# Patient Record
Sex: Male | Born: 1935 | Race: White | Hispanic: No | Smoking: Never smoker
Health system: Southern US, Community
[De-identification: ages and names within clinical notes are randomized; demographics above are authoritative.]

## PROBLEM LIST (undated history)

## (undated) DIAGNOSIS — K219 Gastro-esophageal reflux disease without esophagitis: Secondary | ICD-10-CM

## (undated) DIAGNOSIS — F419 Anxiety disorder, unspecified: Secondary | ICD-10-CM

## (undated) DIAGNOSIS — K5900 Constipation, unspecified: Secondary | ICD-10-CM

## (undated) DIAGNOSIS — N185 Chronic kidney disease, stage 5: Secondary | ICD-10-CM

## (undated) DIAGNOSIS — M1990 Unspecified osteoarthritis, unspecified site: Secondary | ICD-10-CM

## (undated) DIAGNOSIS — F32A Depression, unspecified: Secondary | ICD-10-CM

## (undated) DIAGNOSIS — I272 Pulmonary hypertension, unspecified: Secondary | ICD-10-CM

## (undated) DIAGNOSIS — F329 Major depressive disorder, single episode, unspecified: Secondary | ICD-10-CM

## (undated) DIAGNOSIS — M75100 Unspecified rotator cuff tear or rupture of unspecified shoulder, not specified as traumatic: Secondary | ICD-10-CM

## (undated) DIAGNOSIS — I4510 Unspecified right bundle-branch block: Secondary | ICD-10-CM

## (undated) DIAGNOSIS — I10 Essential (primary) hypertension: Secondary | ICD-10-CM

## (undated) DIAGNOSIS — I4891 Unspecified atrial fibrillation: Secondary | ICD-10-CM

## (undated) DIAGNOSIS — K279 Peptic ulcer, site unspecified, unspecified as acute or chronic, without hemorrhage or perforation: Secondary | ICD-10-CM

## (undated) DIAGNOSIS — R339 Retention of urine, unspecified: Secondary | ICD-10-CM

## (undated) DIAGNOSIS — I639 Cerebral infarction, unspecified: Secondary | ICD-10-CM

## (undated) DIAGNOSIS — I4589 Other specified conduction disorders: Secondary | ICD-10-CM

## (undated) HISTORY — DX: Unspecified atrial fibrillation: I48.91

## (undated) HISTORY — DX: Essential (primary) hypertension: I10

## (undated) HISTORY — DX: Other specified conduction disorders: I45.89

## (undated) HISTORY — DX: Pulmonary hypertension, unspecified: I27.20

## (undated) HISTORY — DX: Unspecified right bundle-branch block: I45.10

## (undated) HISTORY — PX: TONSILLECTOMY: SUR1361

## (undated) HISTORY — DX: Peptic ulcer, site unspecified, unspecified as acute or chronic, without hemorrhage or perforation: K27.9

## (undated) HISTORY — PX: APPENDECTOMY: SHX54

## (undated) HISTORY — DX: Chronic kidney disease, stage 5: N18.5

## (undated) HISTORY — PX: KNEE SURGERY: SHX244

## (undated) HISTORY — PX: EYE SURGERY: SHX253

---

## 2008-02-22 ENCOUNTER — Ambulatory Visit (HOSPITAL_COMMUNITY): Admission: RE | Admit: 2008-02-22 | Discharge: 2008-02-22 | Payer: Self-pay | Admitting: Ophthalmology

## 2010-02-27 ENCOUNTER — Ambulatory Visit (HOSPITAL_COMMUNITY)
Admission: RE | Admit: 2010-02-27 | Discharge: 2010-02-27 | Disposition: A | Discharge status: Home / Self Care | Payer: Medicare Other | Source: Ambulatory Visit | Attending: Pulmonary Disease | Admitting: Pulmonary Disease

## 2010-02-27 ENCOUNTER — Other Ambulatory Visit (HOSPITAL_COMMUNITY): Payer: Self-pay | Admitting: Pulmonary Disease

## 2010-02-27 DIAGNOSIS — R0602 Shortness of breath: Secondary | ICD-10-CM | POA: Insufficient documentation

## 2010-02-27 DIAGNOSIS — R918 Other nonspecific abnormal finding of lung field: Secondary | ICD-10-CM | POA: Insufficient documentation

## 2010-05-28 ENCOUNTER — Ambulatory Visit (INDEPENDENT_AMBULATORY_CARE_PROVIDER_SITE_OTHER): Payer: Medicare Other | Admitting: Urology

## 2010-05-28 DIAGNOSIS — N320 Bladder-neck obstruction: Secondary | ICD-10-CM

## 2010-05-28 DIAGNOSIS — R339 Retention of urine, unspecified: Secondary | ICD-10-CM

## 2010-05-28 DIAGNOSIS — R82998 Other abnormal findings in urine: Secondary | ICD-10-CM

## 2010-05-28 DIAGNOSIS — N401 Enlarged prostate with lower urinary tract symptoms: Secondary | ICD-10-CM

## 2010-07-19 ENCOUNTER — Ambulatory Visit (HOSPITAL_COMMUNITY)
Admission: RE | Admit: 2010-07-19 | Discharge: 2010-07-19 | Disposition: A | Discharge status: Home / Self Care | Payer: Medicare Other | Source: Ambulatory Visit | Attending: Internal Medicine | Admitting: Internal Medicine

## 2010-07-19 DIAGNOSIS — I2510 Atherosclerotic heart disease of native coronary artery without angina pectoris: Secondary | ICD-10-CM | POA: Insufficient documentation

## 2010-07-19 DIAGNOSIS — I369 Nonrheumatic tricuspid valve disorder, unspecified: Secondary | ICD-10-CM

## 2010-07-19 DIAGNOSIS — R609 Edema, unspecified: Secondary | ICD-10-CM | POA: Insufficient documentation

## 2010-07-19 DIAGNOSIS — R0602 Shortness of breath: Secondary | ICD-10-CM | POA: Insufficient documentation

## 2010-12-24 ENCOUNTER — Ambulatory Visit (HOSPITAL_COMMUNITY)
Admission: RE | Admit: 2010-12-24 | Discharge: 2010-12-24 | Disposition: A | Discharge status: Home / Self Care | Payer: Medicare Other | Source: Ambulatory Visit | Attending: Internal Medicine | Admitting: Internal Medicine

## 2010-12-24 ENCOUNTER — Other Ambulatory Visit (HOSPITAL_COMMUNITY): Payer: Self-pay | Admitting: Internal Medicine

## 2010-12-24 DIAGNOSIS — R059 Cough, unspecified: Secondary | ICD-10-CM

## 2010-12-24 DIAGNOSIS — R509 Fever, unspecified: Secondary | ICD-10-CM | POA: Insufficient documentation

## 2010-12-24 DIAGNOSIS — R05 Cough: Secondary | ICD-10-CM | POA: Insufficient documentation

## 2011-01-09 ENCOUNTER — Ambulatory Visit (INDEPENDENT_AMBULATORY_CARE_PROVIDER_SITE_OTHER): Payer: Medicare Other | Admitting: Otolaryngology

## 2011-01-09 DIAGNOSIS — J310 Chronic rhinitis: Secondary | ICD-10-CM

## 2011-01-09 DIAGNOSIS — H6120 Impacted cerumen, unspecified ear: Secondary | ICD-10-CM

## 2011-01-09 DIAGNOSIS — J343 Hypertrophy of nasal turbinates: Secondary | ICD-10-CM

## 2011-02-14 DIAGNOSIS — I4891 Unspecified atrial fibrillation: Secondary | ICD-10-CM

## 2011-02-14 HISTORY — DX: Unspecified atrial fibrillation: I48.91

## 2011-02-27 ENCOUNTER — Encounter: Payer: Self-pay | Admitting: Cardiology

## 2011-02-27 DIAGNOSIS — I4510 Unspecified right bundle-branch block: Secondary | ICD-10-CM | POA: Insufficient documentation

## 2011-02-28 ENCOUNTER — Encounter: Payer: Self-pay | Admitting: Cardiology

## 2011-07-03 ENCOUNTER — Encounter (HOSPITAL_COMMUNITY): Payer: Self-pay | Admitting: Emergency Medicine

## 2011-07-03 ENCOUNTER — Emergency Department (HOSPITAL_COMMUNITY)
Admission: EM | Admit: 2011-07-03 | Discharge: 2011-07-04 | Disposition: A | Discharge status: Home / Self Care | Payer: Medicare Other | Attending: Emergency Medicine | Admitting: Emergency Medicine

## 2011-07-03 ENCOUNTER — Emergency Department (HOSPITAL_COMMUNITY): Payer: Medicare Other

## 2011-07-03 DIAGNOSIS — M25519 Pain in unspecified shoulder: Secondary | ICD-10-CM

## 2011-07-03 DIAGNOSIS — M19019 Primary osteoarthritis, unspecified shoulder: Secondary | ICD-10-CM | POA: Insufficient documentation

## 2011-07-03 DIAGNOSIS — Z87828 Personal history of other (healed) physical injury and trauma: Secondary | ICD-10-CM | POA: Insufficient documentation

## 2011-07-03 MED ORDER — HYDROMORPHONE HCL PF 1 MG/ML IJ SOLN
1.0000 mg | Freq: Once | INTRAMUSCULAR | Status: AC
  Administered 2011-07-03: 1 mg via INTRAVENOUS
  Filled 2011-07-03: qty 1

## 2011-07-03 MED ORDER — ONDANSETRON HCL 4 MG/2ML IJ SOLN
4.0000 mg | Freq: Once | INTRAMUSCULAR | Status: AC
  Administered 2011-07-03: 4 mg via INTRAVENOUS
  Filled 2011-07-03: qty 2

## 2011-07-03 NOTE — ED Notes (Signed)
Pt complains of Right shoulder pain x2 weeks. States he hurt his shoulder when he fell of a tractor and tried to get back up. States he has been seen prior and had imaging done. States the shoulder pain is a chronic issue that he keeps re aggrivating. Pt is AAx4 and NAD noted. Pain 10/10

## 2011-07-03 NOTE — ED Notes (Signed)
Right upper arm is bruised, states he has had fluid drawn off shoulder at least twice by Dr Hilda Lias, saw Dr Hilda Lias today for same and did not get any relief, unable to get an relief at home, family at bedside

## 2011-07-03 NOTE — ED Provider Notes (Signed)
History  Scribed for [employer]. Colon Branch, MD, the patient was seen in room APA19/APA19. This chart was scribed by Candelaria Stagers. The patient's care started at 11:11 PM   CSN: [id number]  Arrival date & time 07/03/11  2205   First MD Initiated Contact with Patient 07/03/11 2303      Chief Complaint  Patient presents with  . Shoulder Pain     The history is provided by the patient.   James Park is a 78 y.o. male who presents to the Emergency Department complaining of right shoulder pain after falling off a tractor three weeks ago and is experiencing worsening pain today.  Pt has been seeing Dr. Hilda Lias for this injury.  Pt saw Dr. Hilda Lias earlier today and was given tramadol which has not helped.  He then took flexural and three advil with no relief.  He is experiencing no other sx.  Pt is scheduled for an MRI later this week.    Past Medical History  Diagnosis Date  . Atrial fibrillation 02/2011    EKG from Dr. Alonza Smoker office reviewed demonstrating AF with ventricular rate of 70 bpm and occasional PVCs versus aberrancy; possible prior septal MI; low voltage, ST-T wave abnormality consistent with LVH or anterolateral ischemia  . Right bundle branch block   . Hypertension     Past Surgical History  Procedure Date  . Knee surgery     tendon repair    No family history on file.  History  Substance Use Topics  . Smoking status: Never Smoker   . Smokeless tobacco: Not on file  . Alcohol Use: Yes      Review of Systems  Constitutional: Negative for fever and diaphoresis.  HENT: Negative for neck pain and neck stiffness.   Eyes: Negative for pain.  Respiratory: Negative for cough and shortness of breath.   Cardiovascular: Negative for chest pain.  Gastrointestinal: Negative for nausea, vomiting, abdominal pain and diarrhea.  Genitourinary: Negative for dysuria.  Musculoskeletal: Positive for arthralgias (right shoulder pain).  Neurological: Negative for headaches.     Allergies  Review of patient's allergies indicates no known allergies.  Home Medications  No current outpatient prescriptions on file.  BP 217/103  Pulse 46  Temp 97.2 F (36.2 C) (Oral)  Resp 16  Ht 5\' 10"  (1.778 m)  Wt 206 lb (93.441 kg)  BMI 29.56 kg/m2  SpO2 99%  Physical Exam  Nursing note and vitals reviewed. Constitutional: He is oriented to person, place, and time. He appears well-developed and well-nourished. No distress.  HENT:  Head: Normocephalic and atraumatic.  Eyes: EOM are normal. Pupils are equal, round, and reactive to light.  Neck: Neck supple. No tracheal deviation present.  Cardiovascular: Normal rate and regular rhythm.   Pulmonary/Chest: Effort normal. No respiratory distress. He has no wheezes.  Abdominal: Soft. He exhibits no distension.  Musculoskeletal: Normal range of motion.       Small area about two cent at top of shoulder that has fresh bruising.  resolving bruises from the clavicle to the left elbow.     Neurological: He is alert and oriented to person, place, and time. No sensory deficit.  Skin: Skin is warm and dry.  Psychiatric: He has a normal mood and affect. His behavior is normal.    ED Course  Procedures   DIAGNOSTIC STUDIES: Oxygen Saturation is 99% on room air, normal by my interpretation.    COORDINATION OF CARE:  11:24PM Ordered: DG Shoulder Right  Dg  Shoulder Right  07/04/2011  *RADIOLOGY REPORT*  Clinical Data: Fall.  Right shoulder pain.  RIGHT SHOULDER - 2+ VIEW  Comparison: None.  Findings: No evidence of fracture or dislocation.  Moderate degenerative changes are seen along the acromioclavicular joint. Mild degenerative spurring also seen involving the glenohumeral joint.  IMPRESSION:  1.  No acute findings. 2.  Moderate acromioclavicular and mild glenohumeral DJD.  Original Report Authenticated By: Danae Orleans, M.D.     MDM  Patient with multiple injuries to the right shoulder recently had an acute fall with  the development of a hemi-arthrosis. He is here with shoulder pain. He is scheduled for an MRI in the next week and is followed by orthopedist Dr. Hilda Lias. Plain films of the shoulder show no fracture or dislocation but moderate acromioclavicular and glenohumeral DJD. Pain is consistent with possible rotator cuff tears. Given analgesics and anti-medic with some relief.Dx testing d/w pt and family.  Questions answered.  Verb understanding, agreeable to d/c home with outpt f/u. Pt feels improved after observation and/or treatment in ED.Pt stable in ED with no significant deterioration in condition.The patient appears reasonably screened and/or stabilized for discharge and I doubt any other medical condition or other [facility] requiring further screening, evaluation, or treatment in the ED at this time prior to discharge.  .I personally performed the services described in this documentation, which was scribed in my presence. The recorded information has been reviewed and considered.   MDM Reviewed: nursing note and vitals Interpretation: x-ray            Nicoletta Dress. Colon Branch, MD 07/04/11 [phone]

## 2011-07-04 MED ORDER — ONDANSETRON HCL 4 MG PO TABS: 4.0000 mg | ORAL_TABLET | Freq: Four times a day (QID) | ORAL | Status: AC

## 2011-07-04 MED ORDER — OXYCODONE-ACETAMINOPHEN 5-325 MG PO TABS: 1.0000 | ORAL_TABLET | ORAL | Status: AC | PRN

## 2011-07-04 NOTE — Discharge Instructions (Signed)
Your x-ray does not show any fractures or breaks in the bone. It does show degenerative disease in the shoulder. Use the pain medicine one half to one pill every 4-6 hours as needed. Followup with Dr. Hilda Lias. Let him make your MRI appointment.

## 2012-03-09 ENCOUNTER — Ambulatory Visit (HOSPITAL_COMMUNITY)
Admission: RE | Admit: 2012-03-09 | Discharge: 2012-03-09 | Disposition: A | Discharge status: Home / Self Care | Payer: Medicare Other | Source: Ambulatory Visit | Attending: Internal Medicine | Admitting: Internal Medicine

## 2012-03-09 DIAGNOSIS — I10 Essential (primary) hypertension: Secondary | ICD-10-CM | POA: Insufficient documentation

## 2012-03-09 DIAGNOSIS — M6281 Muscle weakness (generalized): Secondary | ICD-10-CM | POA: Insufficient documentation

## 2012-03-09 DIAGNOSIS — IMO0001 Reserved for inherently not codable concepts without codable children: Secondary | ICD-10-CM | POA: Insufficient documentation

## 2012-03-09 DIAGNOSIS — R262 Difficulty in walking, not elsewhere classified: Secondary | ICD-10-CM | POA: Insufficient documentation

## 2012-03-09 DIAGNOSIS — I4891 Unspecified atrial fibrillation: Secondary | ICD-10-CM | POA: Insufficient documentation

## 2012-03-09 NOTE — Evaluation (Addendum)
Physical Therapy Evaluation  Patient Details  Name: James Park MRN: [id number] Date of Birth: Feb 26, 1929 Charge:  Eval Today's Date: 03/09/2012 Time: 1478-2956 PT Time Calculation (min): 35 min  Visit#: 1 of 8     Assessment Diagnosis: L leg weakness  Authorization: medicare blue  Authorization Visit#: 1 of 8   Past Medical History:  Past Medical History  Diagnosis Date  . Atrial fibrillation 02/2011    EKG from Dr. Alonza Smoker office reviewed demonstrating AF with ventricular rate of 70 bpm and occasional PVCs versus aberrancy; possible prior septal MI; low voltage, ST-T wave abnormality consistent with LVH or anterolateral ischemia  . Right bundle branch block   . Hypertension    Past Surgical History:  Past Surgical History  Procedure Laterality Date  . Knee surgery      tendon repair    Subjective Symptoms/Limitations Symptoms: James Park states that he has noticed that his L leg has became weaker.  He states that he can tell that it is more difficult for him to get onto his horse  and onto his tractor.  HE states that he tends to take step one at time now.He denies back pain Pertinent History: 1990 pt fell and injured L knee.  How long can you walk comfortably?: The patinet states that he is not walking on a regular basis.  Walking does not seem to bother him though it is more when he as to put more effort in it.   Pain Assessment Currently in Pain?: No/denies   Sensation/Coordination/Flexibility/Functional Tests Flexibility 90/90: Positive  Assessment LLE Strength Left Hip Flexion: 5/5 Left Hip Extension: 3+/5 (B) Left Hip ABduction: 4/5 Left Hip ADduction: 5/5 Left Knee Flexion: 4/5 (B 4/5) Left Knee Extension: 3+/5 Left Ankle Dorsiflexion: 5/5 Left Ankle Plantar Flexion:  (4-/5)  Exercise/Treatments Mobility/Balance  Static Standing Balance Single Leg Stance - Right Leg: 3 Single Leg Stance - Left Leg: 1   Stretches Active Hamstring Stretch: 3  reps;30 seconds Seated Long Arc Quad: 10 reps Sidelying Hip ABduction: 10 reps Prone  Hip Extension: Both;10 reps    Physical Therapy Assessment and Plan PT Assessment and Plan Clinical Impression Statement: Pt who has decreased L LE strength and decrased balance who is noticing increased difficulty wilth activities that require him to push his body weight up.  Pt will benfit from skilled PT to improve strength and functional mobility for higher level activitiy such as climbing into a tractor. Pt will benefit from skilled therapeutic intervention in order to improve on the following deficits: Decreased balance;Decreased strength;Impaired flexibility Rehab Potential: Good PT Frequency: Min 2X/week PT Duration: 4 weeks PT Treatment/Interventions: Therapeutic exercise;Therapeutic activities;Balance training PT Plan: begin bike on L2, lateral step up, forward step up, rockerboard, terminal extension both standing and supine, L heelraise and functional squats;  3rd treatement add step downs; SLS, and wall squats.    Goals Home Exercise Program Pt will Perform Home Exercise Program: Independently PT Short Term Goals Time to Complete Short Term Goals: 2 weeks PT Short Term Goal 1: Pt to increase LE strength 1/2 grade PT Short Term Goal 2: able to take steps reciprocally PT Long Term Goals Time to Complete Long Term Goals: 4 weeks PT Long Term Goal 1: I in advance HEP PT Long Term Goal 2: able to get into a saddle without difficulty Long Term Goal 3: able to get into a tractor without difficulty  Problem List Patient Active Problem List  Diagnosis  . Atrial fibrillation  .  Right bundle branch block    General Behavior During Session: [facility] for tasks performed Cognition: [facility] for tasks performed PT Plan of Care PT Home Exercise Plan: given  [name]: clinical judgement. Functional Limitation: Changing and maintaining body position Changing and Maintaining  Body Position Current Status [phone]): At least 40 percent but less than 60 percent impaired, limited or restricted Changing and Maintaining Body Position Goal Status (A2130): At least 1 percent but less than 20 percent impaired, limited or restricted  Monico Sudduth,[name] 03/09/2012, 1:54 PM  Physician Documentation Your signature is required to indicate approval of the treatment plan as stated above.  Please sign and either send electronically or make a copy of this report for your files and return this physician signed original.   Please mark one 1.__approve of plan  2. ___approve of plan with the following conditions.   ______________________________                                                          _____________________ Physician Signature                                                                                                             Date

## 2012-03-16 ENCOUNTER — Ambulatory Visit (HOSPITAL_COMMUNITY)
Admission: RE | Admit: 2012-03-16 | Discharge: 2012-03-16 | Disposition: A | Discharge status: Home / Self Care | Payer: Medicare Other | Source: Ambulatory Visit | Attending: Internal Medicine | Admitting: Internal Medicine

## 2012-03-16 ENCOUNTER — Telehealth (HOSPITAL_COMMUNITY): Payer: Self-pay | Admitting: Physical Therapy

## 2012-03-16 DIAGNOSIS — M6281 Muscle weakness (generalized): Secondary | ICD-10-CM | POA: Insufficient documentation

## 2012-03-16 DIAGNOSIS — I10 Essential (primary) hypertension: Secondary | ICD-10-CM | POA: Insufficient documentation

## 2012-03-16 DIAGNOSIS — I4891 Unspecified atrial fibrillation: Secondary | ICD-10-CM | POA: Insufficient documentation

## 2012-03-16 DIAGNOSIS — IMO0001 Reserved for inherently not codable concepts without codable children: Secondary | ICD-10-CM | POA: Insufficient documentation

## 2012-03-16 DIAGNOSIS — R262 Difficulty in walking, not elsewhere classified: Secondary | ICD-10-CM | POA: Insufficient documentation

## 2012-03-16 NOTE — Progress Notes (Signed)
out Physical Therapy Treatment Patient Details  Name: James Park MRN: [id number] Date of Birth: Apr 04, 1929  Today's Date: 03/16/2012 Time: 4098-1191 PT Time Calculation (min): 46 min  Visit#: 2 of 88  Re-eval: 03/30/12  charge:  There ex x 42  Authorization: medicare blue  Authorization Visit#: 2 of 8   Subjective: Symptoms/Limitations Symptoms: Pt states that he has not been doing his exercises on a regular basis due to being busy.    Exercise/Treatments  Stretches Active Hamstring Stretch: 3 reps;30 seconds Aerobic Stationary Bike: 7' @ 2.0 Standing Heel Raises: 10 reps Terminal Knee Extension: 10 reps Lateral Step Up: 10 reps Forward Step Up: 10 reps Functional Squat: 10 reps Rocker Board: 1 minute Seated Long Arc Quad: Left;10 reps;Weights Long Arc Quad Weight: 3 lbs. Supine Terminal Knee Extension: 10 reps Straight Leg Raises: 10 reps Sidelying Hip ABduction: Left;10 reps;Limitations Hip ABduction Limitations: 3# Prone  Hip Extension: Both;10 reps    Physical Therapy Assessment and Plan PT Assessment and Plan Clinical Impression Statement: Pt continues to have decreased strength in L LE;  Pt encouraged to complete HEP.  Added new exercises without difficulty. Pt will benefit from skilled therapeutic intervention in order to improve on the following deficits: Decreased balance;Decreased strength;Impaired flexibility Rehab Potential: Good PT Frequency: Min 2X/week PT Duration: 4 weeks PT Treatment/Interventions: Therapeutic exercise;Therapeutic activities;Balance training PT Plan:  3rd treatement add step downs; SLS, and wall squats.    Goals  progressing  Problem List Patient Active Problem List  Diagnosis  . Atrial fibrillation  . Right bundle branch block    General Behavior During Session: [name] for tasks performed Cognition: [name] for tasks performed  [name]: clinical judgement; LEFS Changing and Maintaining  Body Position Current Status ([id number]): At least 40 percent but less than 60 percent impaired, limited or restricted  [name],[name] 03/16/2012, 3:43 PM

## 2012-03-19 ENCOUNTER — Inpatient Hospital Stay (HOSPITAL_COMMUNITY): Admission: RE | Admit: 2012-03-19 | Payer: Medicare Other | Source: Ambulatory Visit | Admitting: *Deleted

## 2012-03-23 ENCOUNTER — Ambulatory Visit (HOSPITAL_COMMUNITY)
Admission: RE | Admit: 2012-03-23 | Discharge: 2012-03-23 | Disposition: A | Discharge status: Home / Self Care | Payer: Medicare Other | Source: Ambulatory Visit | Attending: Internal Medicine | Admitting: Internal Medicine

## 2012-03-23 NOTE — Progress Notes (Signed)
Physical Therapy Treatment Patient Details  Name: James Park MRN: [id number] Date of Birth: Jun 04, 1929  Today's Date: 03/23/2012 Time: 1112-1203 PT Time Calculation (min): 51 min Charge: Therex 40', self care 8'  Visit#: 3 of 8  Re-eval: 03/30/12 Assessment Diagnosis: L leg weakness Next MD Visit: Ouida Sills- June  Authorization: medicare blue  Authorization Time Period:    Authorization Visit#: 3 of 8   Subjective: Symptoms/Limitations Symptoms: Pt reported improved confidence walking up hill this weekend, feels he is getting stronger.  Pt encouraged to increase frequency with HEP for maximum benefits Pain Assessment Currently in Pain?: No/denies  Objective:   Exercise/Treatments Stretches Active Hamstring Stretch: 3 reps;30 seconds Aerobic Stationary Bike: 6'@ 3.0 Standing Heel Raises: 15 reps;Limitations Heel Raises Limitations: toe raises 15x Lateral Step Up: Left;10 reps;Hand Hold: 1;Step Height: 4" Forward Step Up: Left;15 reps;Hand Hold: 1;Step Height: 4" Step Down: Left;10 reps;Hand Hold: 1;Step Height: 4" Wall Squat: 10 reps;5 seconds Rocker Board: 2 minutes;Limitations Rocker Board Limitations: R/L SLS: 2x 30" with 1 finger HHA Seated Other Seated Knee Exercises: STS w/o HHA Supine Bridges: 15 reps Straight Leg Raises: 15 reps    Physical Therapy Assessment and Plan PT Assessment and Plan Clinical Impression Statement: Added stair training for LE strengthening and progressed balance activities with min assistance required for LOB episodes. and vc-ing to improve spatial awareness   Pt limited by fatigue at end of activiities but no c/o pain through session.  Pt encouraged to increase frequency with HEP for maximum benefits and given new worksheet with supine and standing therex PT Plan: Continue with current POC, progress LE strength and balance     Goals    Problem List Patient Active Problem List  Diagnosis  . Atrial fibrillation  . Right bundle  branch block    PT - End of Session Activity Tolerance: Patient tolerated treatment well General Behavior During Session: [facility] for tasks performed Cognition: [facility] for tasks performed PT Plan of Care PT Home Exercise Plan: HEP given today following discussion with HEP; ex included bridging, SLR, wall slide, SLS, heel and toe raises  GP    Juel Burrow 03/23/2012, 12:08 PM

## 2012-03-26 ENCOUNTER — Ambulatory Visit (HOSPITAL_COMMUNITY)
Admission: RE | Admit: 2012-03-26 | Discharge: 2012-03-26 | Disposition: A | Discharge status: Home / Self Care | Payer: Medicare Other | Source: Ambulatory Visit | Attending: Internal Medicine | Admitting: Internal Medicine

## 2012-03-26 NOTE — Progress Notes (Signed)
Physical Therapy Treatment Patient Details  Name: REHAN HOLNESS MRN: [id number] Date of Birth: 09-29-1929  Today's Date: 03/26/2012 Time: 1102-1152 PT Time Calculation (min): 50 min Charge: therex 50'  Visit#: 4 of 8  Re-eval:   Assessment Diagnosis: L leg weakness Next MD Visit: Ouida Sills- June  Authorization: medicare blue  Authorization Time Period:    Authorization Visit#: 4 of 8   Subjective: Symptoms/Limitations Symptoms: Pt reported power outage at home, is tired today.  No c/o pain Pain Assessment Currently in Pain?: No/denies  Objective:   Exercise/Treatments Aerobic Stationary Bike: NuStep 8' @ L2 Machines for Strengthening Cybex Knee Extension: 1 PL L LE 10x Cybex Knee Flexion: 3PL L LE 10x Standing Heel Raises: 15 reps;Limitations Heel Raises Limitations: toe raises 15x Lateral Step Up: Left;15 reps;Hand Hold: 1;Step Height: 4" Forward Step Up: Left;15 reps;Hand Hold: 1;Step Height: 4" Step Down: Left;10 reps;Hand Hold: 1;Step Height: 4" Wall Squat: 10 reps;10 seconds Lunge Walking - Round Trips:    Manufacturing systems engineer: 2 minutes;Limitations Manufacturing systems engineer Limitations: R/L Other Standing Knee Exercises: heel and toe walking 1RT Other Standing Knee Exercises: chair pose 5x 10" Seated Other Seated Knee Exercises: 5 STS w/o HHA  Physical Therapy Assessment and Plan PT Assessment and Plan Clinical Impression Statement: Progressed therex for LE strengthening, added chair pose and cybex machines for quad strengthening.  Pt continues to demonstrate weak eccentric quad control while descending stairs.  Musculature fatigue noted with new activities, no c/o pain through session.   PT Plan: Continue with current POC, progress LE strength and balance     Goals    Problem List Patient Active Problem List  Diagnosis  . Atrial fibrillation  . Right bundle branch block    PT - End of Session Activity Tolerance: Patient tolerated treatment well General Behavior During  Session: [facility] for tasks performed Cognition: [facility] for tasks performed  GP    Juel Burrow 03/26/2012, 12:12 PM

## 2012-03-30 ENCOUNTER — Ambulatory Visit (HOSPITAL_COMMUNITY)
Admission: RE | Admit: 2012-03-30 | Discharge: 2012-03-30 | Disposition: A | Discharge status: Home / Self Care | Payer: Medicare Other | Source: Ambulatory Visit | Attending: Internal Medicine | Admitting: Internal Medicine

## 2012-03-30 NOTE — Progress Notes (Signed)
Physical Therapy Treatment Patient Details  Name: James Park MRN: [id number] Date of Birth: 08-Aug-1929 Charge:  There ex x 42 Today's Date: 03/30/2012 Time: 1102-1148 PT Time Calculation (min): 46 min  Visit#: 5 of 8  Re-eval: 04/07/12   Authorization: medicare blue  Authorization Time Period:    Authorization Visit#: 5 of 8  Subjective: Symptoms/Limitations Symptoms: Pt states he was very sore for over two days after last treatment.  Pt feels the step downs contributed to this.  Pain Assessment Currently in Pain?: No/denies   Exercise/Treatments Gastroc Stretch:  (slant board 30" x 3) Aerobic Stationary Bike: NuStep 8' @ L2 (Simultaneous filing. User may not have seen previous data.) Machines for Strengthening Cybex Knee Extension: 1 PL L LE x 10  Cybex Knee Flexion: 3Pl L LEx 10 Cybex Leg Press: 4 Pl L LE  x 10 Standing Heel Raises: 15 reps;Limitations Lateral Step Up: Left;10 reps;Hand Hold: 1;Step Height: 4" Forward Step Up: Left;10 reps;Hand Hold: 1;Step Height: 4" Functional Squat: 10 reps Lunge Walking - Round Trips:    Manufacturing systems engineer: 2 minutes;Limitations Manufacturing systems engineer Limitations: R/L SLS: 5 x each leg x 10" two finger hold Seated Other Seated Knee Exercises: 5 STS w/o HHA    Physical Therapy Assessment and Plan PT Assessment and Plan Clinical Impression Statement: added leg press to routine.  Pt has significant decreased balance will work on balance each treatment from now on. PT Frequency: Min 2X/week PT Treatment/Interventions: Therapeutic exercise;Therapeutic activities;Balance training PT Plan: Continue with current POC, progress LE strength and balance     Goals Home Exercise Program PT Goal: Perform Home Exercise Program - Progress: Met PT Short Term Goals PT Short Term Goal 1 - Progress: Progressing toward goal PT Short Term Goal 2 - Progress: Progressing toward goal PT Long Term Goals PT Long Term Goal 1 - Progress: Met PT Long Term Goal 2 -  Progress: Not met Long Term Goal 3 Progress: Not met  Problem List Patient Active Problem List  Diagnosis  . Atrial fibrillation  . Right bundle branch block    PT - End of Session Activity Tolerance: Patient tolerated treatment well General Behavior During Session: [facility] for tasks performed Cognition: [facility] for tasks performed  GP    Arthelia Callicott,[name] 03/30/2012, 11:55 AM

## 2012-04-02 ENCOUNTER — Ambulatory Visit (HOSPITAL_COMMUNITY)
Admission: RE | Admit: 2012-04-02 | Discharge: 2012-04-02 | Disposition: A | Discharge status: Home / Self Care | Payer: Medicare Other | Source: Ambulatory Visit | Attending: Internal Medicine | Admitting: Internal Medicine

## 2012-04-02 DIAGNOSIS — R2689 Other abnormalities of gait and mobility: Secondary | ICD-10-CM | POA: Insufficient documentation

## 2012-04-02 NOTE — Evaluation (Addendum)
Physical Therapy Evaluation  Patient Details  Name: James Park MRN: [id number] Date of Birth: [birth year] Charge:  There ex x 22; self care x 12; mm test Today's Date: 04/02/2012 Time: 1100-1152 PT Time Calculation (min): 52 min              Visit#: 6 of 8  Re-eval: 05/02/12 Assessment Diagnosis: L leg weakness Next MD Visit: Ouida Sills- June  Authorization: medicare blue    Authorization Visit#: 6 of 8   Past Medical History:  Past Medical History  Diagnosis Date  . Atrial fibrillation 02/2011    EKG from Dr. Alonza Smoker office reviewed demonstrating AF with ventricular rate of 70 bpm and occasional PVCs versus aberrancy; possible prior septal MI; low voltage, ST-T wave abnormality consistent with LVH or anterolateral ischemia  . Right bundle branch block   . Hypertension    Past Surgical History:  Past Surgical History  Procedure Laterality Date  . Knee surgery      tendon repair    Subjective Symptoms/Limitations Pertinent History: 1990 pt fell and injured L knee.  How long can you walk comfortably?: Pt states he has started to walk for exercise he is going about 1/4 a mile.   Pt was not walking for exercise. Pain Assessment Currently in Pain?: No/denies   Sensation/Coordination/Flexibility/Functional Tests Functional Tests Functional Tests: LEFS was 33 now 32/80  Assessment LLE Strength Left Hip Flexion: 5/5 Left Hip Extension: 4/5 ( was 3+/5) Left Hip ABduction:  (4+/5 was 4/5) Left Hip ADduction: 5/5 Left Knee Flexion: 5/5 (was 4/5) Left Knee Extension: 5/5 (was 3+/5) Left Ankle Dorsiflexion: 5/5 Left Ankle Plantar Flexion: 4/5 (was 4-/5)  Exercise/Treatments Mobility/Balance  Static Standing Balance Single Leg Stance - Right Leg: 5 (was 3) Single Leg Stance - Left Leg: 2 (was 1)   Balance Exercises Standing SLS: Eyes open;5 reps Tandem Gait: Forward;1 rep Retro Gait: 1 rep Sidestepping: 1 rep Heel Raises: Both;10 reps Other Standing Exercises:  functional squat no hand hold x 10 Other Standing Exercises: steps x 1 flight     Seated Other Seated Exercises: Nu step L1 6:30 Other Seated Exercises: sit to stand one hand hold x 10     Physical Therapy Assessment and Plan PT Assessment and Plan Clinical Impression Statement: Pt Lt LE strength has improved significantly but pt has not improved in balance.  Pt limitation in balance is affecting his functional ability.  Will continue to see but will change focus from strengthening to balance. Rehab Potential: Good PT Plan: Pt treatment to focus more on balance.  Next treatment begin 1-15 on foam, foam cone rotation.     Continue treatment two times a week for four weeks Goals Home Exercise Program Pt will Perform Home Exercise Program: Independently PT Goal: Perform Home Exercise Program - Progress: Met PT Short Term Goals PT Short Term Goal 1: Pt to increase LE strength 1/2 grade PT Short Term Goal 1 - Progress: Met PT Short Term Goal 2: able to take steps reciprocally PT Short Term Goal 2 - Progress: Met PT Long Term Goals Time to Complete Long Term Goals: 4 weeks PT Long Term Goal 1: I in advance HEP PT Long Term Goal 1 - Progress: Met PT Long Term Goal 2: able to get into a saddle without difficulty (unknown weather has not permitted) Long Term Goal 3: able to get into a tractor without difficulty Long Term Goal 3 Progress:  (unknown weather has not permitted)  Problem List Patient Active Problem  List  Diagnosis  . Atrial fibrillation  . Right bundle branch block  . Balance problem    PT - End of Session Equipment Utilized During Treatment: Gait belt PT Plan of Care PT Patient Instructions: spoke to patient about increasing distance walked as pt is only walking about 1/4 mile.  Spoke to pt about focusing on SLS, sit to stand at bed and minisquats making sure pt did not come down far enough to cause his R knee to begin to hurt.  [name]:  clinical judgement; LEFS Functional Limitation: Changing and maintaining body position Changing and Maintaining Body Position Current Status (Z6109): At least 40 percent but less than 60 percent impaired, limited or restricted Changing and Maintaining Body Position Goal Status (U0454): At least 1 percent but less than 20 percent impaired, limited or restricted  [name],[name] 04/02/2012, 12:05 PM  Physician Documentation Your signature is required to indicate approval of the treatment plan as stated above.  Please sign and either send electronically or make a copy of this report for your files and return this physician signed original.   Please mark one 1.__approve of plan  2. ___approve of plan with the following conditions.   ______________________________                                                          _____________________ Physician Signature                                                                                                             Date

## 2012-04-06 ENCOUNTER — Ambulatory Visit (HOSPITAL_COMMUNITY)
Admission: RE | Admit: 2012-04-06 | Discharge: 2012-04-06 | Disposition: A | Discharge status: Home / Self Care | Payer: Medicare Other | Source: Ambulatory Visit | Attending: Internal Medicine | Admitting: Internal Medicine

## 2012-04-06 NOTE — Progress Notes (Signed)
Physical Therapy Treatment Patient Details  Name: James Park MRN: [id number] Date of Birth: [birth year]  Today's Date: 04/06/2012 Time: 4098-1191 PT Time Calculation (min): 49 min  Visit#: 7 of 16  Re-eval: 05/02/12 Authorization: medicare blue  Authorization Visit#: 7 of 16  Charges:  therex 26', NMR 18'  Subjective: Symptoms/Limitations Symptoms: Pt. states he is doing well today.  Reports no pain today and has been compliant with HEP. Pain Assessment Currently in Pain?: No/denies   Exercise/Treatments Balance Exercises Standing SLS: Eyes open;5 reps Tandem Gait: Forward;2 reps Retro Gait: 2 reps Numbers 1-15: Foam;1 rep Heel Raises: Both;10 reps;Limitations Heel Raises Limitations: no UE's Toe Raise: 10 reps;Limitations Toe Raise Limitations: no UE's Sit to Stand: Standard surface;Limitations Sit to Stand Limitations: 7X no UE's Other Standing Exercises: functional squat no hand hold x 10 Other Standing Exercises: steps x 1 flight  Seated Other Seated Exercises: Nu step L2 6:30, seat 10    Physical Therapy Assessment and Plan PT Assessment and Plan Clinical Impression Statement: Progressed balance activities with tandem being most difficult.  Standing number retrieval on balance beam also challenging.  Pt. required several rest breaks during session due to fatigue.  No significant LOB and able to self correct. PT Plan: Continue treatment to focus more on balance.  Next treatment add foam cone rotation.       Problem List Patient Active Problem List  Diagnosis  . Atrial fibrillation  . Right bundle branch block  . Balance problem      Lurena Nida, PTA/CLT 04/06/2012, 12:08 PM

## 2012-04-09 ENCOUNTER — Ambulatory Visit (HOSPITAL_COMMUNITY)
Admission: RE | Admit: 2012-04-09 | Discharge: 2012-04-09 | Disposition: A | Discharge status: Home / Self Care | Payer: Medicare Other | Source: Ambulatory Visit | Attending: Internal Medicine | Admitting: Internal Medicine

## 2012-04-09 DIAGNOSIS — R2689 Other abnormalities of gait and mobility: Secondary | ICD-10-CM

## 2012-04-09 NOTE — Progress Notes (Signed)
Physical Therapy Treatment Patient Details  Name: James Park MRN: [id number] Date of Birth: June 16, 1929 Charge:  NMR x 29; There ex 10 Today's Date: 04/09/2012 Time: 4098-1191 PT Time Calculation (min): 42 min  Visit#: 8 of 16  Re-eval: 04/30/12    Authorization: medicare blue  Authorization Visit#: 8 of 16   Subjective: Symptoms/Limitations Symptoms: Pt states he is amazed at how weak his legs are.  Exercise/Treatments   Standing SLS: Eyes open;5 reps Tandem Gait: Forward;2 reps Retro Gait: 2 reps Sidestepping: 2 reps;Theraband Theraband Level (Sidestepping): Level 4 (Blue) Numbers 1-15: Foam;1 rep Cone Rotation: Foam;Right turn;Left turn Marching: 10 reps Other Standing Exercises: functional squat no hand hold x 10 Other Standing Exercises: steps x 1 flight     Seated Other Seated Exercises: Nu step L2 6:30, seat 10    Physical Therapy Assessment and Plan PT Assessment and Plan Clinical Impression Statement: Pt needs contact guard assist to complete balance activities.  Pt needs verbal cuing to keep shoulders from going back with new marching exercise.  Added cone rotation without difficulty.  Pt only needed one rest break today. Pt will benefit from skilled therapeutic intervention in order to improve on the following deficits: Decreased balance;Decreased strength;Impaired flexibility PT Frequency: Min 2X/week PT Duration: 4 weeks PT Treatment/Interventions: Therapeutic exercise;Therapeutic activities;Balance training PT Plan: add wall bumps next treatment.       Problem List Patient Active Problem List  Diagnosis  . Atrial fibrillation  . Right bundle branch block  . Balance problem    PT - End of Session Equipment Utilized During Treatment: Gait belt Activity Tolerance: Patient tolerated treatment well General Behavior During Session: [facility] for tasks performed Cognition: [facility] for tasks performed  [name]: clinical  judgement; LEFS  [name],[name] 04/09/2012, 9:51 AM

## 2012-04-13 ENCOUNTER — Ambulatory Visit (HOSPITAL_COMMUNITY)
Admission: RE | Admit: 2012-04-13 | Discharge: 2012-04-13 | Disposition: A | Discharge status: Home / Self Care | Payer: Medicare Other | Source: Ambulatory Visit | Attending: Internal Medicine | Admitting: Internal Medicine

## 2012-04-13 ENCOUNTER — Ambulatory Visit (HOSPITAL_COMMUNITY): Payer: Medicare Other | Admitting: Physical Therapy

## 2012-04-13 DIAGNOSIS — I10 Essential (primary) hypertension: Secondary | ICD-10-CM | POA: Insufficient documentation

## 2012-04-13 DIAGNOSIS — I4891 Unspecified atrial fibrillation: Secondary | ICD-10-CM | POA: Insufficient documentation

## 2012-04-13 DIAGNOSIS — R2689 Other abnormalities of gait and mobility: Secondary | ICD-10-CM

## 2012-04-13 DIAGNOSIS — R262 Difficulty in walking, not elsewhere classified: Secondary | ICD-10-CM | POA: Insufficient documentation

## 2012-04-13 DIAGNOSIS — M6281 Muscle weakness (generalized): Secondary | ICD-10-CM | POA: Insufficient documentation

## 2012-04-13 DIAGNOSIS — IMO0001 Reserved for inherently not codable concepts without codable children: Secondary | ICD-10-CM | POA: Insufficient documentation

## 2012-04-13 NOTE — Progress Notes (Signed)
Physical Therapy Treatment Patient Details  Name: James Park MRN: [id number] Date of Birth: 1929/04/18  Today's Date: 04/13/2012 Time: 4696-2952 PT Time Calculation (min): 55 min Charge: therex 23', NMR 30'  Visit#: 9 of 1  Re-eval: 04/30/12    Authorization: medicare blue  Authorization Time Period:    Authorization Visit#: 9 of 16   Subjective: Symptoms/Limitations Symptoms: Pt stated he was tired today, has difficulty sleeping last night.  No pain. Pain Assessment Currently in Pain?: No/denies  Objective:   Exercise/Treatments Aerobic Stationary Bike: NuStep 10' @ L2 SPM >100 Standing Heel Raises: Limitations Heel Raises Limitations: heel and toe walking 2RT Functional Squat: 15 reps SLS: 5 x each leg x 10" two finger hold   Balance Exercises Standing Wall Bumps: Shoulder;Hips;15 reps Tandem Gait: Forward;2 reps Retro Gait: 2 reps Numbers 1-15: Foam;1 rep Cone Rotation: Foam;Right turn;Left turn Marching: 10 reps   Physical Therapy Assessment and Plan PT Assessment and Plan Clinical Impression Statement: Added wall bumps to improve proprioception for improve finding COG, pt required min assistance with shoulder wall bumps.  Pt with CGA with balance activities and vc-ing to improve spatial awareness.  Pt limited by fatigue but did not require any rest breaks today. PT Plan: Continue with current POC.  Resume cybex machines next session for strengthening and progress to dynamic surfaces when ready.    Goals    Problem List Patient Active Problem List  Diagnosis  . Atrial fibrillation  . Right bundle branch block  . Balance problem    PT - End of Session Equipment Utilized During Treatment: Gait belt Activity Tolerance: Patient tolerated treatment well;Patient limited by fatigue General Behavior During Session: [facility] for tasks performed Cognition: [facility] for tasks performed  GP    Juel Burrow 04/13/2012, 5:08 PM

## 2012-04-16 ENCOUNTER — Ambulatory Visit (HOSPITAL_COMMUNITY): Payer: Medicare Other | Admitting: *Deleted

## 2012-04-16 ENCOUNTER — Ambulatory Visit (HOSPITAL_COMMUNITY)
Admission: RE | Admit: 2012-04-16 | Discharge: 2012-04-16 | Disposition: A | Discharge status: Home / Self Care | Payer: Medicare Other | Source: Ambulatory Visit | Attending: Internal Medicine | Admitting: Internal Medicine

## 2012-04-16 NOTE — Progress Notes (Signed)
Physical Therapy Treatment Patient Details  Name: AYDYN TESTERMAN MRN: [id number] Date of Birth: 01-14-1930  Today's Date: 04/16/2012 Time: 7829-5621 PT Time Calculation (min): 43 min  Visit#: 10 of 16  Re-eval: 04/30/12 Charges: Therex x 40'  Authorization: medicare blue  Authorization Visit#: 10 of 16   Subjective: Symptoms/Limitations Symptoms: Pt reports continued HEP compliance. Pain Assessment Currently in Pain?: No/denies   Exercise/Treatments Machines for Strengthening Cybex Knee Extension: 1 PL L LE x 15 Cybex Knee Flexion: 3Pl L LEx 15 Standing Heel Raises Limitations: heel and toe walking 2RT Functional Squat: 15 reps Rocker Board: 2 minutes;Limitations SLS: 5 x each leg x 10" two finger hold Wall bumps shoulders and hips x 10 each   Physical Therapy Assessment and Plan PT Assessment and Plan Clinical Impression Statement: Resumed LE strengthening on cybex this session with visible mm fatigue. Pt require multimodal cueing with functional squats to improve form. Pt requires one seated rest break during session. Pt reports 0/10 pain at end of session. PT Plan: Continue to progress balance, activity tolerance, and LE strength per PT POC.     Problem List Patient Active Problem List  Diagnosis  . Atrial fibrillation  . Right bundle branch block  . Balance problem    PT - End of Session Equipment Utilized During Treatment: Gait belt Activity Tolerance: Patient tolerated treatment well;Patient limited by fatigue General Behavior During Session: [facility] for tasks performed Cognition: [facility] for tasks performed  Seth Bake, PTA  04/16/2012, 12:41 PM

## 2012-04-20 ENCOUNTER — Ambulatory Visit (HOSPITAL_COMMUNITY): Payer: Medicare Other | Admitting: *Deleted

## 2012-04-20 ENCOUNTER — Ambulatory Visit (HOSPITAL_COMMUNITY)
Admission: RE | Admit: 2012-04-20 | Discharge: 2012-04-20 | Disposition: A | Discharge status: Home / Self Care | Payer: Medicare Other | Source: Ambulatory Visit | Attending: Internal Medicine | Admitting: Internal Medicine

## 2012-04-20 DIAGNOSIS — R2689 Other abnormalities of gait and mobility: Secondary | ICD-10-CM

## 2012-04-20 NOTE — Progress Notes (Signed)
Physical Therapy Treatment Patient Details  Name: James Park MRN: [id number] Date of Birth: 10-02-1929  Today's Date: 04/20/2012 Time: 4098-1191 PT Time Calculation (min): 42 min Charge: Manual 15', Gait 15', therex 10'  Visit#: 11 of 16  Re-eval: 04/30/12    Authorization: medicare blue  Authorization Time Period:    Authorization Visit#: 11 of 16   Subjective: Symptoms/Limitations Symptoms: Pt reported he couldn't walk over weekend, Lt knee pain currently 7/10 with analgic gait mechanics at entrance.   Pain Assessment Currently in Pain?: Yes Pain Score:   7 Pain Location: Knee Pain Orientation: Left  Objective:  Exercise/Treatments Stretches Passive Hamstring Stretch: 3 reps;30 seconds Gastroc Stretch: 3 reps;30 seconds;Other (comment) (slant board) Aerobic Tread Mill: Gait trainer x 8 min total w/ concentration on heel to toe and toe push off;  4'30" rest break then 3'30" Standing Gait Training: 6 RT with concentration on heel to toe and toe push off   Manual Therapy Manual Therapy: Joint mobilization Joint Mobilization: patella mobs all direction, tib/fib, Lt knee distraction with MFR to lateral knee  Physical Therapy Assessment and Plan PT Assessment and Plan Clinical Impression Statement: Manual techniques initial this session for improved joint mobility and pain reduction.  Session focus on improving gait mechanics, pt with multimodal cueing for increased stirde length, heel to toe pattern and toe push off.  End of session pt reported no real pain just faitgue. PT Plan: Next session begin on TM for improved gait mechanics,  Continue to progress balance, activity tolerance, and LE strength per PT POC.    Goals    Problem List Patient Active Problem List  Diagnosis  . Atrial fibrillation  . Right bundle branch block  . Balance problem    PT - End of Session Activity Tolerance: Patient tolerated treatment well General Behavior During Session: [facility]  for tasks performed Cognition: [facility] for tasks performed  GP    Juel Burrow 04/20/2012, 3:01 PM

## 2012-04-23 ENCOUNTER — Ambulatory Visit (HOSPITAL_COMMUNITY): Payer: Medicare Other

## 2012-04-23 ENCOUNTER — Ambulatory Visit (HOSPITAL_COMMUNITY): Payer: Medicare Other | Admitting: Physical Therapy

## 2012-04-27 ENCOUNTER — Ambulatory Visit (HOSPITAL_COMMUNITY): Payer: Medicare Other

## 2012-04-27 ENCOUNTER — Ambulatory Visit (HOSPITAL_COMMUNITY): Payer: Medicare Other | Admitting: *Deleted

## 2012-04-30 ENCOUNTER — Ambulatory Visit (HOSPITAL_COMMUNITY): Payer: Medicare Other

## 2012-05-04 ENCOUNTER — Ambulatory Visit (HOSPITAL_COMMUNITY): Payer: Medicare Other | Admitting: Physical Therapy

## 2012-05-07 ENCOUNTER — Ambulatory Visit (HOSPITAL_COMMUNITY): Payer: Medicare Other | Admitting: Physical Therapy

## 2012-05-13 DIAGNOSIS — I639 Cerebral infarction, unspecified: Secondary | ICD-10-CM

## 2012-05-13 HISTORY — DX: Cerebral infarction, unspecified: I63.9

## 2012-06-10 ENCOUNTER — Other Ambulatory Visit (HOSPITAL_COMMUNITY): Payer: Self-pay | Admitting: Internal Medicine

## 2012-06-10 ENCOUNTER — Ambulatory Visit (HOSPITAL_COMMUNITY)
Admission: RE | Admit: 2012-06-10 | Discharge: 2012-06-10 | Disposition: A | Discharge status: Home / Self Care | Payer: Medicare Other | Source: Ambulatory Visit | Attending: Internal Medicine | Admitting: Internal Medicine

## 2012-06-10 DIAGNOSIS — I639 Cerebral infarction, unspecified: Secondary | ICD-10-CM

## 2012-06-11 ENCOUNTER — Encounter (HOSPITAL_COMMUNITY): Payer: Self-pay | Admitting: *Deleted

## 2012-06-11 ENCOUNTER — Emergency Department (HOSPITAL_COMMUNITY): Payer: Medicare Other

## 2012-06-11 ENCOUNTER — Inpatient Hospital Stay (HOSPITAL_COMMUNITY)
Admission: EM | Admit: 2012-06-11 | Discharge: 2012-06-13 | DRG: 682 | Disposition: A | Discharge status: Home / Self Care | Payer: Medicare Other | Attending: Internal Medicine | Admitting: Internal Medicine

## 2012-06-11 DIAGNOSIS — Y92009 Unspecified place in unspecified non-institutional (private) residence as the place of occurrence of the external cause: Secondary | ICD-10-CM

## 2012-06-11 DIAGNOSIS — S41109A Unspecified open wound of unspecified upper arm, initial encounter: Secondary | ICD-10-CM | POA: Diagnosis present

## 2012-06-11 DIAGNOSIS — N179 Acute kidney failure, unspecified: Secondary | ICD-10-CM

## 2012-06-11 DIAGNOSIS — N390 Urinary tract infection, site not specified: Secondary | ICD-10-CM

## 2012-06-11 DIAGNOSIS — I4891 Unspecified atrial fibrillation: Secondary | ICD-10-CM | POA: Diagnosis present

## 2012-06-11 DIAGNOSIS — R29898 Other symptoms and signs involving the musculoskeletal system: Secondary | ICD-10-CM | POA: Diagnosis present

## 2012-06-11 DIAGNOSIS — I619 Nontraumatic intracerebral hemorrhage, unspecified: Secondary | ICD-10-CM

## 2012-06-11 DIAGNOSIS — I4510 Unspecified right bundle-branch block: Secondary | ICD-10-CM

## 2012-06-11 DIAGNOSIS — I129 Hypertensive chronic kidney disease with stage 1 through stage 4 chronic kidney disease, or unspecified chronic kidney disease: Secondary | ICD-10-CM | POA: Diagnosis present

## 2012-06-11 DIAGNOSIS — I10 Essential (primary) hypertension: Secondary | ICD-10-CM

## 2012-06-11 DIAGNOSIS — W19XXXA Unspecified fall, initial encounter: Secondary | ICD-10-CM | POA: Diagnosis present

## 2012-06-11 DIAGNOSIS — Z9119 Patient's noncompliance with other medical treatment and regimen: Secondary | ICD-10-CM

## 2012-06-11 DIAGNOSIS — N289 Disorder of kidney and ureter, unspecified: Secondary | ICD-10-CM

## 2012-06-11 DIAGNOSIS — I639 Cerebral infarction, unspecified: Secondary | ICD-10-CM

## 2012-06-11 DIAGNOSIS — Z91199 Patient's noncompliance with other medical treatment and regimen due to unspecified reason: Secondary | ICD-10-CM

## 2012-06-11 DIAGNOSIS — N189 Chronic kidney disease, unspecified: Secondary | ICD-10-CM | POA: Diagnosis present

## 2012-06-11 HISTORY — DX: Cerebral infarction, unspecified: I63.9

## 2012-06-11 LAB — URINALYSIS, ROUTINE W REFLEX MICROSCOPIC
Glucose, UA: 100 mg/dL — AB
Ketones, ur: NEGATIVE mg/dL
Nitrite: POSITIVE — AB
Specific Gravity, Urine: 1.015 (ref 1.005–1.030)
pH: 6 (ref 5.0–8.0)

## 2012-06-11 LAB — COMPREHENSIVE METABOLIC PANEL
AST: 30 U/L (ref 0–37)
Albumin: 3.4 g/dL — ABNORMAL LOW (ref 3.5–5.2)
BUN: 116 mg/dL — ABNORMAL HIGH (ref 6–23)
Calcium: 8.7 mg/dL (ref 8.4–10.5)
Creatinine, Ser: 5.64 mg/dL — ABNORMAL HIGH (ref 0.50–1.35)

## 2012-06-11 LAB — URINE MICROSCOPIC-ADD ON

## 2012-06-11 LAB — CBC WITH DIFFERENTIAL/PLATELET
Basophils Relative: 1 % (ref 0–1)
Eosinophils Relative: 6 % — ABNORMAL HIGH (ref 0–5)
Hemoglobin: 12.7 g/dL — ABNORMAL LOW (ref 13.0–17.0)
MCH: 28.7 pg (ref 26.0–34.0)
MCV: 85.1 fL (ref 78.0–100.0)
Monocytes Absolute: 0.5 10*3/uL (ref 0.1–1.0)
Monocytes Relative: 6 % (ref 3–12)
Neutrophils Relative %: 74 % (ref 43–77)
Platelets: 166 10*3/uL (ref 150–400)
RBC: 4.43 MIL/uL (ref 4.22–5.81)
WBC: 8.5 10*3/uL (ref 4.0–10.5)

## 2012-06-11 LAB — TROPONIN I: Troponin I: <0.3 ng/mL (ref <0.30)

## 2012-06-11 MED ORDER — DEXTROSE 5 % IV SOLN
1.0000 g | Freq: Once | INTRAVENOUS | Status: AC
  Administered 2012-06-11: 1 g via INTRAVENOUS
  Filled 2012-06-11: qty 10

## 2012-06-11 MED ORDER — ONDANSETRON HCL 4 MG/2ML IJ SOLN: 4.0000 mg | Freq: Four times a day (QID) | INTRAMUSCULAR | Status: DC | PRN

## 2012-06-11 MED ORDER — ALBUTEROL SULFATE (5 MG/ML) 0.5% IN NEBU
2.5000 mg | INHALATION_SOLUTION | RESPIRATORY_TRACT | Status: DC | PRN
  Administered 2012-06-12 – 2012-06-13 (×2): 2.5 mg via RESPIRATORY_TRACT
  Filled 2012-06-11 (×2): qty 0.5

## 2012-06-11 MED ORDER — TRAZODONE HCL 50 MG PO TABS
50.0000 mg | ORAL_TABLET | Freq: Every evening | ORAL | Status: DC | PRN
  Administered 2012-06-11: 50 mg via ORAL
  Filled 2012-06-11: qty 1

## 2012-06-11 MED ORDER — DEXTROSE 5 % IV SOLN
1.0000 g | INTRAVENOUS | Status: DC
  Administered 2012-06-12: 1 g via INTRAVENOUS
  Filled 2012-06-11 (×2): qty 10

## 2012-06-11 MED ORDER — SODIUM CHLORIDE 0.9 % IV SOLN
INTRAVENOUS | Status: DC
  Administered 2012-06-11 – 2012-06-13 (×4): via INTRAVENOUS

## 2012-06-11 MED ORDER — LABETALOL HCL 5 MG/ML IV SOLN
10.0000 mg | INTRAVENOUS | Status: DC | PRN
  Filled 2012-06-11: qty 4

## 2012-06-11 MED ORDER — ACETAMINOPHEN 325 MG PO TABS: 650.0000 mg | ORAL_TABLET | ORAL | Status: DC | PRN

## 2012-06-11 MED ORDER — ALBUTEROL SULFATE (5 MG/ML) 0.5% IN NEBU
2.5000 mg | INHALATION_SOLUTION | Freq: Once | RESPIRATORY_TRACT | Status: DC
  Filled 2012-06-11: qty 0.5

## 2012-06-11 MED ORDER — SODIUM CHLORIDE 0.9 % IV BOLUS (SEPSIS): 500.0000 mL | Freq: Once | INTRAVENOUS | Status: DC

## 2012-06-11 MED ORDER — HYDRALAZINE HCL 20 MG/ML IJ SOLN
10.0000 mg | INTRAMUSCULAR | Status: DC | PRN
  Administered 2012-06-11 – 2012-06-13 (×5): 10 mg via INTRAVENOUS
  Filled 2012-06-11 (×5): qty 1

## 2012-06-11 MED ORDER — ACETAMINOPHEN 650 MG RE SUPP: 650.0000 mg | RECTAL | Status: DC | PRN

## 2012-06-11 MED ORDER — PANTOPRAZOLE SODIUM 40 MG IV SOLR
40.0000 mg | Freq: Every day | INTRAVENOUS | Status: DC
  Administered 2012-06-11 – 2012-06-12 (×2): 40 mg via INTRAVENOUS
  Filled 2012-06-11 (×2): qty 40

## 2012-06-11 MED ORDER — SODIUM CHLORIDE 0.9 % IV BOLUS (SEPSIS): 1000.0000 mL | Freq: Once | INTRAVENOUS | Status: DC

## 2012-06-11 MED ORDER — SODIUM CHLORIDE 0.9 % IV BOLUS (SEPSIS)
1000.0000 mL | Freq: Once | INTRAVENOUS | Status: AC
  Administered 2012-06-11: 1000 mL via INTRAVENOUS

## 2012-06-11 MED ORDER — SENNOSIDES-DOCUSATE SODIUM 8.6-50 MG PO TABS
1.0000 | ORAL_TABLET | Freq: Two times a day (BID) | ORAL | Status: DC
  Administered 2012-06-13: 1 via ORAL
  Filled 2012-06-11 (×3): qty 1

## 2012-06-11 NOTE — ED Provider Notes (Signed)
History     CSN: [id number]  Arrival date & time 06/11/12  1214   First MD Initiated Contact with Patient 06/11/12 1249      Chief Complaint  Patient presents with  . Weakness    (Consider location/radiation/quality/duration/timing/severity/associated sxs/prior treatment) HPI.... level V caveat for urgent need for intervention.    Patient felt weak on Sunday and slumped to the floor laying there forapproximately 13 hours.   Patient refused medical care at that time.   He was eventually evaluated by his primary care physician Dr. Ouida Sills.  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  Additionally his creatinine was elevated.  Patient agreed to be seen in the emergency department for further evaluation  Past Medical History  Diagnosis Date  . Atrial fibrillation 02/2011    EKG from Dr. Alonza Smoker office reviewed demonstrating AF with ventricular rate of 70 bpm and occasional PVCs versus aberrancy; possible prior septal MI; low voltage, ST-T wave abnormality consistent with LVH or anterolateral ischemia  . Right bundle branch block   . Hypertension   . Stroke     Past Surgical History  Procedure Laterality Date  . Knee surgery      tendon repair  . Prostate enlargement.  self caths at times      History reviewed. No pertinent family history.  History  Substance Use Topics  . Smoking status: Never Smoker   . Smokeless tobacco: Not on file  . Alcohol Use: Yes      Review of Systems  Unable to perform ROS   Allergies  Review of patient's allergies indicates no known allergies.  Home Medications  No current outpatient prescriptions on file.  BP 184/93  Pulse 55  Temp(Src) 97.7 F (36.5 C) (Axillary)  Resp 20  SpO2 98%  Physical Exam  Nursing note and vitals reviewed. Constitutional:  Respond sluggishly to questions but reasonably appropriately  HENT:  Head: Normocephalic and atraumatic.  Eyes: Conjunctivae and EOM are normal. Pupils are equal, round,  and reactive to light.  Neck: Normal range of motion. Neck supple.  Cardiovascular: Normal rate, regular rhythm and normal heart sounds.   Pulmonary/Chest: Effort normal and breath sounds normal.  Abdominal: Soft. Bowel sounds are normal.  Musculoskeletal: Normal range of motion.  Neurological: He is alert.  Legs are weak  Skin: Skin is warm and dry.  Psychiatric: He has a normal mood and affect.    ED Course  Procedures (including critical care time)  Labs Reviewed  CBC WITH DIFFERENTIAL - Abnormal; Notable for the following:    Hemoglobin 12.7 (*)    HCT 37.7 (*)    All other components within normal limits  COMPREHENSIVE METABOLIC PANEL  URINALYSIS, ROUTINE W REFLEX MICROSCOPIC  TROPONIN I   Mr Brain Wo Contrast  06/10/2012   *RADIOLOGY REPORT*  Clinical Data: Right-sided weakness.  MRI HEAD WITHOUT CONTRAST  Technique:  Multiplanar, multiecho pulse sequences of the brain and surrounding structures were obtained according to standard protocol without intravenous contrast.  Comparison: None.  Findings: There is an intraparenchymal hemorrhage in the left lateral thalamus/ cortical spinal tract region measuring 1 cm in diameter, associated with vasogenic edema.  Diffusion imaging otherwise is technically unsuccessful.  I do not identify any other acute infarction.  There are chronic small vessel changes within the pons.  There is an old small vessel infarction in the right thalamus.  There are moderate chronic small vessel changes throughout the cerebral hemispheric white matter.  No mass lesion, hydrocephalus  or extra- axial collection.  No pituitary mass.  No fluid in the sinuses.  IMPRESSION: Acute to subacute 1 cm hemorrhage in the left lateral thalamus/cortical spinal tract region with surrounding edema.  Critical Value/emergent results were called by telephone at the time of interpretation on 06/10/2012 at 1810 hours to Dr. Ouida Sills, who verbally acknowledged these results.   Original  Report Authenticated By: Paulina Fusi, M.D.     No diagnosis found.   Date: 06/11/2012  Rate: 55  Rhythm: atrial fibrillation  QRS Axis: normal  Intervals: normal  ST/T Wave abnormalities: normal  Conduction Disutrbances:right bundle branch block  Narrative Interpretation:   Old EKG Reviewed: none available PVC     CRITICAL CARE Performed by: Donnetta Hutching Total critical care time: 30 Critical care time was exclusive of separately billable procedures and treating other patients. Critical care was necessary to treat or prevent imminent or life-threatening deterioration. Critical care was time spent personally by me on the following activities: development of treatment plan with patient and/or surrogate as well as nursing, discussions with consultants, evaluation of patient's response to treatment, examination of patient, obtaining history from patient or surrogate, ordering and performing treatments and interventions, ordering and review of laboratory studies, ordering and review of radiographic studies, pulse oximetry and re-evaluation of patient's condition. MDM  Left thalmic stroke noted. Additionally patient has elevated creatinine and a urinary tract infection. Rocephin 1 g IV initiated. IV hydration. Discussed with hospitalist Dr Irene Limbo.  He will see patient       Donnetta Hutching, MD 06/11/12 [phone]

## 2012-06-11 NOTE — ED Notes (Signed)
Pt reports to ED with weakness, sleepiness, and "dehydration" per daughter, and worsening kidney function. Daughter states, "All he needs is an IV, 2 bags of fluid, and then he'll go." Pt with stroke diagnosed last night, fall at home on Sunday for 13 hours, unsteady gait. Pt serious condition discussed with daughter and patient. No evidence of understanding. Daughter states "he's going to do what he wants." Awaiting orders.

## 2012-06-11 NOTE — H&P (Signed)
History and Physical  James Park:[id number] DOB: 1929-07-29 DOA: 06/11/2012  Referring physician: Donnetta Hutching, MD PCP: Carylon Perches, MD   Chief Complaint: Abnormal blood tests  HPI:  79 year old man presented to the emergency department on the advice of his primary care physician with history of abnormal blood work. Repeat blood work confirmed acute renal failure and revealed UTI. Additionally, patient recently suffered an ICH. Patient family refused transfer and was very reluctant to even be admitted to [facility].  History obtained from patient, daughter, EDP. Additionally EDP discussed with PCP by report. Currently unable to reach PCP. 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 He has a history of progressive right leg weakness which is chronic as well as chronic right shoulder pain from rotator cuff injury. Otherwise he does quite well but his history is notable for noncompliance, he refuses to take Lasix and has never been on any treatment for atrial fibrillation secondary to noncompliance.  5/25 patient awoke at 5 in the morning and went to the bathroom to brush his teeth. Afterwards he suddenly became rigid and was unable to walk. He never lost consciousness or passed out. There was no seizure activity. He was eventually able to lower himself to the floor where he laid for 13 hours until his daughter discovered him at approximately 6 PM. She and her husband were able to assisted to the chair. EMS was called the patient refused transport. He was noted to be generally weak, to have great difficulty with ADLs and walking. Daughter is a Engineer, civil (consulting) and arranged private aide, walker and assistance with ADLs as the patient refused to seek hospital evaluation. He is gradually improved over the last several days with increasing ability to ambulate and perform his own ADLs. His appetite has been good and he has been drinking  fluids well by report.  He saw his primary care physician in the office 5/29 at which time blood work was obtained and an MRI of the brain was ordered. This revealed acute to subacute 1 cm hemorrhage in the left lateral thalamus/cortical spinal tract region with surrounding edema. Patient refused to go to the hospital last night. This morning blood work resulted in his primary care physician contacted him by telephone and persuaded him to come to the emergency department for evaluation IV fluids. The patient's expectation at that time was to receive IV fluids and go home. EDP was able to persuade him to stay overnight. The patient remains absolutely adamant that he will go home tomorrow and would rather go home right now to be transferred to another facility.  Review of Systems:  Negative for fever, visual changes, sore throat, rash, new muscle aches, chest pain, SOB, dysuria, bleeding, n/v/abdominal pain. No dysarthria, dysphagia or new weakness. No paresthesias.  Past Medical History  Diagnosis Date  . Atrial fibrillation 02/2011    EKG from Dr. Alonza Smoker office reviewed demonstrating AF with ventricular rate of 70 bpm and occasional PVCs versus aberrancy; possible prior septal MI; low voltage, ST-T wave abnormality consistent with LVH or anterolateral ischemia  . Right bundle branch block   . Hypertension   . Stroke     Past Surgical History  Procedure Laterality Date  . Knee surgery      tendon repair  . Prostate enlargement.  self caths at times      Social History:  reports that he has never smoked. He does not  have any smokeless tobacco history on file. He reports that  drinks alcohol. He reports that he does not use illicit drugs.  Allergies  Allergen Reactions  . Altace (Ramipril)     hives    Family History  Problem Relation Age of Onset  . Parkinson's disease Brother      Prior to Admission medications   Medication Sig Start Date End Date Taking? Authorizing Provider   albuterol (PROVENTIL) (2.5 MG/3ML) 0.083% nebulizer solution Take 2.5 mg by nebulization every 6 (six) hours as needed for wheezing or shortness of breath.   Yes Historical Provider, MD  furosemide (LASIX) 20 MG tablet Take 20 mg by mouth 2 (two) times daily.   Yes Historical Provider, MD  hydrocortisone cream 0.5 % Apply 1 application topically 2 (two) times daily as needed. itching   Yes Historical Provider, MD   Physical Exam: Filed Vitals:   06/11/12 1241 06/11/12 1253 06/11/12 1303  BP: 186/89  184/93  Pulse: 88  55  Temp:  97.7 F (36.5 C)   TempSrc:  Axillary   Resp: 20    SpO2: 98%  98%    General: Examined in the emergency department. Appears calm and comfortable. Speech fluent and clear. Follows commands. Eyes: PERRL, normal lids, irises  ENT: grossly normal hearing, lips & tongue Neck: no LAD, masses or thyromegaly Cardiovascular: Bradycardic, irregular, no m/r/g. No LE edema. Respiratory: CTA bilaterally, no w/r/r. Normal respiratory effort. Abdomen: soft, ntnd Skin: Bruises and abrasions noted bilateral upper extremities, old Musculoskeletal: grossly normal tone BUE/BLE. Strength bilateral upper extremities 5/5 and symmetric. Right lower extremity 4/5. Left lower extremity 5/5. Psychiatric: grossly normal mood and affect, speech fluent and appropriate Neurologic: Although there appears to be a mild right facial droop movements of the face are symmetric and cranial nerves appear intact. No dysdiadochokinesis of the upper extremities.  Wt Readings from Last 3 Encounters:  07/03/11 93.441 kg (206 lb)    Labs on Admission:  Basic Metabolic Panel:  Recent Labs Lab 06/11/12 1329  NA 137  K 4.0  CL 100  CO2 20  GLUCOSE 118*  BUN 116*  CREATININE 5.64*  CALCIUM 8.7    Liver Function Tests:  Recent Labs Lab 06/11/12 1329  AST 30  ALT 22  ALKPHOS 111  BILITOT 0.9  PROT 7.0  ALBUMIN 3.4*    CBC:  Recent Labs Lab 06/11/12 1329  WBC 8.5  NEUTROABS  6.3  HGB 12.7*  HCT 37.7*  MCV 85.1  PLT 166    Cardiac Enzymes:  Recent Labs Lab 06/11/12 1329  TROPONINI <0.30   Radiological Exams on Admission: Mr Brain Wo Contrast  06/10/2012   *RADIOLOGY REPORT*  Clinical Data: Right-sided weakness.  MRI HEAD WITHOUT CONTRAST  Technique:  Multiplanar, multiecho pulse sequences of the brain and surrounding structures were obtained according to standard protocol without intravenous contrast.  Comparison: None.  Findings: There is an intraparenchymal hemorrhage in the left lateral thalamus/ cortical spinal tract region measuring 1 cm in diameter, associated with vasogenic edema.  Diffusion imaging otherwise is technically unsuccessful.  I do not identify any other acute infarction.  There are chronic small vessel changes within the pons.  There is an old small vessel infarction in the right thalamus.  There are moderate chronic small vessel changes throughout the cerebral hemispheric white matter.  No mass lesion, hydrocephalus or extra- axial collection.  No pituitary mass.  No fluid in the sinuses.  IMPRESSION: Acute to subacute 1 cm hemorrhage in  the left lateral thalamus/cortical spinal tract region with surrounding edema.  Critical Value/emergent results were called by telephone at the time of interpretation on 06/10/2012 at 1810 hours to Dr. Ouida Sills, who verbally acknowledged these results.   Original Report Authenticated By: Paulina Fusi, M.D.    EKG: Independently reviewed. Atrial fibrillation slow ventricular response, right bundle branch block, PVC, cannot rule out septal MI age unknown.   Principal Problem:   Acute renal failure Active Problems:   Atrial fibrillation   Right bundle branch block   Right leg weakness   Intracerebral hemorrhage   UTI (lower urinary tract infection)   Hypertension    Assessment/Plan 1. Acute renal failure superimposed on chronic kidney disease by history: baseline unknown. Check CK. Normal saline. Repeat  studies in the morning. Follow urine output. History and laboratory pattern suggest prerenal etiology, possibly secondary to subacute rhabdomyolysis. However patient has a component of chronic kidney disease. 2. Subacute intracerebral hemorrhage: Not on any anticoagulants or antiplatelet agents. Presumably secondary to hypertensive disease. No neurologic signs of elevated ICP. No respiratory depression. Hypertension is chronic. Unknown whether bradycardia is chronic. No focal deficits. CT head pending to reassess. Patient refuses transfer to [facility] and fully understands risks. No signs or symptoms of dysphagia or dysarthria. Heart healthy diet. Admit for close neurologic and blood pressure monitoring. 3. Urinary tract infection: Empiric antibiotics. Followup culture. 4. Hypertension: Monitor clinically. Untreated secondary to noncompliance. Goal systolic less than 180. 5. Atrial fibrillation with slow ventricular response: Asymptomatic, chronic. Not on any rate control agents. Patient refused warfarin in the past. Monitor clinically. 6. Known right bundle branch block 7. Chronic right leg weakness without change  Discussed above in detail with daughter and patient bedside. Patient has no memory deficits and still works as an Pensions consultant. Discussed recommended treatment would be transfer to stroke center with a dedicated neurology team that specializes in stroke care, which could not be offered here. Discussed risks of progression of intracerebral hemorrhage including catastrophic disability and death. Patient and daughter both verbalized full understanding of the risks involved. Patient adamantly that he will go home rather than be transferred. Therefore seems reasonable to admit to [facility].  Code Status: Full code Family Communication: Discussed with daughter at bedside Disposition Plan/Anticipated LOS: Admit. 2-3 days.  Time spent: 65 minutes  Brendia Sacks, MD  [facility] Pager  [phone] 06/11/2012, 5:39 PM

## 2012-06-11 NOTE — ED Notes (Signed)
Found lying in floor x 13 hour on Sunday. Confused at the time.  Seen  By Dr Ouida Sills yesterday and says he is dehydrated. Had MRI of brain as OP and showed  A stroke.

## 2012-06-12 LAB — CBC
HCT: 38.6 % — ABNORMAL LOW (ref 39.0–52.0)
Hemoglobin: 13 g/dL (ref 13.0–17.0)
MCHC: 33.7 g/dL (ref 30.0–36.0)
MCV: 84.3 fL (ref 78.0–100.0)
RDW: 15.7 % — ABNORMAL HIGH (ref 11.5–15.5)

## 2012-06-12 LAB — BASIC METABOLIC PANEL
BUN: 114 mg/dL — ABNORMAL HIGH (ref 6–23)
CO2: 17 mEq/L — ABNORMAL LOW (ref 19–32)
Chloride: 99 mEq/L (ref 96–112)
Creatinine, Ser: 5.43 mg/dL — ABNORMAL HIGH (ref 0.50–1.35)
Glucose, Bld: 121 mg/dL — ABNORMAL HIGH (ref 70–99)

## 2012-06-12 MED ORDER — LORAZEPAM 0.5 MG PO TABS
1.0000 mg | ORAL_TABLET | Freq: Every evening | ORAL | Status: DC | PRN
  Administered 2012-06-12: 1 mg via ORAL
  Filled 2012-06-12: qty 2

## 2012-06-12 MED ORDER — LOSARTAN POTASSIUM 50 MG PO TABS
50.0000 mg | ORAL_TABLET | Freq: Every day | ORAL | Status: DC
  Administered 2012-06-12 – 2012-06-13 (×2): 50 mg via ORAL
  Filled 2012-06-12 (×2): qty 1

## 2012-06-12 MED ORDER — ALBUTEROL SULFATE (5 MG/ML) 0.5% IN NEBU
2.5000 mg | INHALATION_SOLUTION | Freq: Two times a day (BID) | RESPIRATORY_TRACT | Status: DC
  Administered 2012-06-12 – 2012-06-13 (×2): 2.5 mg via RESPIRATORY_TRACT
  Filled 2012-06-12 (×2): qty 0.5

## 2012-06-12 NOTE — Progress Notes (Signed)
Pt ambulated from bed to toilet in room. While using the toilet, pt became unsteady and had to use the counter to maintain his balance. On the way back to bed pt had increased work of breathing, and almost collapsed before getting in the bed. O2 sats remained in the 90s, and patient's WOB improved almost immediately after returning to bed.  Will use BSC.

## 2012-06-12 NOTE — Progress Notes (Signed)
Pt refused BP cuff operating hourly. Spot checks performed before and after BP medicine administration.

## 2012-06-13 MED ORDER — PANTOPRAZOLE SODIUM 40 MG PO TBEC: 40.0000 mg | DELAYED_RELEASE_TABLET | Freq: Every day | ORAL | Status: DC

## 2012-06-13 MED ORDER — CEFUROXIME AXETIL 250 MG PO TABS: 500.0000 mg | ORAL_TABLET | Freq: Two times a day (BID) | ORAL | Status: DC

## 2012-06-13 MED ORDER — LOSARTAN POTASSIUM 50 MG PO TABS: ORAL_TABLET | ORAL | Status: DC

## 2012-06-13 NOTE — Progress Notes (Signed)
NAMEGLENWOOD, James Park              ACCOUNT NO.:  [id number]  MEDICAL RECORD NO.:  [id number]  LOCATION:  IC02                          FACILITY:  [facility]  PHYSICIAN:  Mila Homer. Sudie Bailey, M.D.DATE OF BIRTH:  08/29/1929  DATE OF PROCEDURE: DATE OF DISCHARGE:                                PROGRESS NOTE   SUBJECTIVE:  This 79 year old was admitted to the hospital last night.  Apparently he had a stroke about 6 days ago.  He did not come to medical attention until later this week when he saw his local medical doctor, Dr. Ouida Sills.  MRI of the brain 2 days ago showed an acute, subacute 1 cm hemorrhage in the left lateral thalamus/cortical spinal tract with surrounding edema.  When the results were back Dr. Ouida Sills recommended that he be admitted to the hospital, particularly since his creatinine was higher than it had been by history.  His daughter is there in the room with him today.  She is a Engineer, building services.  She tells me he has been dozing on and off even before the stroke.  He was able to get around with a walker once the acute episode had subsided 6 days ago.  He was eating and drinking well.  OBJECTIVE:  VITAL SIGNS:  Temperature 97.7, pulse 37, respiratory rate 15, blood pressure 169/72.  His pulse is varied between 37 and 93. GENERAL:  He is sitting up in the chair.  His speech was slightly slurred.  He dozes on and off and falls asleep a number of times but wakes easily. HEART:  Irregular irregularity.  Rate of about 50 on my exam. LUNGS:  Show decreased breath sounds but are clear. EXTREMITIES:  He does have some swelling in the right hand and arm where he has had a skin tear on the dorsum of the right hand and another tear near the right elbow.  His admission BUN was 116 with a creatinine 5.64 but it is now 114, creatinine 5.43.  His troponin was less than 30.  His CK 380.  White cell count 9700, hemoglobin 13.  His UA showed positive nitrite, moderate leukocyte.  A urine  culture is pending. CT scan of the head showed similar findings.  The MRI done prior to this really with no change.  A 12-lead EKG shows what appears to be atrial fib/flutter.  Flutter waves are noted in a number of leads but particularly in leads V1, appears to be a right bundle branch block.  DIAGNOSES: 1. Intracerebral hemorrhage. 2. Hypertension. 3. Chronic atrial fibrillation. 4. Swelling of the right arm with several superficial lacerations.  PLAN:  He will continue on ceftriaxone IV.  Urine culture is pending. He has been on hydralazine at this point for blood pressure control but will need to have something to take p.o., particularly if he is planning on going home tomorrow which he shows interest in doing.  Hopefully given the 1-cm size of the bleed with surrounding edema he will almost completely recover from this with time.  I emphasized the importance of blood pressure control.     Mila Homer. Sudie Bailey, M.D.     SDK/MEDQ  D:  06/12/2012  T:  06/13/2012  Job:  [id number]

## 2012-06-13 NOTE — Progress Notes (Signed)
Pt to be discharged home per MD order. All discharge instructions and medications gone over with patient and pts daughter using the teach back method. All discharge questions and concerns answered. Stressed importance of making follow up appointment with Dr Ouida Sills on Monday. Pt discharged home via wheelchair with personal belongings.

## 2012-06-13 NOTE — Discharge Summary (Signed)
NAMECLOYD, James Park              ACCOUNT NO.:  [id number]  MEDICAL RECORD NO.:  [id number]  LOCATION:  IC02                          FACILITY:  [facility]  PHYSICIAN:  Mila Homer. Sudie Bailey, M.D.DATE OF BIRTH:  1930/01/11  DATE OF ADMISSION:  06/11/2012 DATE OF DISCHARGE:  06/01/2014LH                              DISCHARGE SUMMARY   This 79 year old was admitted to the hospital with a stroke.  He had a benign 3 day hospitalization extending from May 30 to June 13, 2012.  His vital signs remained stable but his heart rate was low.  His admission white cell count was 8500, recheck 9700.  His hemoglobin was 12.7, and troponin less than 3 0.  His CK was 380.  He had a BUN of 116, creatinine of 5.64, recheck of 114 and 5.43.  His UA was positive for nitrite, moderate leukocytes with 21-50 WBCs per HPF and many bacteria.  His urine culture results were pending.  MRSA by PCR was negative.  CT scan of the head without contrast showed stable intraparenchymal hemorrhage in the left thalamus measuring 1.2 cm.  There was stable cerebral atrophy.  This was compared to an MRI of the brain done 2 days prior to this.  A 12 lead EKG showed what appeared to be right bundle branch block and flutter waves.  He was admitted to the hospital.  He was put on ceftriaxone a g IV q.24 hours, furosemide 20 mg daily and a nebulizer up to q.i.d.  He received both hydralazine and labetalol IV the first 2 days then was switched to losartan 50 mg daily.  He still required 1 other dose of hydralazine in the middle of the night the day after starting hydralazine but his pressure was definitely improved, in fact dropped down in the 140 range.  He was eating normally, swallowing normally and seemed to be much improved.  ASSESSMENT: 1. Left brain intracerebral hemorrhage. 2. Hypertension. 3. Chronic atrial fibrillation. 4. Swelling in the right arm with several superficial lacerations,     felt to be secondary to  the fall when this all happened. 5. Chronic atrial fibrillation. 6. Dehydration.  He is discharged home on cefuroxime 500 mg b.i.d. for a 10 day course. He will also be on albuterol, furosemide 20 mg b.i.d.  His daughter is going to purchase a blood pressure cuff for the 24/7 staff to use and she herself, as a nurse, will check his blood pressure and make sure that the digital blood pressure cuff is accurate.  He will be taking losartan at home 50 mg daily unless his systolic blood pressure is greater than 150-160 in which case we will use 2 of these a day.  He will have followup with Dr. Ouida Sills, his local medical doctor, either this week or the next.     Mila Homer. Sudie Bailey, M.D.     SDK/MEDQ  D:  06/13/2012  T:  06/13/2012  Job:  [id number]

## 2012-06-13 NOTE — Progress Notes (Signed)
The patient is receiving Protonix by the intravenous route.  Based on criteria approved by the Pharmacy and Therapeutics Committee and the Medical Executive Committee, the medication is being converted to the equivalent oral dose form.  These criteria include: -No Active GI bleeding -Able to tolerate diet of full liquids (or better) or tube feeding OR able to tolerate other medications by the oral or enteral route  If you have any questions about this conversion, please contact the Pharmacy Department (ext [phone]).  Thank you.  Elson Clan, [facility] 06/13/2012 10:06 AM

## 2012-06-13 NOTE — Progress Notes (Signed)
Pt up and ambulating in hallway with walker with nurse and pts daughter. Pt ambulated about 50 feet. Tolerated well, no problems noted. Pt denied any feelings of feeling short of breath or dizzy.

## 2012-06-14 LAB — URINE CULTURE

## 2012-06-15 NOTE — Progress Notes (Signed)
UR chart review completed.  

## 2012-06-17 ENCOUNTER — Other Ambulatory Visit (HOSPITAL_COMMUNITY): Payer: Self-pay

## 2012-06-17 DIAGNOSIS — G4730 Sleep apnea, unspecified: Secondary | ICD-10-CM

## 2012-06-23 ENCOUNTER — Other Ambulatory Visit (HOSPITAL_COMMUNITY): Payer: Self-pay | Admitting: Nephrology

## 2012-06-23 DIAGNOSIS — N289 Disorder of kidney and ureter, unspecified: Secondary | ICD-10-CM

## 2012-06-24 ENCOUNTER — Ambulatory Visit (HOSPITAL_COMMUNITY)
Admission: RE | Admit: 2012-06-24 | Discharge: 2012-06-24 | Disposition: A | Discharge status: Home / Self Care | Payer: Medicare Other | Source: Ambulatory Visit | Attending: Nephrology | Admitting: Nephrology

## 2012-06-24 DIAGNOSIS — N289 Disorder of kidney and ureter, unspecified: Secondary | ICD-10-CM

## 2012-06-28 ENCOUNTER — Other Ambulatory Visit: Payer: Self-pay | Admitting: *Deleted

## 2012-06-28 ENCOUNTER — Encounter (HOSPITAL_COMMUNITY): Payer: Self-pay | Admitting: Pharmacy Technician

## 2012-06-29 ENCOUNTER — Encounter (HOSPITAL_COMMUNITY): Payer: Self-pay | Admitting: Vascular Surgery

## 2012-06-29 MED ORDER — DEXTROSE 5 % IV SOLN: 1.5000 g | INTRAVENOUS | Status: DC

## 2012-06-29 MED ORDER — SODIUM CHLORIDE 0.9 % IV SOLN
INTRAVENOUS | Status: DC
  Administered 2012-06-30: 12:00:00 via INTRAVENOUS

## 2012-06-29 MED ORDER — DEXTROSE 5 % IV SOLN
1.5000 g | INTRAVENOUS | Status: AC
  Administered 2012-06-30: 1.5 g via INTRAVENOUS
  Filled 2012-06-29: qty 1.5

## 2012-06-29 NOTE — Progress Notes (Signed)
James Park, pts daughter said that she had instructions from Dr Elizabeth Sauer office giving patient permission to eat protein and nothing to eat 4 hours prior to coming to the hospital.  I informed Herbert Seta and Mr Elmore that pt is to be NPO after midnight, per [facility] policy.

## 2012-06-29 NOTE — Progress Notes (Signed)
06/29/12 1734  OBSTRUCTIVE SLEEP APNEA  Have you ever been diagnosed with sleep apnea through a sleep study? No  Do you snore loudly (loud enough to be heard through closed doors)?  1  Do you often feel tired, fatigued, or sleepy during the daytime? 1  Has anyone observed you stop breathing during your sleep? 0  Do you have, or are you being treated for high blood pressure? 1  BMI more than 35 kg/m2? 0  Age over 77 years old? 1  Neck circumference greater than 40 cm/18 inches? (18.5)  Gender: 1  Obstructive Sleep Apnea Score 5  Score 4 or greater  Results sent to PCP

## 2012-06-30 ENCOUNTER — Ambulatory Visit (HOSPITAL_COMMUNITY): Payer: Medicare Other

## 2012-06-30 ENCOUNTER — Ambulatory Visit (HOSPITAL_COMMUNITY): Payer: Medicare Other | Admitting: Anesthesiology

## 2012-06-30 ENCOUNTER — Encounter (HOSPITAL_COMMUNITY): Payer: Self-pay | Admitting: Anesthesiology

## 2012-06-30 ENCOUNTER — Ambulatory Visit (HOSPITAL_COMMUNITY)
Admission: AD | Admit: 2012-06-30 | Discharge: 2012-06-30 | Disposition: A | Discharge status: Home / Self Care | Payer: Medicare Other | Source: Ambulatory Visit | Attending: Vascular Surgery | Admitting: Vascular Surgery

## 2012-06-30 ENCOUNTER — Encounter (HOSPITAL_COMMUNITY): Payer: Self-pay | Admitting: *Deleted

## 2012-06-30 ENCOUNTER — Encounter (HOSPITAL_COMMUNITY)
Admission: AD | Disposition: A | Discharge status: Home / Self Care | Payer: Self-pay | Source: Ambulatory Visit | Attending: Vascular Surgery

## 2012-06-30 DIAGNOSIS — R5381 Other malaise: Secondary | ICD-10-CM | POA: Insufficient documentation

## 2012-06-30 DIAGNOSIS — I4949 Other premature depolarization: Secondary | ICD-10-CM | POA: Insufficient documentation

## 2012-06-30 DIAGNOSIS — I4510 Unspecified right bundle-branch block: Secondary | ICD-10-CM | POA: Insufficient documentation

## 2012-06-30 DIAGNOSIS — N186 End stage renal disease: Secondary | ICD-10-CM

## 2012-06-30 DIAGNOSIS — Z9119 Patient's noncompliance with other medical treatment and regimen: Secondary | ICD-10-CM | POA: Insufficient documentation

## 2012-06-30 DIAGNOSIS — N400 Enlarged prostate without lower urinary tract symptoms: Secondary | ICD-10-CM | POA: Insufficient documentation

## 2012-06-30 DIAGNOSIS — I4891 Unspecified atrial fibrillation: Secondary | ICD-10-CM | POA: Insufficient documentation

## 2012-06-30 DIAGNOSIS — Z91199 Patient's noncompliance with other medical treatment and regimen due to unspecified reason: Secondary | ICD-10-CM | POA: Insufficient documentation

## 2012-06-30 DIAGNOSIS — Z79899 Other long term (current) drug therapy: Secondary | ICD-10-CM | POA: Insufficient documentation

## 2012-06-30 DIAGNOSIS — N179 Acute kidney failure, unspecified: Secondary | ICD-10-CM

## 2012-06-30 DIAGNOSIS — I120 Hypertensive chronic kidney disease with stage 5 chronic kidney disease or end stage renal disease: Secondary | ICD-10-CM | POA: Insufficient documentation

## 2012-06-30 DIAGNOSIS — I619 Nontraumatic intracerebral hemorrhage, unspecified: Secondary | ICD-10-CM | POA: Insufficient documentation

## 2012-06-30 DIAGNOSIS — N390 Urinary tract infection, site not specified: Secondary | ICD-10-CM | POA: Insufficient documentation

## 2012-06-30 HISTORY — DX: Unspecified osteoarthritis, unspecified site: M19.90

## 2012-06-30 HISTORY — DX: Major depressive disorder, single episode, unspecified: F32.9

## 2012-06-30 HISTORY — DX: Depression, unspecified: F32.A

## 2012-06-30 HISTORY — DX: Constipation, unspecified: K59.00

## 2012-06-30 HISTORY — DX: Anxiety disorder, unspecified: F41.9

## 2012-06-30 HISTORY — DX: Gastro-esophageal reflux disease without esophagitis: K21.9

## 2012-06-30 HISTORY — DX: Unspecified rotator cuff tear or rupture of unspecified shoulder, not specified as traumatic: M75.100

## 2012-06-30 HISTORY — DX: Retention of urine, unspecified: R33.9

## 2012-06-30 HISTORY — PX: INSERTION OF DIALYSIS CATHETER: SHX1324

## 2012-06-30 LAB — POCT I-STAT 4, (NA,K, GLUC, HGB,HCT)
Hemoglobin: 13.6 g/dL (ref 13.0–17.0)
Sodium: 136 mEq/L (ref 135–145)

## 2012-06-30 SURGERY — INSERTION OF DIALYSIS CATHETER
Anesthesia: Monitor Anesthesia Care | Site: Neck | Laterality: Right | Wound class: Clean

## 2012-06-30 MED ORDER — GLYCOPYRROLATE 0.2 MG/ML IJ SOLN
INTRAMUSCULAR | Status: DC | PRN
  Administered 2012-06-30 (×2): 0.2 mg via INTRAVENOUS

## 2012-06-30 MED ORDER — HEPARIN SODIUM (PORCINE) 1000 UNIT/ML IJ SOLN
INTRAMUSCULAR | Status: AC
  Filled 2012-06-30: qty 1

## 2012-06-30 MED ORDER — FENTANYL CITRATE 0.05 MG/ML IJ SOLN
INTRAMUSCULAR | Status: DC | PRN
  Administered 2012-06-30: 50 ug via INTRAVENOUS

## 2012-06-30 MED ORDER — MEPERIDINE HCL 25 MG/ML IJ SOLN: 6.2500 mg | INTRAMUSCULAR | Status: DC | PRN

## 2012-06-30 MED ORDER — SODIUM CHLORIDE 0.9 % IR SOLN
Status: DC | PRN
  Administered 2012-06-30: 14:00:00

## 2012-06-30 MED ORDER — PROMETHAZINE HCL 25 MG/ML IJ SOLN: 6.2500 mg | INTRAMUSCULAR | Status: DC | PRN

## 2012-06-30 MED ORDER — LIDOCAINE-EPINEPHRINE 0.5 %-1:200000 IJ SOLN
INTRAMUSCULAR | Status: AC
  Filled 2012-06-30: qty 1

## 2012-06-30 MED ORDER — LIDOCAINE-EPINEPHRINE 0.5 %-1:200000 IJ SOLN
INTRAMUSCULAR | Status: DC | PRN
  Administered 2012-06-30: 7 mL

## 2012-06-30 MED ORDER — MUPIROCIN 2 % EX OINT: TOPICAL_OINTMENT | Freq: Two times a day (BID) | CUTANEOUS | Status: DC

## 2012-06-30 MED ORDER — MIDAZOLAM HCL 2 MG/2ML IJ SOLN: 0.5000 mg | Freq: Once | INTRAMUSCULAR | Status: DC | PRN

## 2012-06-30 MED ORDER — HYDROCODONE-ACETAMINOPHEN 5-325MG PREPACK (~~LOC~~: ORAL_TABLET | ORAL | Status: DC

## 2012-06-30 MED ORDER — SODIUM CHLORIDE 0.9 % IR SOLN
Status: DC | PRN
  Administered 2012-06-30: 1000 mL

## 2012-06-30 MED ORDER — SODIUM CHLORIDE 0.9 % IV SOLN
INTRAVENOUS | Status: DC | PRN
  Administered 2012-06-30: 13:00:00 via INTRAVENOUS

## 2012-06-30 MED ORDER — MIDAZOLAM HCL 5 MG/5ML IJ SOLN
INTRAMUSCULAR | Status: DC | PRN
  Administered 2012-06-30: 1 mg via INTRAVENOUS

## 2012-06-30 MED ORDER — FENTANYL CITRATE 0.05 MG/ML IJ SOLN: 25.0000 ug | INTRAMUSCULAR | Status: DC | PRN

## 2012-06-30 MED ORDER — OXYCODONE HCL 5 MG PO TABS: 5.0000 mg | ORAL_TABLET | Freq: Once | ORAL | Status: DC | PRN

## 2012-06-30 MED ORDER — HEPARIN SODIUM (PORCINE) 1000 UNIT/ML IJ SOLN
INTRAMUSCULAR | Status: DC | PRN
  Administered 2012-06-30: 1000 [IU]

## 2012-06-30 MED ORDER — OXYCODONE HCL 5 MG/5ML PO SOLN: 5.0000 mg | Freq: Once | ORAL | Status: DC | PRN

## 2012-06-30 SURGICAL SUPPLY — 40 items
BAG DECANTER FOR FLEXI CONT (MISCELLANEOUS) ×2 IMPLANT
CATH CANNON HEMO 15F 50CM (CATHETERS) IMPLANT
CATH CANNON HEMO 15FR 19 (HEMODIALYSIS SUPPLIES) IMPLANT
CATH CANNON HEMO 15FR 23CM (HEMODIALYSIS SUPPLIES) IMPLANT
CATH CANNON HEMO 15FR 31CM (HEMODIALYSIS SUPPLIES) IMPLANT
CATH CANNON HEMO 15FR 32CM (HEMODIALYSIS SUPPLIES) ×2 IMPLANT
CLIP LIGATING EXTRA MED SLVR (CLIP) ×2 IMPLANT
CLIP LIGATING EXTRA SM BLUE (MISCELLANEOUS) ×2 IMPLANT
CLOTH BEACON ORANGE TIMEOUT ST (SAFETY) ×2 IMPLANT
CLSR STERI-STRIP ANTIMIC 1/2X4 (GAUZE/BANDAGES/DRESSINGS) ×2 IMPLANT
COVER PROBE W GEL 5X96 (DRAPES) IMPLANT
COVER SURGICAL LIGHT HANDLE (MISCELLANEOUS) ×2 IMPLANT
DECANTER SPIKE VIAL GLASS SM (MISCELLANEOUS) ×2 IMPLANT
DRAPE C-ARM 42X72 X-RAY (DRAPES) ×2 IMPLANT
DRAPE CHEST BREAST 15X10 FENES (DRAPES) ×2 IMPLANT
GAUZE SPONGE 2X2 8PLY STRL LF (GAUZE/BANDAGES/DRESSINGS) ×1 IMPLANT
GAUZE SPONGE 4X4 16PLY XRAY LF (GAUZE/BANDAGES/DRESSINGS) ×2 IMPLANT
GLOVE SS BIOGEL STRL SZ 7.5 (GLOVE) ×1 IMPLANT
GLOVE SUPERSENSE BIOGEL SZ 7.5 (GLOVE) ×1
GOWN STRL NON-REIN LRG LVL3 (GOWN DISPOSABLE) ×4 IMPLANT
KIT BASIN OR (CUSTOM PROCEDURE TRAY) ×2 IMPLANT
KIT ROOM TURNOVER OR (KITS) ×2 IMPLANT
NEEDLE 18GX1X1/2 (RX/OR ONLY) (NEEDLE) ×2 IMPLANT
NEEDLE 22X1 1/2 (OR ONLY) (NEEDLE) ×2 IMPLANT
NEEDLE HYPO 25GX1X1/2 BEV (NEEDLE) ×2 IMPLANT
NS IRRIG 1000ML POUR BTL (IV SOLUTION) ×2 IMPLANT
PACK SURGICAL SETUP 50X90 (CUSTOM PROCEDURE TRAY) ×2 IMPLANT
PAD ARMBOARD 7.5X6 YLW CONV (MISCELLANEOUS) ×4 IMPLANT
SOAP 2 % CHG 4 OZ (WOUND CARE) ×2 IMPLANT
SPONGE GAUZE 2X2 STER 10/PKG (GAUZE/BANDAGES/DRESSINGS) ×1
SUT ETHILON 3 0 PS 1 (SUTURE) ×2 IMPLANT
SUT VICRYL 4-0 PS2 18IN ABS (SUTURE) ×2 IMPLANT
SYR 20CC LL (SYRINGE) ×2 IMPLANT
SYR 30ML LL (SYRINGE) IMPLANT
SYR 5ML LL (SYRINGE) ×4 IMPLANT
SYR CONTROL 10ML LL (SYRINGE) ×2 IMPLANT
SYRINGE 10CC LL (SYRINGE) ×2 IMPLANT
TOWEL OR 17X24 6PK STRL BLUE (TOWEL DISPOSABLE) ×2 IMPLANT
TOWEL OR 17X26 10 PK STRL BLUE (TOWEL DISPOSABLE) ×2 IMPLANT
WATER STERILE IRR 1000ML POUR (IV SOLUTION) ×2 IMPLANT

## 2012-06-30 NOTE — H&P (View-Only) (Signed)
History and Physical  James Park MRN:[id number] DOB: 02/28/1929 DOA: 06/11/2012  Referring physician: Brian Cook, MD PCP: FAGAN,ROY, MD   Chief Complaint: Abnormal blood tests  HPI:  79-year-old man presented to the emergency department on the advice of his primary care physician with history of abnormal blood work. Repeat blood work confirmed acute renal failure and revealed UTI. Additionally, patient recently suffered an ICH. Patient family refused transfer and was very reluctant to even be admitted to [facility].  History obtained from patient, daughter, EDP. Additionally EDP discussed with PCP by report. Currently unable to reach PCP. Patient is very high functioning, continues to work as a lawyer, has no memory problems and was just using his tractor last week. He has a history of progressive right leg weakness which is chronic as well as chronic right shoulder pain from rotator cuff injury. Otherwise he does quite well but his history is notable for noncompliance, he refuses to take Lasix and has never been on any treatment for atrial fibrillation secondary to noncompliance.  5/25 patient awoke at 5 in the morning and went to the bathroom to brush his teeth. Afterwards he suddenly became rigid and was unable to walk. He never lost consciousness or passed out. There was no seizure activity. He was eventually able to lower himself to the floor where he laid for 13 hours until his daughter discovered him at approximately 6 PM. She and her husband were able to assisted to the chair. EMS was called the patient refused transport. He was noted to be generally weak, to have great difficulty with ADLs and walking. Daughter is a nurse and arranged private aide, walker and assistance with ADLs as the patient refused to seek hospital evaluation. He is gradually improved over the last several days with increasing ability to ambulate and perform his own ADLs. His appetite has been good and he has been drinking  fluids well by report.  He saw his primary care physician in the office 5/29 at which time blood work was obtained and an MRI of the brain was ordered. This revealed acute to subacute 1 cm hemorrhage in the left lateral thalamus/cortical spinal tract region with surrounding edema. Patient refused to go to the hospital last night. This morning blood work resulted in his primary care physician contacted him by telephone and persuaded him to come to the emergency department for evaluation IV fluids. The patient's expectation at that time was to receive IV fluids and go home. EDP was able to persuade him to stay overnight. The patient remains absolutely adamant that he will go home tomorrow and would rather go home right now to be transferred to another facility.  Review of Systems:  Negative for fever, visual changes, sore throat, rash, new muscle aches, chest pain, SOB, dysuria, bleeding, n/v/abdominal pain. No dysarthria, dysphagia or new weakness. No paresthesias.  Past Medical History  Diagnosis Date  . Atrial fibrillation 02/2011    EKG from Dr. Fagan's office reviewed demonstrating AF with ventricular rate of 70 bpm and occasional PVCs versus aberrancy; possible prior septal MI; low voltage, ST-T wave abnormality consistent with LVH or anterolateral ischemia  . Right bundle branch block   . Hypertension   . Stroke     Past Surgical History  Procedure Laterality Date  . Knee surgery      tendon repair  . Prostate enlargement.  self caths at times      Social History:  reports that he has never smoked. He does not   have any smokeless tobacco history on file. He reports that  drinks alcohol. He reports that he does not use illicit drugs.  Allergies  Allergen Reactions  . Altace (Ramipril)     hives    Family History  Problem Relation Age of Onset  . Parkinson's disease Brother      Prior to Admission medications   Medication Sig Start Date End Date Taking? Authorizing Provider   albuterol (PROVENTIL) (2.5 MG/3ML) 0.083% nebulizer solution Take 2.5 mg by nebulization every 6 (six) hours as needed for wheezing or shortness of breath.   Yes Historical Provider, MD  furosemide (LASIX) 20 MG tablet Take 20 mg by mouth 2 (two) times daily.   Yes Historical Provider, MD  hydrocortisone cream 0.5 % Apply 1 application topically 2 (two) times daily as needed. itching   Yes Historical Provider, MD   Physical Exam: Filed Vitals:   06/11/12 1241 06/11/12 1253 06/11/12 1303  BP: 186/89  184/93  Pulse: 88  55  Temp:  97.7 F (36.5 C)   TempSrc:  Axillary   Resp: 20    SpO2: 98%  98%    General: Examined in the emergency department. Appears calm and comfortable. Speech fluent and clear. Follows commands. Eyes: PERRL, normal lids, irises  ENT: grossly normal hearing, lips & tongue Neck: no LAD, masses or thyromegaly Cardiovascular: Bradycardic, irregular, no m/r/g. No LE edema. Respiratory: CTA bilaterally, no w/r/r. Normal respiratory effort. Abdomen: soft, ntnd Skin: Bruises and abrasions noted bilateral upper extremities, old Musculoskeletal: grossly normal tone BUE/BLE. Strength bilateral upper extremities 5/5 and symmetric. Right lower extremity 4/5. Left lower extremity 5/5. Psychiatric: grossly normal mood and affect, speech fluent and appropriate Neurologic: Although there appears to be a mild right facial droop movements of the face are symmetric and cranial nerves appear intact. No dysdiadochokinesis of the upper extremities.  Wt Readings from Last 3 Encounters:  07/03/11 93.441 kg (206 lb)    Labs on Admission:  Basic Metabolic Panel:  Recent Labs Lab 06/11/12 1329  NA 137  K 4.0  CL 100  CO2 20  GLUCOSE 118*  BUN 116*  CREATININE 5.64*  CALCIUM 8.7    Liver Function Tests:  Recent Labs Lab 06/11/12 1329  AST 30  ALT 22  ALKPHOS 111  BILITOT 0.9  PROT 7.0  ALBUMIN 3.4*    CBC:  Recent Labs Lab 06/11/12 1329  WBC 8.5  NEUTROABS  6.3  HGB 12.7*  HCT 37.7*  MCV 85.1  PLT 166    Cardiac Enzymes:  Recent Labs Lab 06/11/12 1329  TROPONINI <0.30   Radiological Exams on Admission: Mr Brain Wo Contrast  06/10/2012   *RADIOLOGY REPORT*  Clinical Data: Right-sided weakness.  MRI HEAD WITHOUT CONTRAST  Technique:  Multiplanar, multiecho pulse sequences of the brain and surrounding structures were obtained according to standard protocol without intravenous contrast.  Comparison: None.  Findings: There is an intraparenchymal hemorrhage in the left lateral thalamus/ cortical spinal tract region measuring 1 cm in diameter, associated with vasogenic edema.  Diffusion imaging otherwise is technically unsuccessful.  I do not identify any other acute infarction.  There are chronic small vessel changes within the pons.  There is an old small vessel infarction in the right thalamus.  There are moderate chronic small vessel changes throughout the cerebral hemispheric white matter.  No mass lesion, hydrocephalus or extra- axial collection.  No pituitary mass.  No fluid in the sinuses.  IMPRESSION: Acute to subacute 1 cm hemorrhage in   the left lateral thalamus/cortical spinal tract region with surrounding edema.  Critical Value/emergent results were called by telephone at the time of interpretation on 06/10/2012 at 1810 hours to Dr. Fagan, who verbally acknowledged these results.   Original Report Authenticated By: Mark Shogry, M.D.    EKG: Independently reviewed. Atrial fibrillation slow ventricular response, right bundle branch block, PVC, cannot rule out septal MI age unknown.   Principal Problem:   Acute renal failure Active Problems:   Atrial fibrillation   Right bundle branch block   Right leg weakness   Intracerebral hemorrhage   UTI (lower urinary tract infection)   Hypertension    Assessment/Plan 1. Acute renal failure superimposed on chronic kidney disease by history: baseline unknown. Check CK. Normal saline. Repeat  studies in the morning. Follow urine output. History and laboratory pattern suggest prerenal etiology, possibly secondary to subacute rhabdomyolysis. However patient has a component of chronic kidney disease. 2. Subacute intracerebral hemorrhage: Not on any anticoagulants or antiplatelet agents. Presumably secondary to hypertensive disease. No neurologic signs of elevated ICP. No respiratory depression. Hypertension is chronic. Unknown whether bradycardia is chronic. No focal deficits. CT head pending to reassess. Patient refuses transfer to [place] and fully understands risks. No signs or symptoms of dysphagia or dysarthria. Heart healthy diet. Admit for close neurologic and blood pressure monitoring. 3. Urinary tract infection: Empiric antibiotics. Followup culture. 4. Hypertension: Monitor clinically. Untreated secondary to noncompliance. Goal systolic less than 180. 5. Atrial fibrillation with slow ventricular response: Asymptomatic, chronic. Not on any rate control agents. Patient refused warfarin in the past. Monitor clinically. 6. Known right bundle branch block 7. Chronic right leg weakness without change  Discussed above in detail with daughter and patient bedside. Patient has no memory deficits and still works as an attorney. Discussed recommended treatment would be transfer to stroke center with a dedicated neurology team that specializes in stroke care, which could not be offered here. Discussed risks of progression of intracerebral hemorrhage including catastrophic disability and death. Patient and daughter both verbalized full understanding of the risks involved. Patient adamantly that he will go home rather than be transferred. Therefore seems reasonable to admit to [facility].  Code Status: Full code Family Communication: Discussed with daughter at bedside Disposition Plan/Anticipated LOS: Admit. 2-3 days.  Time spent: 65 minutes  Jahlen Bollman, MD  [facility] Pager  [phone] 06/11/2012, 5:39 PM      

## 2012-06-30 NOTE — Op Note (Signed)
OPERATIVE REPORT  DATE OF SURGERY: 06/30/2012  PATIENT: James Park, 79 y.o. male MRN: [id number]  DOB: 01-Feb-1929  PRE-OPERATIVE DIAGNOSIS: ESRD  POST-OPERATIVE DIAGNOSIS:  Same  PROCEDURE: R IJ HD CATH  SURGEON:  Gretta Began, M.D.    ANESTHESIA:  MAC  EBL: minimal ml  Total I/O In: 200 [I.V.:200] Out: 10 [Blood:10]  BLOOD ADMINISTERED: none  DRAINS: none  SPECIMEN: none  COUNTS CORRECT:  YES  PLAN OF CARE: PACU   PATIENT DISPOSITION:  PACU - hemodynamically stable  PROCEDURE DETAILS: Was taken to the operating room and placed in supine position where the area of the right neck left neck were imaged with ultrasound revealing patent jugular veins bilaterally. The right and left neck and chest were prepped and draped in usual sterile fashion. The patient was placed in Trendelenburg position. Using ultrasound direction the right internal jugular vein was accessed with an 18-gauge needle. Seldinger technique was used to pass a guidewire down to the level of the right atrium and this was confirmed with fluoroscopy. A dilator and peel-away sheath was passed over the guidewire and the dilator and guidewire removed. The 27 cm dialysis catheter was passed to the peel-away sheath and the peel-away sheath was removed. The catheter tips positioned level distal right atrium. The catheter was brought through subcutaneous tunnel through a separate stab incision. The 2 ports were attached. Both lumens flushed and aspirated easily and were locked with 1000 unit per cc heparin. The catheter was secured to the skin with a 3-0 nylon stitch and the entry site was closed with a 4-0 subcuticular Vicryl stitch. Sterile dressing was applied and the patient was taken to the recovery room where chest x-ray is pending   Gretta Began, M.D. 06/30/2012 1:45 PM

## 2012-06-30 NOTE — Anesthesia Postprocedure Evaluation (Signed)
  Anesthesia Post-op Note  Patient: James Park  Procedure(s) Performed: Procedure(s): INSERTION OF DIALYSIS CATHETER right IJ (Right)  Patient Location: PACU  Anesthesia Type:MAC  Level of Consciousness: awake, alert , oriented and patient cooperative  Airway and Oxygen Therapy: Patient Spontanous Breathing  Post-op Pain: none  Post-op Assessment: Post-op Vital signs reviewed, Patient's Cardiovascular Status Stable, Respiratory Function Stable, Patent Airway, No signs of Nausea or vomiting and Pain level controlled  Post-op Vital Signs: Reviewed and stable  Complications: No apparent anesthesia complications

## 2012-06-30 NOTE — Preoperative (Signed)
Beta Blockers   Reason not to administer Beta Blockers:Not Applicable 

## 2012-06-30 NOTE — Transfer of Care (Signed)
Immediate Anesthesia Transfer of Care Note  Patient: James Park  Procedure(s) Performed: Procedure(s): INSERTION OF DIALYSIS CATHETER right IJ (Right)  Patient Location: PACU  Anesthesia Type:MAC  Level of Consciousness: awake, alert  and oriented  Airway & Oxygen Therapy: Patient Spontanous Breathing  Post-op Assessment: Report given to PACU RN and Post -op Vital signs reviewed and stable  Post vital signs: Reviewed and stable  Complications: No apparent anesthesia complications

## 2012-06-30 NOTE — Interval H&P Note (Signed)
History and Physical Interval Note:  06/30/2012 12:18 PM  James Park  has presented today for surgery, with the diagnosis of ESRD  The various methods of treatment have been discussed with the patient and family. After consideration of risks, benefits and other options for treatment, the patient has consented to  Procedure(s): INSERTION OF DIALYSIS CATHETER (N/A) as a surgical intervention .  The patient's history has been reviewed, patient examined, no change in status, stable for surgery.  I have reviewed the patient's chart and labs.  Questions were answered to the patient's satisfaction.     [name]

## 2012-06-30 NOTE — Anesthesia Preprocedure Evaluation (Addendum)
Anesthesia Evaluation  Patient identified by MRN, date of birth, ID band Patient awake    Reviewed: Allergy & Precautions, H&P , NPO status , Patient's Chart, lab work & pertinent test results  Airway Mallampati: II TM Distance: >3 FB Neck ROM: Full    Dental  (+) Dental Advisory Given   Pulmonary asthma , COPD COPD inhaler, former smoker (quit in 1970"s),  breath sounds clear to auscultation  Pulmonary exam normal       Cardiovascular hypertension, Pt. on medications + dysrhythmias Atrial Fibrillation Rhythm:Regular Rate:Normal  '12 ECHO: LVH with mild diffuse hypokinesis, EF 50%   Neuro/Psych CVA, No Residual Symptoms    GI/Hepatic Neg liver ROS, GERD-  Medicated and Controlled,  Endo/Other    Renal/GU ARFRenal disease (K+ 4.0, no dialysis yet)negative Renal ROS     Musculoskeletal   Abdominal (+) + obese,   Peds  Hematology   Anesthesia Other Findings   Reproductive/Obstetrics                          Anesthesia Physical Anesthesia Plan  ASA: III  Anesthesia Plan: MAC   Post-op Pain Management:    Induction:   Airway Management Planned: Natural Airway and Simple Face Mask  Additional Equipment:   Intra-op Plan:   Post-operative Plan:   Informed Consent: I have reviewed the patients History and Physical, chart, labs and discussed the procedure including the risks, benefits and alternatives for the proposed anesthesia with the patient or authorized representative who has indicated his/her understanding and acceptance.   Dental advisory given  Plan Discussed with: CRNA and Surgeon  Anesthesia Plan Comments: (Plan routine monitors, MAC)        Anesthesia Quick Evaluation

## 2012-07-01 ENCOUNTER — Encounter (HOSPITAL_COMMUNITY): Payer: Self-pay | Admitting: Vascular Surgery

## 2012-07-08 ENCOUNTER — Encounter: Payer: Self-pay | Admitting: Nephrology

## 2012-07-29 ENCOUNTER — Telehealth: Payer: Self-pay | Admitting: *Deleted

## 2012-07-29 NOTE — Telephone Encounter (Signed)
Dr Arbie Cookey saw Mr Gargis in the hospital on 06/30/12 to insert a hemodialysis catheter. He told me at that time to schedule in 1-2 weeks a Left arm arteriovenous fistula creation; he would not need an office visit prior. On 6/20 I talked with Kennyth Arnold at Dr Susa Griffins office and she was to let me know when Mr Keetch was ready to schedule the surgery. Today I talked with Liborio Nixon at Dr Susa Griffins office and she said the patient is on dialysis but is trying to decide if he wants to do peritoneal dialysis instead. She said they will notify us for further access when a decision was made.

## 2012-08-05 ENCOUNTER — Other Ambulatory Visit (HOSPITAL_COMMUNITY): Payer: Self-pay

## 2012-08-05 DIAGNOSIS — G4730 Sleep apnea, unspecified: Secondary | ICD-10-CM

## 2012-08-15 ENCOUNTER — Ambulatory Visit: Payer: Medicare Other | Attending: Internal Medicine

## 2013-05-02 ENCOUNTER — Ambulatory Visit: Payer: Medicare Other | Attending: Internal Medicine | Admitting: Sleep Medicine

## 2013-05-02 ENCOUNTER — Other Ambulatory Visit (HOSPITAL_COMMUNITY): Payer: Self-pay

## 2013-05-02 ENCOUNTER — Encounter: Payer: Self-pay | Admitting: Neurology

## 2013-05-02 VITALS — Ht 70.0 in | Wt 181.0 lb

## 2013-05-02 DIAGNOSIS — G4733 Obstructive sleep apnea (adult) (pediatric): Secondary | ICD-10-CM | POA: Insufficient documentation

## 2013-05-02 DIAGNOSIS — G4730 Sleep apnea, unspecified: Secondary | ICD-10-CM

## 2013-05-02 DIAGNOSIS — I4891 Unspecified atrial fibrillation: Secondary | ICD-10-CM | POA: Insufficient documentation

## 2013-05-04 ENCOUNTER — Emergency Department (HOSPITAL_COMMUNITY): Payer: Medicare Other

## 2013-05-04 ENCOUNTER — Emergency Department (HOSPITAL_COMMUNITY)
Admission: EM | Admit: 2013-05-04 | Discharge: 2013-05-05 | Discharge status: Against Medical Advice | Payer: Medicare Other | Attending: Emergency Medicine | Admitting: Emergency Medicine

## 2013-05-04 ENCOUNTER — Encounter (HOSPITAL_COMMUNITY): Payer: Self-pay | Admitting: Emergency Medicine

## 2013-05-04 DIAGNOSIS — Z79899 Other long term (current) drug therapy: Secondary | ICD-10-CM | POA: Insufficient documentation

## 2013-05-04 DIAGNOSIS — Z8673 Personal history of transient ischemic attack (TIA), and cerebral infarction without residual deficits: Secondary | ICD-10-CM | POA: Insufficient documentation

## 2013-05-04 DIAGNOSIS — R5381 Other malaise: Secondary | ICD-10-CM | POA: Insufficient documentation

## 2013-05-04 DIAGNOSIS — I4510 Unspecified right bundle-branch block: Secondary | ICD-10-CM | POA: Insufficient documentation

## 2013-05-04 DIAGNOSIS — Z87828 Personal history of other (healed) physical injury and trauma: Secondary | ICD-10-CM | POA: Insufficient documentation

## 2013-05-04 DIAGNOSIS — M129 Arthropathy, unspecified: Secondary | ICD-10-CM | POA: Insufficient documentation

## 2013-05-04 DIAGNOSIS — R001 Bradycardia, unspecified: Secondary | ICD-10-CM

## 2013-05-04 DIAGNOSIS — Z792 Long term (current) use of antibiotics: Secondary | ICD-10-CM | POA: Insufficient documentation

## 2013-05-04 DIAGNOSIS — Z8719 Personal history of other diseases of the digestive system: Secondary | ICD-10-CM | POA: Insufficient documentation

## 2013-05-04 DIAGNOSIS — N189 Chronic kidney disease, unspecified: Secondary | ICD-10-CM | POA: Insufficient documentation

## 2013-05-04 DIAGNOSIS — R5383 Other fatigue: Secondary | ICD-10-CM

## 2013-05-04 DIAGNOSIS — Z8659 Personal history of other mental and behavioral disorders: Secondary | ICD-10-CM | POA: Insufficient documentation

## 2013-05-04 DIAGNOSIS — I129 Hypertensive chronic kidney disease with stage 1 through stage 4 chronic kidney disease, or unspecified chronic kidney disease: Secondary | ICD-10-CM | POA: Insufficient documentation

## 2013-05-04 DIAGNOSIS — I498 Other specified cardiac arrhythmias: Secondary | ICD-10-CM | POA: Insufficient documentation

## 2013-05-04 DIAGNOSIS — Z992 Dependence on renal dialysis: Secondary | ICD-10-CM | POA: Insufficient documentation

## 2013-05-04 LAB — COMPREHENSIVE METABOLIC PANEL
ALK PHOS: 141 U/L — AB (ref 39–117)
ALT: 7 U/L (ref 0–53)
AST: 17 U/L (ref 0–37)
Albumin: 3.6 g/dL (ref 3.5–5.2)
BUN: 97 mg/dL — AB (ref 6–23)
CO2: 24 mEq/L (ref 19–32)
Calcium: 9.2 mg/dL (ref 8.4–10.5)
Chloride: 96 mEq/L (ref 96–112)
Creatinine, Ser: 5.8 mg/dL — ABNORMAL HIGH (ref 0.50–1.35)
GFR calc Af Amer: 9 mL/min — ABNORMAL LOW (ref >90)
GFR calc non Af Amer: 8 mL/min — ABNORMAL LOW (ref >90)
Glucose, Bld: 98 mg/dL (ref 70–99)
Potassium: 4.6 mEq/L (ref 3.7–5.3)
Sodium: 135 mEq/L — ABNORMAL LOW (ref 137–147)
TOTAL PROTEIN: 7.1 g/dL (ref 6.0–8.3)
Total Bilirubin: 0.4 mg/dL (ref 0.3–1.2)

## 2013-05-04 LAB — CBC WITH DIFFERENTIAL/PLATELET
Basophils Absolute: 0.1 10*3/uL (ref 0.0–0.1)
Basophils Relative: 1 % (ref 0–1)
Eosinophils Absolute: 0.6 10*3/uL (ref 0.0–0.7)
Eosinophils Relative: 10 % — ABNORMAL HIGH (ref 0–5)
HCT: 36.9 % — ABNORMAL LOW (ref 39.0–52.0)
HEMOGLOBIN: 12.3 g/dL — AB (ref 13.0–17.0)
Lymphocytes Relative: 9 % — ABNORMAL LOW (ref 12–46)
Lymphs Abs: 0.6 10*3/uL — ABNORMAL LOW (ref 0.7–4.0)
MCH: 29.4 pg (ref 26.0–34.0)
MCHC: 33.3 g/dL (ref 30.0–36.0)
MCV: 88.1 fL (ref 78.0–100.0)
MONOS PCT: 14 % — AB (ref 3–12)
Monocytes Absolute: 0.9 10*3/uL (ref 0.1–1.0)
Neutro Abs: 4.1 10*3/uL (ref 1.7–7.7)
Neutrophils Relative %: 66 % (ref 43–77)
PLATELETS: 177 10*3/uL (ref 150–400)
RBC: 4.19 MIL/uL — ABNORMAL LOW (ref 4.22–5.81)
RDW: 14 % (ref 11.5–15.5)
WBC: 6.3 10*3/uL (ref 4.0–10.5)

## 2013-05-04 LAB — PROTIME-INR
INR: 1.12 (ref 0.00–1.49)
Prothrombin Time: 14.2 seconds (ref 11.6–15.2)

## 2013-05-04 LAB — PRO B NATRIURETIC PEPTIDE: PRO B NATRI PEPTIDE: 20120 pg/mL — AB (ref 0–450)

## 2013-05-04 LAB — TROPONIN I

## 2013-05-04 MED ORDER — ATROPINE SULFATE 1 MG/ML IJ SOLN
INTRAMUSCULAR | Status: AC
  Administered 2013-05-04: 1 mg
  Filled 2013-05-04: qty 1

## 2013-05-04 MED ORDER — SODIUM CHLORIDE 0.9 % IV SOLN
1.0000 g | Freq: Once | INTRAVENOUS | Status: AC
  Administered 2013-05-04: 1 g via INTRAVENOUS
  Filled 2013-05-04: qty 10

## 2013-05-04 NOTE — ED Notes (Signed)
Pt c/o high blood pressure and low heart rate.

## 2013-05-04 NOTE — ED Provider Notes (Signed)
CSN: [id number]     Arrival date & time 05/04/13  2116 History  This chart was scribed for James OctaveStephen Leah Skora, MD by Bennett Scrapehristina Taylor, ED Scribe. This patient was seen in room APA02/APA02 and the patient's care was started at 9:40 PM.     Chief Complaint  Patient presents with  . Hypertension      The history is provided by the patient. No language interpreter was used.   HPI Comments: James Park is a 80 y.o. male who presents to the Emergency Department complaining of bradycardia noted while measuring his BP at drug store tonight. His HR in the ED tonight is 44. Currently, the pt states that he feels fatigued. He states that he called his PCP, Dr. Juanetta GoslingHawkins, and was advised to come to the ED for his symptoms. He reports that his Losartan dose was increased and he was started on Doxazosin one week ago. He denies overdose on medications and states that he skipped his Doxazosin today. He reports that he was on dialysis for kidney failure but states that his kidney function improved and he was taken off 1 year ago. He admits that he was diagnosed with A. Fib 2 years ago by his PCP but [facility] and was told that he didn't have it after a stress test. He denies any CP, SOB, HA, dizziness or lightheadedness.    Past Medical History  Diagnosis Date  . Atrial fibrillation 02/2011    EKG from Dr. Alonza SmokerFagan's office reviewed demonstrating AF with ventricular rate of 70 bpm and occasional PVCs versus aberrancy; possible prior septal MI; low voltage, ST-T wave abnormality consistent with LVH or anterolateral ischemia  . Right bundle branch block   . Hypertension   . Anxiety   . Depression   . Constipation   . GERD (gastroesophageal reflux disease)     doesnt eat after 1530 - 1600  . Arthritis   . Urinary retention     does self cath, approx 4 times a day.  . Rotator cuff tear     right- not repaired.  . Stroke 06/10/12    gait disturbance only residual   Past Surgical  History  Procedure Laterality Date  . Knee surgery      tendon repair  . Prostate enlargement.  self caths at times    . Eye surgery Bilateral     Cataract  . Insertion of dialysis catheter Right 06/30/2012    Procedure: INSERTION OF DIALYSIS CATHETER right IJ;  Surgeon: Larina Earthlyodd F Early, MD;  [facility];  Service: Vascular;  Laterality: Right;   Family History  Problem Relation Age of Onset  . Parkinson's disease Brother    History  Substance Use Topics  . Smoking status: Never Smoker   . Smokeless tobacco: Not on file     Comment: quit 1967  . Alcohol Use: 12.6 oz/week    21 Cans of beer per week     Comment: 3 in past 3 weeks    Review of Systems  A complete 10 system review of systems was obtained and all systems are negative except as noted in the HPI and PMH.    Allergies  Altace  Home Medications   Prior to Admission medications   Medication Sig Start Date End Date Taking? Authorizing Provider  albuterol (PROVENTIL) (2.5 MG/3ML) 0.083% nebulizer solution Take 2.5 mg by nebulization every 6 (six) hours as needed for wheezing or shortness of breath.    Historical  Provider, MD  cefUROXime (CEFTIN) 250 MG tablet Take 2 tablets (500 mg total) by mouth 2 (two) times daily. 06/13/12   Milana ObeyStephen D Knowlton, MD  furosemide (LASIX) 20 MG tablet Take 80 mg by mouth 2 (two) times daily.     Historical Provider, MD  HYDROcodone-acetaminophen (VICODIN) 5-325 mg TABS 1 tab po q 6 PRN for pain 06/30/12   Lars MageEmma M Collins, PA-C  losartan (COZAAR) 50 MG tablet Take one or two tablets each day for high blood pressure, as directed by MD. 06/13/12   Milana ObeyStephen D Knowlton, MD   Triage vitals: BP 199/74  Pulse 48  Temp(Src) 97.9 F (36.6 C) (Oral)  Resp 16  Ht 5\' 10"  (1.778 m)  Wt 175 lb (79.379 kg)  BMI 25.11 kg/m2  SpO2 97%  Physical Exam  Nursing note and vitals reviewed. Constitutional: He is oriented to person, place, and time. He appears well-developed and well-nourished. No distress.   HENT:  Head: Normocephalic and atraumatic.  Eyes: EOM are normal.  Neck: Neck supple. No tracheal deviation present.  Cardiovascular: An irregular rhythm present. Bradycardia present.   Pulmonary/Chest: Effort normal. No respiratory distress.  Musculoskeletal: Normal range of motion.  Neurological: He is alert and oriented to person, place, and time.  Skin: Skin is warm and dry.  Psychiatric: He has a normal mood and affect. His behavior is normal.    ED Course  Procedures (including critical care time)  Medications  calcium gluconate 1 g in sodium chloride 0.9 % 100 mL IVPB (0 g Intravenous Stopped 05/04/13 2203)  atropine 1 MG/ML injection (1 mg  Given 05/04/13 2154)    DIAGNOSTIC STUDIES: Oxygen Saturation is 97% on RA, adequate by my interpretation.    COORDINATION OF CARE: 9:52 PM-HR increased from 44 to 52 with 0.5 mg atropine. Verbally ordered 0.5 mg atropine. Discussed treatment plan which includes cardiac monitoring, CXR, CBC panel, CMP and troponin with pt at bedside and pt agreed to plan.    Labs Review Labs Reviewed  CBC WITH DIFFERENTIAL - Abnormal; Notable for the following:    RBC 4.19 (*)    Hemoglobin 12.3 (*)    HCT 36.9 (*)    Lymphocytes Relative 9 (*)    Lymphs Abs 0.6 (*)    Monocytes Relative 14 (*)    Eosinophils Relative 10 (*)    All other components within normal limits  COMPREHENSIVE METABOLIC PANEL - Abnormal; Notable for the following:    Sodium 135 (*)    BUN 97 (*)    Creatinine, Ser 5.80 (*)    Alkaline Phosphatase 141 (*)    GFR calc non Af Amer 8 (*)    GFR calc Af Amer 9 (*)    All other components within normal limits  PRO B NATRIURETIC PEPTIDE - Abnormal; Notable for the following:    Pro B Natriuretic peptide (BNP) 20120.0 (*)    All other components within normal limits  TROPONIN I  PROTIME-INR    Imaging Review Dg Chest Portable 1 View  05/04/2013   CLINICAL DATA:  Hypertension  EXAM: PORTABLE CHEST - 1 VIEW  COMPARISON:   DG CHEST 1V PORT dated 06/30/2012  FINDINGS: Cardiac enlargement without vascular congestion or edema. No focal airspace disease or consolidation in the lungs. No blunting of costophrenic angles. No pneumothorax. Degenerative changes in the spine and shoulders. Interval removal of dialysis catheter. Otherwise, no change since prior study.  IMPRESSION: Cardiac enlargement.  No evidence of active pulmonary disease appear  Electronically Signed   By: Burman Nieves M.D.   On: 05/04/2013 22:19     EKG Interpretation None      MDM   Final diagnoses:  Bradycardia   Patient presents from drug store with bradycardia and hypertension. Denies any chest pain or shortness of breath.  Potassium 4.6. Creatinine 5.8 which is stable. Patient states he is no longer on dialysis.  Patient had somewhat irregular bradycardia with rates as low as 38. He denies any dizziness, chest pain, shortness of breath. When atropine, rate increased to the 70s  Case discussed with Dr. Adolm Joseph with cardiology. He feels patient has atrial fibrillation and recommend admission for ongoing bradycardia. He thinks patient may require pacemaker. Recommend stopping diltiazem. He feels patient can be admitted to [facility] to have a cardiology consult tomorrow.  Patient adamantly refuses to be admitted. He understands he is at risk for having a serious arrhythmia which may cause heart attack or death. He is alert and oriented x3. His son-in-law is in the room and is unable to convince him to stay either. He states he is 80 years old and if he dies, he dies. He has evidence of RBBB and atrial fibrillation which suggests underlying conduction disease. He also has very poor renal function. Patient able to repeat back risks of leaving against medical advice and appears to have capacity to make decisions.  Advised to stop diltiazem and followup with cardiology as soon as possible. He is encouraged to return to the ED for reevaluation at  any time.    Date: 05/04/2013  Rate: 43  Rhythm: atrial fibrillation  QRS Axis: normal  Intervals: QT prolonged  ST/T Wave abnormalities: nonspecific ST/T changes  Conduction Disutrbances:right bundle branch block and left anterior fascicular block  Narrative Interpretation:   Old EKG Reviewed: unchanged   Date: 05/04/2013  Rate: 69  Rhythm: atrial fibrillation  QRS Axis: normal  Intervals: QT prolonged  ST/T Wave abnormalities: nonspecific ST/T changes  Conduction Disutrbances:right bundle branch block and left anterior fascicular block  Narrative Interpretation:   Old EKG Reviewed: unchanged  CRITICAL CARE Performed by: James Park Total critical care time: 30 Critical care time was exclusive of separately billable procedures and treating other patients. Critical care was necessary to treat or prevent imminent or life-threatening deterioration. Critical care was time spent personally by me on the following activities: development of treatment plan with patient and/or surrogate as well as nursing, discussions with consultants, evaluation of patient's response to treatment, examination of patient, obtaining history from patient or surrogate, ordering and performing treatments and interventions, ordering and review of laboratory studies, ordering and review of radiographic studies, pulse oximetry and re-evaluation of patient's condition.   BP 172/66  Pulse 51  Temp(Src) 97.9 F (36.6 C) (Oral)  Resp 14  Ht 5\' 10"  (1.778 m)  Wt 175 lb (79.379 kg)  BMI 25.11 kg/m2  SpO2 99%  I personally performed the services described in this documentation, which was scribed in my presence. The recorded information has been reviewed and is accurate.     James Octave, MD 05/05/13 [phone]

## 2013-05-04 NOTE — Discharge Instructions (Signed)
Cardiac Arrhythmia You are leaving against medical advice.  You have a slow heart rhythm and may need a pacemaker. Stop taking diltiazem. Follow up with the heart doctor as soon as possible.  Return to the ED if you wish to be reevaluated. Your heart is a muscle that works to pump blood through your body by regular contractions. The beating of your heart is controlled by a system of special pacemaker cells. These cells control the electrical activity of the heart. When the system controlling this regular beating is disturbed, a heart rhythm abnormality (arrhythmia) results. WHEN YOUR HEART SKIPS A BEAT One of the most common and least serious heart arrhythmias is called an ectopic or premature atrial heartbeat (PAC). This may be noticed as a small change in your regular pulse. A PAC originates from the top part (atrium) of the heart. Within the right atrium, the SA node is the area that normally controls the regularity of the heart. PACs occur in heart tissue outside of the SA node region. You may feel this as a skipped beat or heart flutter, especially if several occur in succession or occur frequently.  Another arrhythmia is ventricular premature complex (VCP or PVC). These extra beats start out in the bottom, more muscular chambers of the heart. In most cases a PVC is harmless. If there are underlying causes that are making the heart irritable such as an overactive thyroid or a prior heart attack PVCs may be of more concern. In a few cases, medications to control the heart rhythm may be prescribed. Things to try at home:  Cut down or avoid alcohol, tobacco and caffeine.  Get enough sleep.  Reduce stress.  Exercise more. WHEN THE HEART BEATS TOO FAST Atrial tachycardia is a fast heart rate, which starts out in the atrium. It may last from minutes to much longer. Your heart may beat 140 to 240 times per minute instead of the normal 60 to 100.  Symptoms include a worried feeling (anxiety) and a  sense that your heart is beating fast and hard.  You may be able to stop the fast rate by holding your breath or bearing down as if you were going to have a bowel movement.  This type of fast rate is usually not dangerous. Atrial fibrillation and atrial flutter are other fast rhythms that start in the atria. Both conditions keep the atria from filling with enough blood so the heart does not work well.  Symptoms include feeling light-headed or faint.  These fast rates may be the result of heart damage or disease. Too much thyroid hormone may play a role.  There may be no clear cause or it may be from heart disease or damage.  Medication or a special electrical treatment (cardioversion) may be needed to get the heart beating normally. Ventricular tachycardia is a fast heart rate that starts in the lower muscular chambers (ventricles) This is a serious disorder that requires treatment as soon as possible. You need someone else to get and use a small defibrillator.  Symptoms include collapse, chest pain, or being short of breath.  Treatment may include medication, procedures to improve blood flow to the heart, or an implantable cardiac defibrillator (ICD). DIAGNOSIS   A cardiogram (EKG or ECG) will be done to see the arrhythmia, as well as lab tests to check the underlying cause.  If the extra beats or fast rate come and go, you may wear a Holter monitor that records your heart rate for a longer  period of time. SEEK MEDICAL CARE IF:  You have irregular or fast heartbeats (palpitations).  You experience skipped beats.  You develop lightheadedness.  You have chest discomfort.  You have shortness of breath.  You have more frequent episodes, if you are already being treated. SEEK IMMEDIATE MEDICAL CARE IF:   You have severe chest pain, especially if the pain is crushing or pressure-like and spreads to the arms, back, neck, or jaw, or if you have sweating, feeling sick to your stomach  (nausea), or shortness of breath. THIS IS AN EMERGENCY. Do not wait to see if the pain will go away. Get medical help at once. Call 911 or 0 (operator). DO NOT drive yourself to the hospital.  You feel dizzy or faint.  You have episodes of previously documented atrial tachycardia that do not resolve with the techniques your caregiver has taught you.  Irregular or rapid heartbeats begin to occur more often than in the past, especially if they are associated with more pronounced symptoms or of longer duration. Document Released: 12/30/2004 Document Revised: 03/24/2011 Document Reviewed: 08/18/2007 [facility] Patient Information 2014 [employer] [place].

## 2013-05-06 ENCOUNTER — Ambulatory Visit (INDEPENDENT_AMBULATORY_CARE_PROVIDER_SITE_OTHER): Payer: Medicare Other | Admitting: Cardiology

## 2013-05-06 ENCOUNTER — Encounter: Payer: Self-pay | Admitting: Cardiology

## 2013-05-06 VITALS — BP 149/53 | HR 43 | Ht 70.0 in | Wt 175.0 lb

## 2013-05-06 DIAGNOSIS — I495 Sick sinus syndrome: Secondary | ICD-10-CM

## 2013-05-06 DIAGNOSIS — I10 Essential (primary) hypertension: Secondary | ICD-10-CM

## 2013-05-06 DIAGNOSIS — R0602 Shortness of breath: Secondary | ICD-10-CM

## 2013-05-06 DIAGNOSIS — N185 Chronic kidney disease, stage 5: Secondary | ICD-10-CM

## 2013-05-06 DIAGNOSIS — I4891 Unspecified atrial fibrillation: Secondary | ICD-10-CM

## 2013-05-06 DIAGNOSIS — I4589 Other specified conduction disorders: Secondary | ICD-10-CM

## 2013-05-06 NOTE — Assessment & Plan Note (Signed)
Followed by Dr. Kristian CoveyBefekadu. Patient tells that he was on hemodialysis for 3 months, and was then able to stop. Reports relatively stable renal function. Recent potassium normal.

## 2013-05-06 NOTE — Assessment & Plan Note (Signed)
Conduction system disease evident by ECG with right bundle branch block and left anterior fascicular block at baseline.

## 2013-05-06 NOTE — Assessment & Plan Note (Signed)
Long-standing diagnosis based on review of tracings since 2013. As noted above, he had a hemorrhagic stroke last year, and would not be a candidate for anticoagulation, in fact had refused in the past anyway. He is not even on aspirin at this point. Heart rate is slow, certainly agree with stopping diltiazem CD or use of any other rate lowering medications. I wonder how much of his fatigue and weakness is related to bradycardia and potentially chronotropic incompetence. I have asked him to continue his regular activities and see if he feels any better after he has been off diltiazem for a longer period of time. Also plan to arrange a GXT to formally assess for chronotropic incompetence. An echocardiogram is being obtained as well to assess cardiac structure and function, particularly in light of his symptoms and markedly elevated pro-BNP. He does not endorse any history of cardiomyopathy. Plan to bring him back to the office to go over the results and discuss the next step.

## 2013-05-06 NOTE — Assessment & Plan Note (Signed)
Currently on Cozaar, followed by Dr. Ouida SillsFagan.

## 2013-05-06 NOTE — Patient Instructions (Addendum)
Your physician recommends that you schedule a follow-up appointment in: 2 - 3 weeks    Your physician has requested that you have an echocardiogram. Echocardiography is a painless test that uses sound waves to create images of your heart. It provides your doctor with information about the size and shape of your heart and how well your heart's chambers and valves are working. This procedure takes approximately one hour. There are no restrictions for this procedure.   Your physician has requested that you have an exercise tolerance test. For further information please visit [email or web address]. Please also follow instruction sheet, as given.    Thank you for choosing [facility] !

## 2013-05-06 NOTE — Progress Notes (Signed)
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 Manus Gunningancour after [facility]. Primary care physician is Dr. Ouida SillsFagan. I reviewed available records. He was seen on April 22 related to bradycardia. ECGs reviewed back to 2013. He has been diagnosed with atrial fibrillation and relatively slow ventricular response in the past, also aberrant conduction versus occasional PVC. Most recent tracing does not exclude atrial fibrillation with underlying heart block and junctional escape with old right bundle branch block and left anterior fascicular block with fairly wide QRS. He was on diltiazem at that time, and also has renal insufficiency with creatinine 5.8 - previously on hemodialysis for 3 months (still sees Dr. Kristian CoveyBefekadu). Recommendation was hospital admission for further evaluation, however the patient refused.  Recent lab work showed potassium 4.5, BUN 97, creatinine 5.8, hemoglobin 12.3, platelets 177, troponin I less than 0.30, markedly elevated pro BNP 20120. Chest x-ray indicated cardiomegaly without vascular congestion or edema.  He presents today describing a chronic feeling of fatigue and lack of energy. Has also had prior episodes where he has fallen asleep easily, no frank syncope. He has in fact just recently undergone a sleep study on Monday, results pending. He does not endorse any sense of palpitations, has no chest pain. [facility]. He does not feel any different after stopping diltiazem, however this was just a few days ago.  He tells me that he underwent cardiac testing a year ago after [facility] ([facility]), sounds like he had an echocardiogram and some type of stress testing, does not recall any major abnormalities. I do not have these records.  He explains that he was aware of the diagnosis of atrial fibrillation years ago prior to his stroke last year, however declined use of  anticoagulants. It is noted that his stroke was a hemorrhagic stroke. He has not been on aspirin either.   Allergies  Allergen Reactions  . Altace [Ramipril] Hives    Current Outpatient Prescriptions  Medication Sig Dispense Refill  . albuterol (PROVENTIL) (2.5 MG/3ML) 0.083% nebulizer solution Take 2.5 mg by nebulization every 6 (six) hours as needed for wheezing or shortness of breath.      . doxazosin (CARDURA) 1 MG tablet Take 1 mg by mouth daily.      Marland Kitchen. losartan (COZAAR) 50 MG tablet Take 50 mg by mouth 2 (two) times daily.      . Melatonin 5 MG TABS Take 5 mg by mouth at bedtime as needed (Sleep).      . sodium bicarbonate 650 MG tablet Take 650 mg by mouth daily.       No current facility-administered medications for this visit.    Past Medical History  Diagnosis Date  . Atrial fibrillation 02/2011    Not anticoagulated  . Right bundle branch block   . Essential hypertension, benign   . Anxiety   . Depression   . Constipation   . GERD (gastroesophageal reflux disease)   . Arthritis   . Urinary retention     Self-catheterization when necessary  . Rotator cuff tear     Right - not repaired  . Stroke 05/2012    Gait disturbance only residual - hemorrhagic stroke  . CKD (chronic kidney disease) stage 5, GFR less than 15 ml/min     Previously on hemodialysis    Past Surgical History  Procedure Laterality Date  . Knee surgery  Tendon repair  . Eye surgery Bilateral     Cataract  . Insertion of dialysis catheter Right 06/30/2012    Procedure: INSERTION OF DIALYSIS CATHETER right IJ;  Surgeon: Larina Earthlyodd F Early, MD;  [facility];  Service: Vascular;  Laterality: Right;    Family History  Problem Relation Age of Onset  . Parkinson's disease Brother     Social History James Park reports that he has never smoked. He does not have any smokeless tobacco history on file. James Park reports that he drinks about 12.6 ounces of alcohol per week.  Review of Systems Has  been losing some weight around his waist line although reports stable appetite. No melena or hematochezia. Unpleasant feeling in his head in the mornings like a "hangover." Does not call these headaches. No frank syncope. Otherwise as outlined above.  Physical Examination Filed Vitals:   05/06/13 1117  BP: 149/53  Pulse: 43   Filed Weights   05/06/13 1117  Weight: 175 lb (79.379 kg)   Elderly male, appears comfortable at rest. HEENT: Conjunctiva and lids normal, oropharynx clear. Neck: Supple, no elevated JVP or carotid bruits, no thyromegaly. Lungs: Clear to auscultation, nonlabored breathing at rest. Cardiac: Irregularly irregular, indistinct PMI, no S3, soft systolic murmur, no pericardial rub. Abdomen: Soft, nontender, bowel sounds present, no guarding or rebound. Extremities: Trace edema, distal pulses 1-2+. Skin: Warm and dry. Musculoskeletal: No kyphosis. Neuropsychiatric: Alert and oriented x3, affect grossly appropriate.   Problem List and Plan   Atrial fibrillation Long-standing diagnosis based on review of tracings since 2013. As noted above, he had a hemorrhagic stroke last year, and would not be a candidate for anticoagulation, in fact had refused in the past anyway. He is not even on aspirin at this point. Heart rate is slow, certainly agree with stopping diltiazem CD or use of any other rate lowering medications. I wonder how much of his fatigue and weakness is related to bradycardia and potentially chronotropic incompetence. I have asked him to continue his regular activities and see if he feels any better after he has been off diltiazem for a longer period of time. Also plan to arrange a GXT to formally assess for chronotropic incompetence. An echocardiogram is being obtained as well to assess cardiac structure and function, particularly in light of his symptoms and markedly elevated pro-BNP. He does not endorse any history of cardiomyopathy. Plan to bring him back to the  office to go over the results and discuss the next step.  SSS (sick sinus syndrome) Conduction system disease evident by ECG with right bundle branch block and left anterior fascicular block at baseline.  Essential hypertension, benign Currently on Cozaar, followed by Dr. Ouida SillsFagan.  CKD (chronic kidney disease) stage 5, GFR less than 15 ml/min Followed by Dr. Kristian CoveyBefekadu. Patient tells that he was on hemodialysis for 3 months, and was then able to stop. Reports relatively stable renal function. Recent potassium normal.    Jonelle SidleSamuel G. Erwin Nishiyama, M.D., F.A.C.C.

## 2013-05-09 NOTE — Sleep Study (Signed)
  [facility] Mable Lashley A. Gerilyn Pilgrimoonquah, MD     [email or web address]        NOCTURNAL POLYSOMNOGRAM    LOCATION: SLEEP LAB FACILITY: [facility]   PHYSICIAN: Zaydah Nawabi A. Gerilyn Pilgrimoonquah, M.D.   DATE OF STUDY: 05/02/2013.   REFERRING PHYSICIAN: Carylon Perchesoy Fagan.  INDICATIONS: This is an 80 year old male who complains of fatigue, snoring and difficulty sleeping.  MEDICATIONS:  Prior to Admission medications   Medication Sig Start Date End Date Taking? Authorizing Provider  albuterol (PROVENTIL) (2.5 MG/3ML) 0.083% nebulizer solution Take 2.5 mg by nebulization every 6 (six) hours as needed for wheezing or shortness of breath.    Historical Provider, MD  doxazosin (CARDURA) 1 MG tablet Take 1 mg by mouth daily.    Historical Provider, MD  losartan (COZAAR) 50 MG tablet Take 50 mg by mouth 2 (two) times daily. 06/13/12   Milana ObeyStephen D Knowlton, MD  Melatonin 5 MG TABS Take 5 mg by mouth at bedtime as needed (Sleep).    Historical Provider, MD  sodium bicarbonate 650 MG tablet Take 650 mg by mouth daily.    Historical Provider, MD      EPWORTH SLEEPINESS SCALE: 2.   BMI: 26.   ARCHITECTURAL SUMMARY: Total recording time was 365 minutes. Sleep efficiency 74 %. Sleep latency 5 minutes. REM latency 226 minutes. Stage NI 5 %, N2 76 % and N3 7 % and REM sleep 11 %.    RESPIRATORY DATA:  Baseline oxygen saturation is 92 %. The lowest saturation is 82 %. The diagnostic AHI is 6. The RDI is 7. The REM AHI is 20.  LIMB MOVEMENT SUMMARY: PLM index 125.   ELECTROCARDIOGRAM SUMMARY: Average heart rate is 40. Atrial fibrillation/atrial flutter is noted throughout the recording.   IMPRESSION:  1. Severe periodic limb movement disorder of sleep. Consider dopamine agonist such as Requip or Mirapex. 2. Mild REM-related obstructive sleep apnea syndrome not requiring positive pressure treatment. 3. Atrial fibrillation/atrial flutter.    Thanks for this referral.  Keishaun Hazel A. Gerilyn Pilgrimoonquah, M.D. Diplomat, Biomedical engineerAmerican Board of  Sleep Medicine.

## 2013-05-12 ENCOUNTER — Ambulatory Visit (HOSPITAL_COMMUNITY)
Admission: RE | Admit: 2013-05-12 | Discharge: 2013-05-12 | Disposition: A | Discharge status: Home / Self Care | Payer: Medicare Other | Source: Ambulatory Visit | Attending: Cardiology | Admitting: Cardiology

## 2013-05-12 DIAGNOSIS — R0609 Other forms of dyspnea: Secondary | ICD-10-CM

## 2013-05-12 DIAGNOSIS — R0989 Other specified symptoms and signs involving the circulatory and respiratory systems: Secondary | ICD-10-CM

## 2013-05-12 DIAGNOSIS — I10 Essential (primary) hypertension: Secondary | ICD-10-CM | POA: Insufficient documentation

## 2013-05-12 DIAGNOSIS — I495 Sick sinus syndrome: Secondary | ICD-10-CM | POA: Insufficient documentation

## 2013-05-12 DIAGNOSIS — I4589 Other specified conduction disorders: Secondary | ICD-10-CM

## 2013-05-12 DIAGNOSIS — I4510 Unspecified right bundle-branch block: Secondary | ICD-10-CM | POA: Insufficient documentation

## 2013-05-12 DIAGNOSIS — R5381 Other malaise: Secondary | ICD-10-CM | POA: Insufficient documentation

## 2013-05-12 DIAGNOSIS — R5383 Other fatigue: Secondary | ICD-10-CM

## 2013-05-12 DIAGNOSIS — I369 Nonrheumatic tricuspid valve disorder, unspecified: Secondary | ICD-10-CM

## 2013-05-12 DIAGNOSIS — R0602 Shortness of breath: Secondary | ICD-10-CM | POA: Insufficient documentation

## 2013-05-12 DIAGNOSIS — I4891 Unspecified atrial fibrillation: Secondary | ICD-10-CM | POA: Insufficient documentation

## 2013-05-12 NOTE — Progress Notes (Signed)
*  PRELIMINARY RESULTS* Echocardiogram 2D Echocardiogram has been performed.  Katheren PullerJohanna R Shalandria Elsbernd 05/12/2013, 10:12 AM

## 2013-05-12 NOTE — Progress Notes (Addendum)
Stress Lab Nurses Notes - James Park  James Park 05/12/2013 Reason for doing test: Dyspnea and fatigue Type of test: Regular GTX Nurse performing test: James PoissonPhyllis Billingsly, RN Nuclear Medicine Tech: Not Applicable Echo Tech: Not Applicable MD performing test: Koneswaran/K.Lawrence NP Family MD: Ouida SillsFagan Test explained and consent signed: yes IV started: No IV started Symptoms: Fatigue in legs Treatment/Intervention: None Reason test stopped: fatigue in legs After recovery IV was: NA Patient to return to Nuc. Med at : NA Patient discharged: Home Patient's Condition upon discharge was: stable Comments: During test peak BP 168/58 & HR 72.  Recovery BP 160/60 & HR 36.  Symptoms resolved in recovery.  James Park  Attending Note The patient exercised according to the Bruce protocol for 2 min 28 sec, achieving a workload of 4.60 METs. Heart increased from 39 bpm up to 72 bpm, representing 51% of maximal age predicted heart rate. Blood pressure increased from 142/62 up to 160/60. The test was stopped due to fatigue and leg discomfort.  Baseline EKG showed junctional rhythm with RBBB and LAFB rate of 39 with baseline downsloping ST depressions in the inferior limb leads and throughout the precoridal leads.  Heavy artifact and baseline ST/T changes make the test non-diagnostic for ischemia. Heart increased to rate of 72 bpm with no discernible P-waves, appears increased rate of his baseline junctional rhythm.  Conclusions 1. Non-diagnostic exercise stress test due to baseline ST/T changes, heavy stress artifact, and patient not reaching target heart rate.  2. Baseline junctional rhythm rate of 39 increased to 72 with exercise (52% of THR), faster rhythm appears to be accelerated junctional rhythm.  Dina RichJonathan Maevis Mumby MD

## 2013-05-19 ENCOUNTER — Telehealth: Payer: Self-pay | Admitting: *Deleted

## 2013-05-19 NOTE — Telephone Encounter (Signed)
Please see the GXT report. He does have evidence of chronotropic incompetence (heart rate does not increase adequately with exercise and this may be causing some of his fatigue). In addition his echocardiogram shows severe pulmonary hypertension (elevated pressures in the lungs) which is also playing a part in his symptoms. I see that his recent sleep study suggested only mild findings, and CPAP was not recommended. Let him know that we are going to have to discuss these issues and he will likely require further evaluation. I will likely have him see Dr. Gala RomneyBensimhon for right heart catheterization and further assessment of pulmonary hypertension, also EP for discussion of whether a pacemaker might help him to some degree.

## 2013-05-20 ENCOUNTER — Ambulatory Visit (INDEPENDENT_AMBULATORY_CARE_PROVIDER_SITE_OTHER): Payer: Medicare Other | Admitting: Cardiology

## 2013-05-20 ENCOUNTER — Encounter: Payer: Self-pay | Admitting: *Deleted

## 2013-05-20 ENCOUNTER — Encounter: Payer: Self-pay | Admitting: Cardiology

## 2013-05-20 ENCOUNTER — Other Ambulatory Visit: Payer: Self-pay | Admitting: Cardiology

## 2013-05-20 VITALS — BP 132/50 | HR 46 | Ht 70.0 in | Wt 180.0 lb

## 2013-05-20 DIAGNOSIS — I4891 Unspecified atrial fibrillation: Secondary | ICD-10-CM

## 2013-05-20 DIAGNOSIS — N185 Chronic kidney disease, stage 5: Secondary | ICD-10-CM

## 2013-05-20 DIAGNOSIS — I10 Essential (primary) hypertension: Secondary | ICD-10-CM

## 2013-05-20 DIAGNOSIS — I272 Pulmonary hypertension, unspecified: Secondary | ICD-10-CM

## 2013-05-20 DIAGNOSIS — I2789 Other specified pulmonary heart diseases: Secondary | ICD-10-CM

## 2013-05-20 DIAGNOSIS — I495 Sick sinus syndrome: Secondary | ICD-10-CM

## 2013-05-20 NOTE — Assessment & Plan Note (Signed)
Followed by Dr. Kristian CoveyBefekadu. Patient was on hemodialysis for 3 months, and was then able to stop. Reports relatively stable renal function. Recent potassium normal. Followup labs pending. Would not pursue coronary angiography due high risk for contrast nephropathy.

## 2013-05-20 NOTE — Assessment & Plan Note (Signed)
Patient with recently documented junctional rhythm and also slow atrial fibrillation. GXT as noted above is consistent with chronotropic incompetence, and I suspect that this also plays a role in his symptoms. EP consultation may be pursued to at least discuss whether a pacemaker might lead to some improvement in his quality of life, although would further evaluate pulmonary hypertension first. I discussed this with the patient and his daughter today.

## 2013-05-20 NOTE — Assessment & Plan Note (Signed)
Severe by recent echocardiogram with PASP 90 mm mercury. He has grade 2 diastolic dysfunction, mild to moderate RV dilatation and dysfunction. No left-sided valvular lesions to explain this. No known history of chronic lung disease or thromboembolic disease. Recent sleep testing demonstrated only mild abnormalities and in fact CPAP was not recommended. I have recommended that the patient be evaluated by Dr. Gala RomneyBensimhon and undergone right heart catheterization to better define his pulmonary pressures, and more specifically to help determine if there any treatments that might help reverse this or at least improve his quality of life. I discussed this with the patient and his daughter present, he is in agreement to proceed next week. Lab work will be obtained on the day of the procedure.

## 2013-05-20 NOTE — Assessment & Plan Note (Signed)
Long-standing diagnosis based on review of tracings since 2013. He had a hemorrhagic stroke last year, and would not be a candidate for anticoagulation, in fact has refused in any event. He is not even on aspirin at this point. 

## 2013-05-20 NOTE — Patient Instructions (Signed)
Your physician recommends that you schedule a follow-up appointment in: to be determined  Your physician has requested that you have a cardiac catheterization. Cardiac catheterization is used to diagnose and/or treat various heart conditions. Doctors may recommend this procedure for a number of different reasons. The most common reason is to evaluate chest pain. Chest pain can be a symptom of coronary artery disease (CAD), and cardiac catheterization can show whether plaque is narrowing or blocking your heart's arteries. This procedure is also used to evaluate the valves, as well as measure the blood flow and oxygen levels in different parts of your heart. For further information please visit [email or web address]. Please follow instruction sheet, as given.

## 2013-05-20 NOTE — Assessment & Plan Note (Signed)
Systemic blood pressure mildly elevated today.

## 2013-05-20 NOTE — Progress Notes (Signed)
Clinical Summary 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 He was referred for testing to assess for potential chronotropic incompetence with documented slow atrial fibrillation and junctional rhythm, also for further evaluation of shortness of breath. He had already been taken off of diltiazem CD.  GXT was obtained, baseline rhythm was described as a junctional rhythm at 39 beats per minute, increased to only 72 beats per minute with exercise (52% MPHR). Systolic blood pressure increased from 140-160. He stopped due to fatigue and leg discomfort, no chest pain. Overall nondiagnostic study for ischemia, although consistent with chronotropic incompetence.  Echocardiogram was also obtained demonstrating moderate LVH with LVEF 50-55%, grade 2 diastolic dysfunction, septal motion consistent with intraventricular conduction delay, probable mild aortic stenosis (mean gradient 4 mm mercury), moderate left atrial enlargement, mildly to moderately dilated right ventricle with mildly to moderately reduced function, severe right atrial enlargement, thickened tricuspid leaflets with moderate regurgitation and a calculated PASP of 90 mmHg.  [name] was here with his daughter today. We had a detailed discussion regarding test results and implications. He indicates frustration, wishes that he could just "feel better." He has not noticed any significant improvement since stopping diltiazem CD. Complains of long-term fatigue, poor stamina, no chest pain.  I made it clear to [name] today that I did not feel as if there was a single cause for his symptomatology, more than likely multifactorial in light of the above findings and also severe chronic kidney disease. We discussed options including referral to Dr. Gala Park for a right heart catheterization to better define his pulmonary hypertension and determine if there are  any additional options that might help him improve his quality of life. We also discussed perhaps an EP referral after the above is accomplished for discussion of whether a pacemaker might lead to some symptomatic improvement.   Allergies  Allergen Reactions  . Altace [Ramipril] Hives    Current Outpatient Prescriptions  Medication Sig Dispense Refill  . albuterol (PROVENTIL) (2.5 MG/3ML) 0.083% nebulizer solution Take 2.5 mg by nebulization every 6 (six) hours as needed for wheezing or shortness of breath.      . doxazosin (CARDURA) 1 MG tablet Take 1 mg by mouth daily.      . furosemide (LASIX) 20 MG tablet Take 20 mg by mouth as needed (1 tab daily as needed for wt gain over 3 lbs).      . losartan (COZAAR) 50 MG tablet Take 50 mg by mouth 2 (two) times daily.      . Melatonin 5 MG TABS Take 5 mg by mouth at bedtime as needed (Sleep).      . sodium bicarbonate 650 MG tablet Take 650 mg by mouth daily.       No current facility-administered medications for this visit.    Past Medical History  Diagnosis Date  . Atrial fibrillation 02/2011    Not anticoagulated  . Right bundle branch block   . Essential hypertension, benign   . Anxiety   . Depression   . Constipation   . GERD (gastroesophageal reflux disease)   . Arthritis   . Urinary retention     Self-catheterization when necessary  . Rotator cuff tear     Right - not repaired  . Stroke 05/2012    Gait disturbance only residual - hemorrhagic stroke  . CKD (chronic kidney disease) stage 5, GFR less than  15 ml/min     Previously on hemodialysis    Past Surgical History  Procedure Laterality Date  . Knee surgery      Tendon repair  . Eye surgery Bilateral     Cataract  . Insertion of dialysis catheter Right 06/30/2012    Procedure: INSERTION OF DIALYSIS CATHETER right IJ;  Surgeon: Larina Earthlyodd F Early, MD;  [facility] OR;  Service: Vascular;  Laterality: Right;    Family History  Problem Relation Age of Onset  .  Parkinson's disease Brother     Social History [name] reports that he has never smoked. He does not have any smokeless tobacco history on file. [name] reports that he drinks about 12.6 ounces of alcohol per week.  Review of Systems As outlined above. No cough or hemoptysis. No leg swelling. Sleeping somewhat better.  Physical Examination Filed Vitals:   05/20/13 1300  BP: 132/50  Pulse: 46   Filed Weights   05/20/13 1300  Weight: 180 lb (81.647 kg)    Elderly male, appears comfortable at rest.  HEENT: Conjunctiva and lids normal, oropharynx clear.  Neck: Supple, no elevated JVP or carotid bruits, no thyromegaly.  Lungs: Clear to auscultation, nonlabored breathing at rest.  Cardiac: Irregularly irregular, indistinct PMI, no S3, soft systolic murmur, no pericardial rub.  Abdomen: Soft, nontender, bowel sounds present, no guarding or rebound.  Extremities: Trace edema, distal pulses 1-2+.  Skin: Warm and dry.  Musculoskeletal: No kyphosis.  Neuropsychiatric: Alert and oriented x3, affect grossly appropriate.   Problem List and Plan   Pulmonary hypertension Severe by recent echocardiogram with PASP 90 mm mercury. He has grade 2 diastolic dysfunction, mild to moderate RV dilatation and dysfunction. No left-sided valvular lesions to explain this. No known history of chronic lung disease or thromboembolic disease. Recent sleep testing demonstrated only mild abnormalities and in fact CPAP was not recommended. I have recommended that the patient be evaluated by Dr. Gala Park and undergone right heart catheterization to better define his pulmonary pressures, and more specifically to help determine if there any treatments that might help reverse this or at least improve his quality of life. I discussed this with the patient and his daughter present, he is in agreement to proceed next week. Lab work will be obtained on the day of the procedure.  SSS (sick sinus syndrome) Patient with  recently documented junctional rhythm and also slow atrial fibrillation. GXT as noted above is consistent with chronotropic incompetence, and I suspect that this also plays a role in his symptoms. EP consultation may be pursued to at least discuss whether a pacemaker might lead to some improvement in his quality of life, although would further evaluate pulmonary hypertension first. I discussed this with the patient and his daughter today.  Atrial fibrillation Long-standing diagnosis based on review of tracings since 2013. He had a hemorrhagic stroke last year, and would not be a candidate for anticoagulation, in fact has refused in any event. He is not even on aspirin at this point.   Essential hypertension, benign Systemic blood pressure mildly elevated today.  CKD (chronic kidney disease) stage 5, GFR less than 15 ml/min Followed by Dr. Kristian CoveyBefekadu. Patient was on hemodialysis for 3 months, and was then able to stop. Reports relatively stable renal function. Recent potassium normal. Followup labs pending. Would not pursue coronary angiography due high risk for contrast nephropathy.    Jonelle SidleSamuel G. Ahmaad Neidhardt, M.D., F.A.C.C.

## 2013-05-23 ENCOUNTER — Telehealth: Payer: Self-pay | Admitting: *Deleted

## 2013-05-23 NOTE — Telephone Encounter (Signed)
Noted. Please make sure that he has a followup visit to discuss things further. We had a long discussion last week about his current condition and plan for further evaluation. His daughter was also in agreement with pursuing further workup. This ultimately is however his decision.

## 2013-05-23 NOTE — Telephone Encounter (Signed)
FYI: Pt called stating he did not want to get a Cath and wanted me to cancel this procedure. Pt would not tell me over the phone why he wanted to cancel, but wanted to make an appointment with you instead. Informed Tanya at front desk to make an appointment.  Please let me know if you want anything else done, before I cancel this procedure.

## 2013-05-24 ENCOUNTER — Encounter (HOSPITAL_COMMUNITY): Admission: RE | Payer: Self-pay | Source: Ambulatory Visit

## 2013-05-24 ENCOUNTER — Ambulatory Visit (HOSPITAL_COMMUNITY): Admission: RE | Admit: 2013-05-24 | Payer: Medicare Other | Source: Ambulatory Visit | Admitting: Internal Medicine

## 2013-05-24 SURGERY — RIGHT HEART CATH
Anesthesia: LOCAL

## 2013-05-26 ENCOUNTER — Encounter: Payer: Self-pay | Admitting: Cardiology

## 2013-05-27 ENCOUNTER — Ambulatory Visit (INDEPENDENT_AMBULATORY_CARE_PROVIDER_SITE_OTHER): Payer: Medicare Other | Admitting: Cardiology

## 2013-05-27 ENCOUNTER — Encounter: Payer: Self-pay | Admitting: Cardiology

## 2013-05-27 VITALS — BP 140/50 | HR 45 | Ht 69.0 in | Wt 180.0 lb

## 2013-05-27 DIAGNOSIS — I2789 Other specified pulmonary heart diseases: Secondary | ICD-10-CM

## 2013-05-27 DIAGNOSIS — I495 Sick sinus syndrome: Secondary | ICD-10-CM

## 2013-05-27 DIAGNOSIS — I4891 Unspecified atrial fibrillation: Secondary | ICD-10-CM

## 2013-05-27 DIAGNOSIS — I272 Pulmonary hypertension, unspecified: Secondary | ICD-10-CM

## 2013-05-27 DIAGNOSIS — N185 Chronic kidney disease, stage 5: Secondary | ICD-10-CM

## 2013-05-27 NOTE — Assessment & Plan Note (Signed)
Severe, and likely progressive based on review of available records. Patient declines further workup at this point, specifically canceled his right heart catheterization with Dr. Gala RomneyBensimhon. I have asked him to consider the matter. I could have him seen simply for consultation with Dr. Gala RomneyBensimhon if he would like. Otherwise uncovering etiology and potential treatment options will be quite limited.

## 2013-05-27 NOTE — Assessment & Plan Note (Signed)
Long-standing diagnosis based on review of tracings since 2013. He had a hemorrhagic stroke last year, and would not be a candidate for anticoagulation, in fact has refused in any event. He is not even on aspirin at this point.

## 2013-05-27 NOTE — Patient Instructions (Signed)
Your physician recommends that you see Dr.Greg Ladona Ridgelaylor for consultation on June 12 th at 9:45 am     Your physician recommends that you continue on your current medications as directed. Please refer to the Current Medication list given to you today.    Thank you for choosing [facility] !

## 2013-05-27 NOTE — Assessment & Plan Note (Signed)
Patient with PAF, junctional rhythm, and evidence of chronotropic incompetence based on GXT as detailed above. He has a fairly chronic sensation of progressive reduction in stamina and fatigue. As noted above, this is likely multifactorial, however contributed to by his rhythm as well I suspect. He has requested EP consultation to discuss whether a pacemaker might at all positively impact his quality of life.

## 2013-05-27 NOTE — Assessment & Plan Note (Addendum)
Followed by Dr. Kristian CoveyBefekadu. Patient was on hemodialysis for 3 months, and was then able to stop. Reports relatively stable renal function.

## 2013-05-27 NOTE — Progress Notes (Signed)
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 After an extensive discussion with the patient and his daughter, we decided that the next step would be a right heart catheterization by Dr. Gala RomneyBensimhon for evaluation of severe pulmonary hypertension, and then determine the next step from there.. After we scheduling the procedure, the patient later called back to cancel it, and requested a visit in the office for further discussion.  Additional [facility], cardiac testing was done by Dr. Lady GaryFath back in August 2014. At that time he was described as being in sinus rhythm with right bundle branch block. Patient exercised on treadmill, peak heart rate was only 100 beats per minute which was 71% MPHR, perfusion imaging showed LVEF of 40% with no ischemia (at suboptimal heart rate response). Echocardiogram done at the same facility at that time reported LVEF 50-55% with mild biatrial enlargement, mild LVH, trace tricuspid regurgitation, and what was described as moderate pulmonary hypertension, although the RVSP was calculated at 75 mm mercury.  He is here with his other daughter today to discuss the situation again. He seems very hesitant to consider a right heart catheterization having considered the matter. I reinforced the fact that without additional information it would be hard to know how to treat him or offer any reasonable possibility of improving the situation. I also explained that based on review of his testing from last year, this looked to be a progressive condition.  As regards his slow heart rate and probable sick sinus syndrome with chronotropic incompetence and PAF, I did discuss the fact I think this is a least partially a contributor to his feelings of decreased energy and reduced stamina. He asked me point blank if a pacemaker would  make him feel better, and I told him quite honestly that I was not sure if it would, but it would not be unreasonable to have an EP consultation to discuss this issue.  Recent GXT showed baseline rhythm was described as a junctional rhythm at 39 beats per minute, increased to only 72 beats per minute with exercise (52% MPHR). Systolic blood pressure increased from 140-160. He stopped due to fatigue and leg discomfort, no chest pain. Overall nondiagnostic study for ischemia, although consistent with chronotropic incompetence.    Allergies  Allergen Reactions  . Altace [Ramipril] Hives    Current Outpatient Prescriptions  Medication Sig Dispense Refill  . albuterol (PROVENTIL) (2.5 MG/3ML) 0.083% nebulizer solution Take 2.5 mg by nebulization every 6 (six) hours as needed for wheezing or shortness of breath.      . calcium acetate (PHOSLO) 667 MG capsule       . doxazosin (CARDURA) 1 MG tablet Take 1 mg by mouth daily.      . furosemide (LASIX) 20 MG tablet Take 20 mg by mouth as needed (1 tab daily as needed for wt gain over 3 lbs).      . losartan (COZAAR) 50 MG tablet Take 50 mg by mouth 2 (two) times daily.      . Melatonin 5 MG TABS Take 5 mg by mouth at bedtime as needed (Sleep).      . sodium bicarbonate 650 MG tablet Take 650 mg by mouth daily.       No current facility-administered medications for this visit.    Past Medical History  Diagnosis Date  .  Atrial fibrillation 02/2011    Not anticoagulated  . Right bundle branch block   . Essential hypertension, benign   . Anxiety   . Depression   . Constipation   . GERD (gastroesophageal reflux disease)   . Arthritis   . Urinary retention     Self-catheterization when necessary  . Rotator cuff tear     Right - not repaired  . Stroke 05/2012    Gait disturbance only residual - hemorrhagic stroke  . CKD (chronic kidney disease) stage 5, GFR less than 15 ml/min     Previously on hemodialysis  . Pulmonary hypertension      Severe  . Chronotropic incompetence     Suspected based on GXT    Past Surgical History  Procedure Laterality Date  . Knee surgery      Tendon repair  . Eye surgery Bilateral     Cataract  . Insertion of dialysis catheter Right 06/30/2012    Procedure: INSERTION OF DIALYSIS CATHETER right IJ;  Surgeon: Larina Earthlyodd F Early, MD;  [facility];  Service: Vascular;  Laterality: Right;    Family History  Problem Relation Age of Onset  . Parkinson's disease Brother     Social History James Park reports that he has never smoked. He does not have any smokeless tobacco history on file. James Park reports that he drinks about 12.6 ounces of alcohol per week.  Review of Systems No change since recent visit.  Physical Examination Filed Vitals:   05/27/13 1605  BP: 140/50  Pulse: 45   Filed Weights   05/27/13 1605  Weight: 180 lb (81.647 kg)    Elderly male, appears comfortable at rest.  HEENT: Conjunctiva and lids normal, oropharynx clear.  Neck: Supple, no elevated JVP or carotid bruits, no thyromegaly.  Lungs: Clear to auscultation, nonlabored breathing at rest.  Cardiac: Irregularly irregular, indistinct PMI, no S3, soft systolic murmur, no pericardial rub.  Abdomen: Soft, nontender, bowel sounds present, no guarding or rebound.  Extremities: Trace edema, distal pulses 1-2+.    Problem List and Plan   SSS (sick sinus syndrome) Patient with PAF, junctional rhythm, and evidence of chronotropic incompetence based on GXT as detailed above. He has a fairly chronic sensation of progressive reduction in stamina and fatigue. As noted above, this is likely multifactorial, however contributed to by his rhythm as well I suspect. He has requested EP consultation to discuss whether a pacemaker might at all positively impact his quality of life.  Atrial fibrillation Long-standing diagnosis based on review of tracings since 2013. He had a hemorrhagic stroke last year, and would not be a  candidate for anticoagulation, in fact has refused in any event. He is not even on aspirin at this point.  Pulmonary hypertension Severe, and likely progressive based on review of available records. Patient declines further workup at this point, specifically canceled his right heart catheterization with Dr. Gala RomneyBensimhon. I have asked him to consider the matter. I could have him seen simply for consultation with Dr. Gala RomneyBensimhon if he would like. Otherwise uncovering etiology and potential treatment options will be quite limited.  CKD (chronic kidney disease) stage 5, GFR less than 15 ml/min Followed by Dr. Kristian CoveyBefekadu. Patient was on hemodialysis for 3 months, and was then able to stop. Reports relatively stable renal function.    Jonelle SidleSamuel G. Joziah Dollins, M.D., F.A.C.C.

## 2013-06-07 ENCOUNTER — Encounter: Payer: Self-pay | Admitting: Cardiology

## 2013-06-21 ENCOUNTER — Ambulatory Visit: Payer: Medicare Other | Admitting: Cardiology

## 2013-06-23 ENCOUNTER — Ambulatory Visit (INDEPENDENT_AMBULATORY_CARE_PROVIDER_SITE_OTHER): Payer: Medicare Other | Admitting: Otolaryngology

## 2013-06-23 DIAGNOSIS — J310 Chronic rhinitis: Secondary | ICD-10-CM

## 2013-06-23 DIAGNOSIS — R490 Dysphonia: Secondary | ICD-10-CM

## 2013-06-24 ENCOUNTER — Encounter: Payer: Self-pay | Admitting: Internal Medicine

## 2013-06-24 ENCOUNTER — Ambulatory Visit (INDEPENDENT_AMBULATORY_CARE_PROVIDER_SITE_OTHER): Payer: Medicare Other | Admitting: Internal Medicine

## 2013-06-24 VITALS — BP 160/64 | HR 63 | Ht 69.0 in | Wt 173.0 lb

## 2013-06-24 DIAGNOSIS — I4891 Unspecified atrial fibrillation: Secondary | ICD-10-CM

## 2013-06-24 DIAGNOSIS — I2789 Other specified pulmonary heart diseases: Secondary | ICD-10-CM

## 2013-06-24 DIAGNOSIS — I272 Pulmonary hypertension, unspecified: Secondary | ICD-10-CM

## 2013-06-24 NOTE — Patient Instructions (Signed)
Your physician recommends that you schedule a follow-up appointment in: To Be determined  

## 2013-06-28 ENCOUNTER — Encounter: Payer: Self-pay | Admitting: Internal Medicine

## 2013-06-28 NOTE — Assessment & Plan Note (Signed)
We discussed the treatment options. I have recommended right heart cath along with the possibility of vasodilator therapy. This has been discussed with him previously. He is considering his options.

## 2013-06-28 NOTE — Progress Notes (Signed)
HPI [name] is referred today for consideration for PPM insertion. He is an 80 yo man with chronotropic incompetence, pulmonary HTN, and RBBB. He has felt bad for 3-4 years. He also has severe renal insufficiency and underwent insertion of a dialysis catheter. He has decided not to undergo HD having done it for a few months. He still makes urine. He has been recommended to have a right heart cath to understand the severity of his pulmonary HTN. He has mild peripheral edema. His blood pressure remains elevated but has been unable to take many anti-HTN meds because of bradycardia. Allergies  Allergen Reactions  . Altace [Ramipril] Hives     Current Outpatient Prescriptions  Medication Sig Dispense Refill  . albuterol (PROVENTIL) (2.5 MG/3ML) 0.083% nebulizer solution Take 2.5 mg by nebulization every 6 (six) hours as needed for wheezing or shortness of breath.      . calcium acetate (PHOSLO) 667 MG capsule       . doxazosin (CARDURA) 1 MG tablet Take 1 mg by mouth daily.      . furosemide (LASIX) 20 MG tablet Take 20 mg by mouth as needed (1 tab daily as needed for wt gain over 3 lbs).      . losartan (COZAAR) 50 MG tablet Take 50 mg by mouth 2 (two) times daily.      . Melatonin 5 MG TABS Take 5 mg by mouth at bedtime as needed (Sleep).      . sodium bicarbonate 650 MG tablet Take 650 mg by mouth daily.       No current facility-administered medications for this visit.     Past Medical History  Diagnosis Date  . Atrial fibrillation 02/2011    Not anticoagulated  . Right bundle branch block   . Essential hypertension, benign   . Anxiety   . Depression   . Constipation   . GERD (gastroesophageal reflux disease)   . Arthritis   . Urinary retention     Self-catheterization when necessary  . Rotator cuff tear     Right - not repaired  . Stroke 05/2012    Gait disturbance only residual - hemorrhagic stroke  . CKD (chronic kidney disease) stage 5, GFR less than 15 ml/min    Previously on hemodialysis  . Pulmonary hypertension     Severe  . Chronotropic incompetence     Suspected based on GXT    ROS:   All systems reviewed and negative except as noted in the HPI.   Past Surgical History  Procedure Laterality Date  . Knee surgery      Tendon repair  . Eye surgery Bilateral     Cataract  . Insertion of dialysis catheter Right 06/30/2012    Procedure: INSERTION OF DIALYSIS CATHETER right IJ;  Surgeon: Larina Earthlyodd F Early, MD;  [facility];  Service: Vascular;  Laterality: Right;     Family History  Problem Relation Age of Onset  . Parkinson's disease Brother      History   Social History  . Marital Status: Divorced    Spouse Name: N/A    Number of Children: N/A  . Years of Education: N/A   Occupational History  . Not on file.   Social History Main Topics  . Smoking status: Never Smoker   . Smokeless tobacco: Not on file     Comment: quit 1967  . Alcohol Use: 12.6 oz/week    21 Cans of beer per week  Comment: 3 in past 3 weeks  . Drug Use: No  . Sexual Activity: Not on file   Other Topics Concern  . Not on file   Social History Narrative  . No narrative on file     BP 160/64  Pulse 63  Ht 5\' 9"  (1.753 m)  Wt 173 lb (78.472 kg)  BMI 25.54 kg/m2  Physical Exam:  Chronically ill appearing elderly man, NAD HEENT: Unremarkable Neck:  No JVD, no thyromegally Back:  No CVA tenderness Lungs:  Clear HEART:  Regular rate rhythm, 2/6 systolic murmur, no rubs, no clicks Abd:  soft, positive bowel sounds, no organomegally, no rebound, no guarding Ext:  2 plus pulses, no edema, no cyanosis, no clubbing Skin:  No rashes no nodules Neuro:  CN II through XII intact, motor grossly intact   Assess/Plan:

## 2013-06-28 NOTE — Assessment & Plan Note (Signed)
His ventricular rates are slow. He has chronic atrial fib. He is not an anti-coagulation candidate. We discussed PPM insertion. He is not on coumadin.

## 2013-10-21 ENCOUNTER — Emergency Department (HOSPITAL_COMMUNITY): Payer: Medicare Other

## 2013-10-21 ENCOUNTER — Encounter (HOSPITAL_COMMUNITY): Payer: Self-pay | Admitting: Emergency Medicine

## 2013-10-21 ENCOUNTER — Emergency Department (HOSPITAL_COMMUNITY)
Admission: EM | Admit: 2013-10-21 | Discharge: 2013-10-21 | Disposition: A | Discharge status: Home / Self Care | Payer: Medicare Other | Attending: Emergency Medicine | Admitting: Emergency Medicine

## 2013-10-21 DIAGNOSIS — Z8659 Personal history of other mental and behavioral disorders: Secondary | ICD-10-CM | POA: Diagnosis not present

## 2013-10-21 DIAGNOSIS — K5732 Diverticulitis of large intestine without perforation or abscess without bleeding: Secondary | ICD-10-CM | POA: Diagnosis not present

## 2013-10-21 DIAGNOSIS — I120 Hypertensive chronic kidney disease with stage 5 chronic kidney disease or end stage renal disease: Secondary | ICD-10-CM | POA: Diagnosis not present

## 2013-10-21 DIAGNOSIS — Z87828 Personal history of other (healed) physical injury and trauma: Secondary | ICD-10-CM | POA: Diagnosis not present

## 2013-10-21 DIAGNOSIS — N185 Chronic kidney disease, stage 5: Secondary | ICD-10-CM | POA: Diagnosis not present

## 2013-10-21 DIAGNOSIS — K5900 Constipation, unspecified: Secondary | ICD-10-CM | POA: Diagnosis present

## 2013-10-21 DIAGNOSIS — Z7951 Long term (current) use of inhaled steroids: Secondary | ICD-10-CM | POA: Insufficient documentation

## 2013-10-21 DIAGNOSIS — N390 Urinary tract infection, site not specified: Secondary | ICD-10-CM

## 2013-10-21 DIAGNOSIS — M1990 Unspecified osteoarthritis, unspecified site: Secondary | ICD-10-CM | POA: Insufficient documentation

## 2013-10-21 DIAGNOSIS — Z79899 Other long term (current) drug therapy: Secondary | ICD-10-CM | POA: Insufficient documentation

## 2013-10-21 DIAGNOSIS — Z8673 Personal history of transient ischemic attack (TIA), and cerebral infarction without residual deficits: Secondary | ICD-10-CM | POA: Insufficient documentation

## 2013-10-21 LAB — URINALYSIS, ROUTINE W REFLEX MICROSCOPIC
Bilirubin Urine: NEGATIVE
Glucose, UA: 250 mg/dL — AB
KETONES UR: NEGATIVE mg/dL
Nitrite: NEGATIVE
Specific Gravity, Urine: 1.01 (ref 1.005–1.030)
UROBILINOGEN UA: 0.2 mg/dL (ref 0.0–1.0)
pH: 5.5 (ref 5.0–8.0)

## 2013-10-21 LAB — COMPREHENSIVE METABOLIC PANEL
ALT: 9 U/L (ref 0–53)
AST: 14 U/L (ref 0–37)
Albumin: 3.4 g/dL — ABNORMAL LOW (ref 3.5–5.2)
Alkaline Phosphatase: 131 U/L — ABNORMAL HIGH (ref 39–117)
Anion gap: 14 (ref 5–15)
BUN: 88 mg/dL — ABNORMAL HIGH (ref 6–23)
CO2: 19 mEq/L (ref 19–32)
Calcium: 8.4 mg/dL (ref 8.4–10.5)
Chloride: 102 mEq/L (ref 96–112)
Creatinine, Ser: 4.78 mg/dL — ABNORMAL HIGH (ref 0.50–1.35)
GFR calc Af Amer: 12 mL/min — ABNORMAL LOW (ref >90)
GFR calc non Af Amer: 10 mL/min — ABNORMAL LOW (ref >90)
Glucose, Bld: 123 mg/dL — ABNORMAL HIGH (ref 70–99)
POTASSIUM: 5 meq/L (ref 3.7–5.3)
SODIUM: 135 meq/L — AB (ref 137–147)
TOTAL PROTEIN: 6.8 g/dL (ref 6.0–8.3)
Total Bilirubin: 0.5 mg/dL (ref 0.3–1.2)

## 2013-10-21 LAB — CBC WITH DIFFERENTIAL/PLATELET
Basophils Absolute: 0 10*3/uL (ref 0.0–0.1)
Basophils Relative: 0 % (ref 0–1)
EOS ABS: 0.3 10*3/uL (ref 0.0–0.7)
Eosinophils Relative: 5 % (ref 0–5)
HCT: 34.2 % — ABNORMAL LOW (ref 39.0–52.0)
Hemoglobin: 11.4 g/dL — ABNORMAL LOW (ref 13.0–17.0)
LYMPHS ABS: 0.5 10*3/uL — AB (ref 0.7–4.0)
Lymphocytes Relative: 8 % — ABNORMAL LOW (ref 12–46)
MCH: 29.6 pg (ref 26.0–34.0)
MCHC: 33.3 g/dL (ref 30.0–36.0)
MCV: 88.8 fL (ref 78.0–100.0)
Monocytes Absolute: 0.6 10*3/uL (ref 0.1–1.0)
Monocytes Relative: 11 % (ref 3–12)
NEUTROS ABS: 4.3 10*3/uL (ref 1.7–7.7)
NEUTROS PCT: 75 % (ref 43–77)
PLATELETS: 110 10*3/uL — AB (ref 150–400)
RBC: 3.85 MIL/uL — AB (ref 4.22–5.81)
RDW: 14.2 % (ref 11.5–15.5)
WBC: 5.7 10*3/uL (ref 4.0–10.5)

## 2013-10-21 LAB — URINE MICROSCOPIC-ADD ON

## 2013-10-21 LAB — LIPASE, BLOOD: Lipase: 35 U/L (ref 11–59)

## 2013-10-21 MED ORDER — AMOXICILLIN-POT CLAVULANATE 500-125 MG PO TABS
1.0000 | ORAL_TABLET | Freq: Once | ORAL | Status: AC
  Administered 2013-10-21: 500 mg via ORAL
  Filled 2013-10-21: qty 1

## 2013-10-21 MED ORDER — AMOXICILLIN-POT CLAVULANATE 500-125 MG PO TABS: 1.0000 | ORAL_TABLET | Freq: Two times a day (BID) | ORAL | Status: DC

## 2013-10-21 MED ORDER — IOHEXOL 300 MG/ML  SOLN
50.0000 mL | Freq: Once | INTRAMUSCULAR | Status: AC | PRN
  Administered 2013-10-21: 50 mL via ORAL

## 2013-10-21 NOTE — Discharge Instructions (Signed)
°Emergency Department Resource Guide °1) Find a Doctor and Pay Out of Pocket °Although you won't have to find out who is covered by your insurance plan, it is a good idea to ask around and get recommendations. You will then need to call the office and see if the doctor you have chosen will accept you as a new patient and what types of options they offer for patients who are self-pay. Some doctors offer discounts or will set up payment plans for their patients who do not have insurance, but you will need to ask so you aren't surprised when you get to your appointment. ° °2) Contact Your Local Health Department °Not all health departments have doctors that can see patients for sick visits, but many do, so it is worth a call to see if yours does. If you don't know where your local health department is, you can check in your phone book. The CDC also has a tool to help you locate your state's health department, and many state websites also have listings of all of their local health departments. ° °3) Find a Walk-in Clinic °If your illness is not likely to be very severe or complicated, you may want to try a walk in clinic. These are popping up all over the country in pharmacies, drugstores, and shopping centers. They're usually staffed by nurse practitioners or physician assistants that have been trained to treat common illnesses and complaints. They're usually fairly quick and inexpensive. However, if you have serious medical issues or chronic medical problems, these are probably not your best option. ° °No Primary Care Doctor: °- Call [facility] at  832-8000 - they can help you locate a primary care doctor that  accepts your insurance, provides certain services, etc. °- Physician Referral Service- 1-800-533-3463 ° °Chronic Pain Problems: °Organization         Address  Phone   Notes  °[facility]  (336) 297-2271 Patients need to be referred by their primary care doctor.  ° °Medication  Assistance: °Organization         Address  Phone   Notes  °[place] [facility] [address] °[place], [place] [address] (336) 641-8030 --Must be a resident of [place] °-- Must have NO insurance coverage whatsoever (no Medicaid/ Medicare, etc.) °-- The pt. MUST have a primary care doctor that directs their care regularly and follows them in the community °  °[facility]  (866) 331-1348   °[employer]  (888) 892-1162   ° °Agencies that provide inexpensive medical care: °Organization         Address  Phone   Notes  °[facility]  (336) 832-8035   °[facility]    (336) 832-7272   °[facility] [address] °[place], [place] [address] (336) 832-4777   °[facility] [address], °[place] (336) 271-4999   °[facility]    (336) 373-0678   °[facility]    (336) 272-1050   °[facility] ° [address], [place] Phone:  (336) 832-4444, Fax:  (336) 832-4440 Hours of Operation:  9 am - 6 pm, M-F.  Also accepts Medicaid/Medicare and self-pay.  °[facility] ° [address], [place] Phone: (336) 832-3150, Fax: (336) [phone]. Hours of Operation:  8:30 am - 5:30 pm, M-F.  Also accepts Medicaid and self-pay.  °[facility] [place] [address], [place] Phone: (336) 878-6027   °[facility] [address], [place], [place] (336)723-1848, Ext. 123 Mondays & Thursdays: 7-9 AM.  First 15 patients are seen on a first come, first serve basis. °  ° °Medicaid-accepting [place] Providers: ° °Organization         Address  Phone   Notes  °[facility] [address], [place] (336) 641-2100 Also accepts self-pay patients.  °[facility] [address], [place] ° (336) 856-9996   °[facility] [address], [place]  (336) 288-8857   °[facility] [address] [place] [address], [place] (336) 299-7000   °Veita Bland [address], [place]  ° (336) 373-1557 Only accepts [place] Access Medicaid patients after they have their name applied to their card.  ° °Self-Pay (no insurance) in [place]: ° °Organization         Address  Phone   Notes  °Sickle Cell Patients, [facility] [address], [place] (336) 832-1970   °[facility] [address], [place] (336) 832-4400   °[facility] ° [address], [place] (336) 992-4800   °[facility]/Dr. Osei-Bonsu ° [address] [place] [address], [place] or [address], [place] (336) 841-8500 Phone number for both [place] and [place] locations is the same.  °[facility] [address], [place] (336) 299-0000   °[facility] [address] [place] [address], [place] or [address] (336) 852-7530 °(336) 878-2260   °[facility] [address], [place] (336) 350-1642, phone; (336) 294-5005, fax Sees patients 1st and 3rd Saturday of every month.  Must not qualify for public or private insurance (i.e. Medicaid, Medicare, [place] Health Choice, Veterans' Benefits) • Household income should be no more than 200% of the poverty level •The clinic cannot treat you if you are pregnant or think you are pregnant • Sexually transmitted diseases are not treated at the clinic.  ° ° °Dental Care: °Organization         Address  Phone  Notes  °[place] [facility] [address], [place] (336) 641-6152 Accepts children up to age 21 who are enrolled in Medicaid or [place] Health Choice; pregnant women with a Medicaid card; and children who have applied for Medicaid or [place] Health Choice, but were declined, whose parents can pay a reduced fee at time of service.  °[place]  [facility] [place]  [address], [place] (336) 641-7733 Accepts children up to age 21 who are enrolled in Medicaid or [place] Health Choice; pregnant women with a Medicaid card; and children who have applied for Medicaid or [place] Health Choice, but were declined, whose parents can pay a reduced fee at time of service.  °[facility] ° [address], [place] (336) 641-4533 Patients are seen by appointment only. Walk-ins are not accepted. [facility] will see patients 18 years of age and older. °Monday - Tuesday (8am-5pm) °Most Wednesdays (8:30-5pm) °$30 per visit, cash only  °[facility] ° [address], [place] (336) 641-4533 Patients are seen by appointment only. Walk-ins are not accepted. [facility] will see patients 18 years of age and older. °One   Wednesday Evening (Monthly: Volunteer Based).  $30 per visit, cash only  °[facility]  (919) 537-3737 for adults; Children under age 4, call Graduate Pediatric Dentistry at ([phone]) [phone]. Children aged 4-14, please call ([phone]) [phone] to request a pediatric application. ° Dental services are provided in all areas of dental care including fillings, crowns and bridges, complete and partial dentures, implants, gum treatment, root canals, and extractions. Preventive care is also provided. Treatment is provided to both adults and children. °Patients are selected via a lottery and there is often a waiting list. °  °[facility] [address], °[place] ° (336) 763-8833 [email or web address] °  °[facility] [address], [place], [place] (336)723-1848, Ext. 123 Second and Fourth Thursday of each month, opens at 6:30 AM; Clinic ends at 9 AM.  Patients are seen on a first-come first-served basis, and a limited number are seen during each clinic.  ° °[facility] ° [address], [place], [place] (336) 723-7904    Eligibility Requirements °You must have lived in [place], [place], or [place] counties for at least the last three months. °  You cannot be eligible for state or federal sponsored healthcare insurance, including Veterans Administration, Medicaid, or Medicare. °  You generally cannot be eligible for healthcare insurance through your employer.  °  How to apply: °Eligibility screenings are held every Tuesday and Wednesday afternoon from 1:00 pm until 4:00 pm. You do not need an appointment for the interview!  °[facility] [address], [place], [place] 336-631-2330   °[facility]  336-342-8273   °[facility]  336-703-3100   °[facility]  336-570-6415   ° °Behavioral Health Resources in the Community: °Intensive Outpatient Programs °Organization         Address  Phone  Notes  °[facility] [address], [place], [place] 336-878-6098   °[facility] [address], [place], [place] 336-832-9800   °[facility] [address], [place], [place] ° 336-882-2125   °[facility] [address],  °[place], [place] 1-800-853-5163 or 336-641-4981   °Substance Abuse Resources °Organization         Address  Phone  Notes  °[facility]  336-882-2125   °[facility]  336-784-9470   °[facility]  336-285-9073   °[facility]  336-845-3988   °Residential & Outpatient Substance Abuse Program  1-800-659-3381   °Psychological Services °Organization         Address  Phone  Notes  °[facility]  336- 832-9600   °[facility]  336- 378-7881   °[facility] [address], [place] 1-800-853-5163 or 336-641-4981   ° °Mobile Crisis Teams °Organization         Address  Phone  Notes  °[facility], Mobile Crisis Care Unit  1-877-626-1772   °Assertive °[facility] ° [address]  [place], [place] 336-834-9664   °Sharon DeEsch [address] °[place] 336-554-5454   ° °Self-Help/Support Groups °Organization         Address  Phone             Notes  °[facility]  - variety of support groups  336- 373-1402 Call for more information  °Narcotics Anonymous (NA), [facility] [address], °[place]  2 meetings at this location  ° °  Residential Treatment Programs Organization         Address  Phone  Notes  [facility] [address],    [place]  [phone]   [facility]  [address], [place] [phone], [place], [place] [phone]   [facility] [address], [place] [phone] Admissions: 8am-3pm M-F  [facility] [address]    [place], [place] [phone]   [facility] [address], [place], [place] [phone]   [facility] [address]  [place], [place] [phone]   [facility] Intensive Outpatient [address], [place], [place] [phone](212)[phone]   [facility] [address]  [place], [place] [phone] or [phone]   [facility] [address] [place], [place] [phone] Accepts Medicaid  [facility] [address]  [place] [phone] Substance Abuse/Addiction Treatment   [facility]         Address  Phone  Notes  [facility]  [phone](888) [phone]   Angie FavaJulie Brannon, PhD [address], [address] [place], [place]   [phone](336) [phone] or [phone](336) [phone]   [facility]   [address] [place], [place] [phone](336) [phone]   [facility] [address], [place], [place] [phone](336) ([phone] Insurance/Medicaid/sponsorship through [facility] [address]                                    [place], [place] [phone](336) [phone] Therapy/tele-psych/case    [facility] [address]   [place], [place] [phone]) [phone]    Dr. Lolly MustacheArfeen  [phone](336) [phone]   [facility]  [employer] [facility] 1) [address], [place] 2) [address], [place] 3)  [address], [place] [phone](336) ([phone](336) ([phone]  (602)[phone](336) [phone]   [facility] Child [facility] (530)105-1589(336) 343-146-5902 or 763-280-8790(336) (561)[phone] (After Hours)      Take the prescriptions as directed. Consume a liquid diet for the next 2 to 3 days, then slowly advance your usual diet as tolerated. Increase the fiber in your diet.  Call your regular medical doctor on Monday to schedule a follow up appointment within the next 3 days.  Return to the Emergency Department immediately sooner if worsening.

## 2013-10-21 NOTE — ED Provider Notes (Signed)
CSN: [id number]     Arrival date & time 10/21/13  1440 History   First MD Initiated Contact with Patient 10/21/13 1629     Chief Complaint  Patient presents with  . Constipation      HPI Pt was seen at 1650.  Per pt, c/o gradual onset and persistence of constant generalized abd "pain" for the past 1 week, worse over the past 3 to 4 days.  Has been associated with constipation. Describes his stools as "small," "hard," and "occasionally watery." States he has taken OTC laxatives (PO and PR) as well as enemas without relief of his symptoms. Denies N/V, no fevers, no back pain, no rash, no CP/SOB, no black or blood in stools.       Past Medical History  Diagnosis Date  . Atrial fibrillation 02/2011    Not anticoagulated  . Right bundle branch block   . Essential hypertension, benign   . Anxiety   . Depression   . Constipation   . GERD (gastroesophageal reflux disease)   . Arthritis   . Urinary retention     Self-catheterization when necessary  . Rotator cuff tear     Right - not repaired  . Stroke 05/2012    Gait disturbance only residual - hemorrhagic stroke  . CKD (chronic kidney disease) stage 5, GFR less than 15 ml/min     Previously on hemodialysis  . Pulmonary hypertension     Severe  . Chronotropic incompetence     Suspected based on GXT   Past Surgical History  Procedure Laterality Date  . Knee surgery      Tendon repair  . Eye surgery Bilateral     Cataract  . Insertion of dialysis catheter Right 06/30/2012    Procedure: INSERTION OF DIALYSIS CATHETER right IJ;  Surgeon: Larina Earthly, MD;  Location: [facility] OR;  Service: Vascular;  Laterality: Right;   Family History  Problem Relation Age of Onset  . Parkinson's disease Brother    History  Substance Use Topics  . Smoking status: Never Smoker   . Smokeless tobacco: Not on file     Comment: quit 1967  . Alcohol Use: 12.6 oz/week    21 Cans of beer per week     Comment: 3 in past 3 weeks    Review of  Systems ROS: Statement: All systems negative except as marked or noted in the HPI; Constitutional: Negative for fever and chills. ; ; Eyes: Negative for eye pain, redness and discharge. ; ; ENMT: Negative for ear pain, hoarseness, nasal congestion, sinus pressure and sore throat. ; ; Cardiovascular: Negative for chest pain, palpitations, diaphoresis, dyspnea and peripheral edema. ; ; Respiratory: Negative for cough, wheezing and stridor. ; ; Gastrointestinal: +abd pain, constipation. Negative for nausea, vomiting, diarrhea, blood in stool, hematemesis, jaundice and rectal bleeding. . ; ; Genitourinary: Negative for dysuria, flank pain and hematuria. ; ; Musculoskeletal: Negative for back pain and neck pain. Negative for swelling and trauma.; ; Skin: Negative for pruritus, rash, abrasions, blisters, bruising and skin lesion.; ; Neuro: Negative for headache, lightheadedness and neck stiffness. Negative for weakness, altered level of consciousness , altered mental status, extremity weakness, paresthesias, involuntary movement, seizure and syncope.     Allergies  Altace  Home Medications   Prior to Admission medications   Medication Sig Start Date End Date Taking? Authorizing Provider  calcium acetate (PHOSLO) 667 MG capsule Take 667 mg by mouth 3 (three) times daily with meals.  05/23/13  Yes Historical Provider, MD  doxazosin (CARDURA) 1 MG tablet Take 1 mg by mouth daily.   Yes Historical Provider, MD  fluticasone (FLONASE) 50 MCG/ACT nasal spray Place 1 spray into both nostrils daily as needed for allergies or rhinitis.   Yes Historical Provider, MD  furosemide (LASIX) 20 MG tablet Take 20 mg by mouth as needed (1 tab daily as needed for wt gain over 3 lbs).   Yes Historical Provider, MD  losartan (COZAAR) 50 MG tablet Take 50 mg by mouth 2 (two) times daily. 06/13/12  Yes Milana ObeyStephen D Knowlton, MD  Melatonin 5 MG TABS Take 5 mg by mouth at bedtime as needed (Sleep).   Yes Historical Provider, MD  sodium  bicarbonate 650 MG tablet Take 650 mg by mouth daily.   Yes Historical Provider, MD  albuterol (PROVENTIL) (2.5 MG/3ML) 0.083% nebulizer solution Take 2.5 mg by nebulization every 6 (six) hours as needed for wheezing or shortness of breath.    Historical Provider, MD   BP 161/63  Pulse 45  Temp(Src) 98.4 F (36.9 C) (Oral)  Resp 18  Ht 5\' 10"  (1.778 m)  Wt 180 lb (81.647 kg)  BMI 25.83 kg/m2  SpO2 97% Physical Exam 1655: Physical examination:  Nursing notes reviewed; Vital signs and O2 SAT reviewed;  Constitutional: Well developed, Well nourished, Well hydrated, In no acute distress; Head:  Normocephalic, atraumatic; Eyes: EOMI, PERRL, No scleral icterus; ENMT: Mouth and pharynx normal, Mucous membranes moist; Neck: Supple, Full range of motion, No lymphadenopathy; Cardiovascular: Regular rate and rhythm, No gallop; Respiratory: Breath sounds clear & equal bilaterally, No wheezes.  Speaking full sentences with ease, Normal respiratory effort/excursion; Chest: Nontender, Movement normal; Abdomen: Soft, +mild diffuse tenderness. No rebound or guarding. +mildly distended, Normal bowel sounds; Genitourinary: No CVA tenderness; Extremities: Pulses normal, No tenderness, No edema, No calf edema or asymmetry.; Neuro: AA&Ox3, Major CN grossly intact.  Speech clear. No gross focal motor or sensory deficits in extremities.; Skin: Color normal, Warm, Dry.   ED Course  Procedures     EKG Interpretation None      MDM  MDM Reviewed: previous chart, nursing note and vitals Reviewed previous: labs Interpretation: labs, x-ray and CT scan     Results for orders placed during the hospital encounter of 10/21/13  CBC WITH DIFFERENTIAL      Result Value Ref Range   WBC 5.7  4.0 - 10.5 K/uL   RBC 3.85 (*) 4.22 - 5.81 MIL/uL   Hemoglobin 11.4 (*) 13.0 - 17.0 g/dL   HCT 16.134.2 (*) 09.639.0 - 04.552.0 %   MCV 88.8  78.0 - 100.0 fL   MCH 29.6  26.0 - 34.0 pg   MCHC 33.3  30.0 - 36.0 g/dL   RDW 40.914.2  81.111.5 -  91.415.5 %   Platelets 110 (*) 150 - 400 K/uL   Neutrophils Relative % 75  43 - 77 %   Neutro Abs 4.3  1.7 - 7.7 K/uL   Lymphocytes Relative 8 (*) 12 - 46 %   Lymphs Abs 0.5 (*) 0.7 - 4.0 K/uL   Monocytes Relative 11  3 - 12 %   Monocytes Absolute 0.6  0.1 - 1.0 K/uL   Eosinophils Relative 5  0 - 5 %   Eosinophils Absolute 0.3  0.0 - 0.7 K/uL   Basophils Relative 0  0 - 1 %   Basophils Absolute 0.0  0.0 - 0.1 K/uL  COMPREHENSIVE METABOLIC PANEL      Result Value  Ref Range   Sodium 135 (*) 137 - 147 mEq/L   Potassium 5.0  3.7 - 5.3 mEq/L   Chloride 102  96 - 112 mEq/L   CO2 19  19 - 32 mEq/L   Glucose, Bld 123 (*) 70 - 99 mg/dL   BUN 88 (*) 6 - 23 mg/dL   Creatinine, Ser 4.09 (*) 0.50 - 1.35 mg/dL   Calcium 8.4  8.4 - 81.1 mg/dL   Total Protein 6.8  6.0 - 8.3 g/dL   Albumin 3.4 (*) 3.5 - 5.2 g/dL   AST 14  0 - 37 U/L   ALT 9  0 - 53 U/L   Alkaline Phosphatase 131 (*) 39 - 117 U/L   Total Bilirubin 0.5  0.3 - 1.2 mg/dL   GFR calc non Af Amer 10 (*) >90 mL/min   GFR calc Af Amer 12 (*) >90 mL/min   Anion gap 14  5 - 15  LIPASE, BLOOD      Result Value Ref Range   Lipase 35  11 - 59 U/L  URINALYSIS, ROUTINE W REFLEX MICROSCOPIC      Result Value Ref Range   Color, Urine YELLOW  YELLOW   APPearance HAZY (*) CLEAR   Specific Gravity, Urine 1.010  1.005 - 1.030   pH 5.5  5.0 - 8.0   Glucose, UA 250 (*) NEGATIVE mg/dL   Hgb urine dipstick MODERATE (*) NEGATIVE   Bilirubin Urine NEGATIVE  NEGATIVE   Ketones, ur NEGATIVE  NEGATIVE mg/dL   Protein, ur TRACE (*) NEGATIVE mg/dL   Urobilinogen, UA 0.2  0.0 - 1.0 mg/dL   Nitrite NEGATIVE  NEGATIVE   Leukocytes, UA LARGE (*) NEGATIVE  URINE MICROSCOPIC-ADD ON      Result Value Ref Range   WBC, UA TOO NUMEROUS TO COUNT  <3 WBC/hpf   RBC / HPF 3-6  <3 RBC/hpf   Bacteria, UA MANY (*) RARE   Ct Abdomen Pelvis Wo Contrast 10/21/2013   CLINICAL DATA:  Constipation  EXAM: CT ABDOMEN AND PELVIS WITHOUT CONTRAST  TECHNIQUE: Multidetector CT  imaging of the abdomen and pelvis was performed following the standard protocol without IV contrast.  COMPARISON:  None.  FINDINGS: Sagittal images shows degenerative changes thoracolumbar spine. There is moderate compression fracture lower endplate of L3 vertebral body of indeterminate age. Clinical correlation is necessary. No paraspinal hematoma to suggest and acute fracture.  The study is limited without IV contrast. Small hiatal hernia is noted. There is cardiomegaly. Atherosclerotic calcifications of coronary arteries. Mitral valve calcifications are noted.  Lung bases are unremarkable. Unenhanced liver shows no biliary ductal dilatation. Trace perihepatic ascites. No calcified gallstones are noted within gallbladder. Extensive atherosclerotic calcifications are noted abdominal aorta, bilateral renal artery, SMA and iliac arteries. There is aneurysmal dilatation of abdominal aorta measures up to 3.2 cm in diameter.  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 Aneurysmal dilatation of left common iliac artery measures 1.5 cm in diameter.  Mild acutely atrophic right kidney with cortical thinning. Atherosclerotic vascular calcifications are noted bilateral kidney. There is some cortical thinning due to atrophy in left kidney. No hydronephrosis.  Oral contrast material was given to the patient. No small bowel obstruction. No pericecal inflammation. The terminal ileum is unremarkable. Scattered diverticula are noted in descending colon. Multiple sigmoid colon diverticula are noted.  In axial image 62 there is mild stranding of pericolonic fat in left pelvis just above the urinary bladder. Findings  are highly suspicious for mild diverticulitis. There is no evidence of abscess. No extraluminal contrast material is noted. There is thickening of urinary bladder wall. This may be due to chronic inflammation or cystitis. Small amount of free fluid is noted in posterior  pelvis. Prostate gland and seminal vesicles are unremarkable. There is a left inguinal scrotal canal hernia containing short segment of descending colon without evidence of acute colonic obstruction. Fat is noted in the inferior aspect of the hernia. No destructive bony lesions are noted within pelvis.  IMPRESSION: 1. There is moderate compression fracture lower endplate of L3 vertebral body probable chronic in nature. Clinical correlation is necessary. Degenerative changes thoracolumbar spine. 2. Small hiatal hernia. 3. Descending colon diverticula are noted. Sigmoid colon diverticula are noted. There is mild stranding of pericolonic fat in left pelvis proximal sigmoid colon. Findings are suspicious for mild diverticulitis. No diverticular abscess is noted. No extraluminal contrast material is noted. 4. Mild thickening of urinary bladder wall. This may be due to chronic inflammation or cystitis. 5. There is a left inguinal canal hernia containing fat and short segment of colon without evidence of acute colonic obstruction. 6. Small amount of pelvic free fluid noted within posterior pelvis. 7. No small bowel or colonic obstruction. 8. There is markedly atrophic right kidney. Mild left renal atrophy. Atherosclerotic vascular calcifications.   Electronically Signed   By: Natasha MeadLiviu  Pop M.D.   On: 10/21/2013 19:31   Dg Chest 2 View 10/21/2013   CLINICAL DATA:  Upper abdominal pain.  Constipation.  EXAM: CHEST  2 VIEW  COMPARISON:  05/04/2013.  FINDINGS: Mediastinum hilar structures normal. Cardiomegaly. Pulmonary vascularity is normal. No pleural effusion or pneumothorax. Mild bibasilar subsegmental atelectasis. No acute bony abnormality. Degenerative changes both shoulders and thoracic spine .  IMPRESSION: Stable cardiomegaly. No congestive heart failure. Mild bibasilar atelectasis.   Electronically Signed   By: Maisie Fushomas  Register   On: 10/21/2013 19:28    2015:  BUN/Cr per pt's baseline. CT scan with mild  diverticulitis, as well as acute vs chronic cystitis. Udip with TNTC WBC and many bacteria; will tx for acute cystitis while UC is pending.  Dx and testing d/w pt.  Questions answered.  Verb understanding. Pt states he "wants to leave right now" and does not want to be admitted to the hospital for IV antibiotics. Pt makes his own medical decisions. [facility] Pharmacist called to assist with abx choice/dosing: recommends Augmentin 500mg  PO q12h. Pt agrees to take 1st dose while he is in the ED "and then I want to go." Pt again offered admission and refuses. Pt strongly encouraged to f/u with his PMD on Monday and return to the ED if worsening, he changes his mind about admission, or for any other concerns. Pt verb understanding.    Samuel JesterKathleen Teon Hudnall, DO 10/24/13 1635

## 2013-10-21 NOTE — ED Notes (Signed)
States he has impacted bowels and has been that  way for over a week. States her has tried OTC laxatives without relief

## 2013-10-24 LAB — URINE CULTURE

## 2013-10-25 ENCOUNTER — Telehealth (HOSPITAL_BASED_OUTPATIENT_CLINIC_OR_DEPARTMENT_OTHER): Payer: Self-pay | Admitting: Emergency Medicine

## 2013-10-25 NOTE — Telephone Encounter (Signed)
Post ED Visit - Positive Culture Follow-up: Successful Patient Follow-Up  Culture assessed and recommendations reviewed by: []  Wes Dulaney, Pharm.D., BCPS [x]  Celedonio MiyamotoJeremy Frens, [address] BCPS []  Georgina PillionElizabeth Martin, [address] BCPS []  [place] CityMinh Pham, [address] BCPS, AAHIVP []  Estella HuskMichelle Turner, Pharm.D., BCPS, AAHIVP []  Red ChristiansSamson Lee, Pharm.D. []  Tennis Mustassie Stewart, [address]  Positive urine culture  []  Patient discharged without antimicrobial prescription and treatment is now indicated [x]  Organism is resistant to prescribed ED discharge antimicrobial []  Patient with positive blood cultures  Changes discussed with ED provider: Elpidio AnisShari Upstill PA New antibiotic prescription stop augmentin, start cipro 500mg  po daily x 7 days Called to [facility] [phone]  Contacted patient, date  10/25/13, time 1005   Berle MullMiller, Quinetta Shilling 10/25/2013, 10:06 AM

## 2013-10-25 NOTE — Progress Notes (Signed)
ED Antimicrobial Stewardship Positive Culture Follow Up   Fabio BeringBenjamin R Ruelas is an 80 y.o. male who [facility] with a chief complaint of  Chief Complaint  Patient presents with  . Constipation    Recent Results (from the past 720 hour(s))  URINE CULTURE     Status: None   Collection Time    10/21/13  6:11 PM      Result Value Ref Range Status   Specimen Description URINE, CATHETERIZED   Final   Special Requests NONE   Final   Culture  Setup Time     Final   Value: 10/21/2013 23:02     Performed at [facility]   Colony Count     Final   Value: >=100,000 COLONIES/ML     Performed at [facility]   Culture     Final   Value: ENTEROBACTER CLOACAE     Performed at [facility]   Report Status 10/24/2013 FINAL   Final   Organism ID, Bacteria ENTEROBACTER CLOACAE   Final    [x]  Treated with augmentin, organism resistant to prescribed antimicrobial []  Patient discharged originally without antimicrobial agent and treatment is now indicated  New antibiotic prescription: ciprofloxacin 500mg  po daily for 7 days  ED Provider: Levander CampionSheri Upstill, PA-C   Mickeal SkinnerFrens, Cadan Maggart John 10/25/2013, 9:23 AM Infectious Diseases Pharmacist Phone# [phone]

## 2013-11-02 ENCOUNTER — Encounter (HOSPITAL_COMMUNITY): Payer: Self-pay | Admitting: Psychiatry

## 2013-11-02 ENCOUNTER — Ambulatory Visit (INDEPENDENT_AMBULATORY_CARE_PROVIDER_SITE_OTHER): Payer: No Typology Code available for payment source | Admitting: Psychiatry

## 2013-11-02 DIAGNOSIS — F329 Major depressive disorder, single episode, unspecified: Secondary | ICD-10-CM

## 2013-11-02 DIAGNOSIS — F32A Depression, unspecified: Secondary | ICD-10-CM

## 2013-11-02 NOTE — Progress Notes (Signed)
Patient:   James Park   DOB:   12/29/1929  MR Number:  [id number]  Location:  [address], [place], [place] [address]  Date of Service:   Wednesday 11/02/2013   Start Time:   3:00 PM End Time:   4:00 PM  Provider/Observer:  Florencia ReasonsPeggy Bynum, MSW, LCSW   Billing Code/Service:  [phone]  Chief Complaint:     Chief Complaint  Patient presents with  . Other    no energy, no interest    Reason for Service:  The patient is seeking services due to having no energy and losing interest in activities for the past several months. Per his report, he retired in May 2014 after having a stroke.  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 He expresses concerns about his physical health as he has suffered from diverticulitis as well as constipation. He talks to one of his daughters daily on the phone and goes out to dinner with her and her husband about one time per week. His other daughter resides in [place].  He has little other social involvement as most of his friends have died. He was attending exercise classes but hasn't been in a while.   Current Status:  Patient reports feeling a little down, having no interest in activittes, low energy, and decreased libido.  Reliability of Information: Reliable  Behavioral Observation: James Park  presents as a 80 y.o.-year-old Right-handed Caucasian Male who appeared his stated age. His dress was appropriate and he was well groomed. His manners were appropriate to the situation.  There were not any physical disabilities noted.  He displayed an appropriate level of cooperation and motivation.    Interactions:    Active   Attention:   normal  Memory:   normal  Visuo-spatial:   not examined  Speech (Volume):  normal  Speech:   normal pitch and normal volume  Thought Process:  Coherent and Relevant  Though Content:  WNL  Orientation:   person, place, time/date, situation, day of week, month of year and  year  Judgment:   Good  Planning:   Good  Affect:    Appropriate  Mood:    Depressed  Insight:   Good  Intelligence:   high  Marital Status/Living: Patient was born in [place] and [place]ared in [place], [place]. Parents were married. Patient is the middle of 3 children. Father was very sickly during patient's childhood. He reports having a good loving household. during childhood. Patient is divorced but has been married twice.  First marriage ended after 13 years as patient states being a bad husband. Patient has 2 daughters, ages 3647 and 6850,  from that marriage. One lives in [place] and one lives locally. The second marriage ended after 22 years due to blended family issues. [place] alone [place]. He reports someone does come to his home and take care of home about 2 x per week. Patient is [employer] but says he does not attend church. Patient normally likes agricultural activities, working with flowers, and traveling. Currently, he just mainly watches TV.  Current Employment: Retired in May 2014 after stroke having a stroke.  Past Employment:  Nurse, adultCriminal Defense Attorney for 55 years.  Substance Use:  Patient reports alcohol use - 2 beers daily.  Education:   Maryagnes AmosLaw Degree from [employer]  Medical History:   Past Medical History  Diagnosis Date  . Atrial fibrillation 02/2011  Not anticoagulated  . Right bundle branch block   . Essential hypertension, benign   . Anxiety   . Depression   . Constipation   . GERD (gastroesophageal reflux disease)   . Arthritis   . Urinary retention     Self-catheterization when necessary  . Rotator cuff tear     Right - not repaired  . Stroke 05/2012    Gait disturbance only residual - hemorrhagic stroke  . CKD (chronic kidney disease) stage 5, GFR less than 15 ml/min     Previously on hemodialysis  . Pulmonary hypertension     Severe  . Chronotropic incompetence     Suspected based  on GXT    Sexual History:   History  Sexual Activity  . Sexual Activity: Not on file    Abuse/Trauma History: Patient denies.  Psychiatric History:  Patient reports no psychiatric hospitalizations. He reports seeing a psychiatrist one time but said she immediately reached for prescription pad and he decided never to go back. Patient is opposed to psychotropic medication at this time due to fear of becoming dependent on medication. Patient reports no history of taking psychotropic medicatiions. Patient reports going to marriage counseling briefly during his second marriage.   Family Med/Psych History:  Family History  Problem Relation Age of Onset  . Parkinson's disease Brother   He denies any family mental health history.   Risk of Suicide/Violence: Patient denies past and current suicidal and homicidal ideations. He denies any self-injurious behaviors and reports no history of aggression or violence.   Impression/DX:  The patient presents with symptoms of depression that have been present for the past several months per his report. 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 He reports adjustment issues regarding retirement  and concerns about his physical health.  Patient's current symptoms include depressed mood, having no interest in activittes, low energy, and decreased libido. Patient reports no previous depressive episodes and no manic episodes.  Diagnosis: Depressive Disorder NOS, rule out MDD, Single Episode, Moderate  Disposition/Plan:  The patient attends the assessment appointment today. Confidentiality and limits are discussed. Patient agrees to return for an appointment in 2 weeks for continuing assessment and treatment planning. Patient does not want to consider referral for medication evaluation at this time. Therapist will continue discussion with patient regarding possible medication evaluation during next appointment. Patient agrees to call  this practice, call 911, or have someone take him to the ER should symptoms worsen.  Diagnosis:    Axis I:  Depressive Disorder NOS, rule out MDD      Axis II: No diagnosis       Axis III:   Past Medical History  Diagnosis Date  . Atrial fibrillation 02/2011    Not anticoagulated  . Right bundle branch block   . Essential hypertension, benign   . Anxiety   . Depression   . Constipation   . GERD (gastroesophageal reflux disease)   . Arthritis   . Urinary retention     Self-catheterization when necessary  . Rotator cuff tear     Right - not repaired  . Stroke 05/2012    Gait disturbance only residual - hemorrhagic stroke  . CKD (chronic kidney disease) stage 5, GFR less than 15 ml/min     Previously on hemodialysis  . Pulmonary hypertension     Severe  . Chronotropic incompetence     Suspected based on GXT  Axis IV:  problems related to social environment          Axis V:  51-60 moderate symptoms

## 2013-11-04 ENCOUNTER — Encounter (HOSPITAL_COMMUNITY): Payer: Self-pay | Admitting: Psychiatry

## 2013-11-04 NOTE — Patient Instructions (Signed)
Discussed orally 

## 2013-11-17 ENCOUNTER — Ambulatory Visit (HOSPITAL_COMMUNITY)
Admission: RE | Admit: 2013-11-17 | Discharge: 2013-11-17 | Disposition: A | Discharge status: Home / Self Care | Payer: Medicare Other | Source: Ambulatory Visit | Attending: Internal Medicine | Admitting: Internal Medicine

## 2013-11-17 ENCOUNTER — Other Ambulatory Visit (HOSPITAL_COMMUNITY): Payer: Self-pay | Admitting: Internal Medicine

## 2013-11-17 DIAGNOSIS — R509 Fever, unspecified: Secondary | ICD-10-CM | POA: Diagnosis not present

## 2013-11-17 DIAGNOSIS — R059 Cough, unspecified: Secondary | ICD-10-CM

## 2013-11-17 DIAGNOSIS — R918 Other nonspecific abnormal finding of lung field: Secondary | ICD-10-CM | POA: Insufficient documentation

## 2013-11-17 DIAGNOSIS — R05 Cough: Secondary | ICD-10-CM | POA: Diagnosis present

## 2013-11-28 ENCOUNTER — Encounter: Payer: Self-pay | Admitting: Internal Medicine

## 2013-11-30 ENCOUNTER — Encounter: Payer: Self-pay | Admitting: Gastroenterology

## 2013-11-30 ENCOUNTER — Ambulatory Visit (INDEPENDENT_AMBULATORY_CARE_PROVIDER_SITE_OTHER): Payer: Medicare Other | Admitting: Gastroenterology

## 2013-11-30 VITALS — BP 149/51 | HR 43 | Temp 97.1°F | Ht 70.0 in | Wt 167.0 lb

## 2013-11-30 DIAGNOSIS — K5900 Constipation, unspecified: Secondary | ICD-10-CM

## 2013-11-30 DIAGNOSIS — R933 Abnormal findings on diagnostic imaging of other parts of digestive tract: Secondary | ICD-10-CM

## 2013-11-30 DIAGNOSIS — R634 Abnormal weight loss: Secondary | ICD-10-CM

## 2013-11-30 MED ORDER — LUBIPROSTONE 8 MCG PO CAPS: 8.0000 ug | ORAL_CAPSULE | Freq: Two times a day (BID) | ORAL | Status: DC

## 2013-11-30 NOTE — Patient Instructions (Signed)
1. Start Amitiza 8mcg by mouth twice daily with food. Samples provided. Prescription sent to your pharmacy. 2. I will discuss your CT with radiologist and Dr. Jena Gaussourk. Further recommendations to follow.

## 2013-11-30 NOTE — Progress Notes (Addendum)
Primary Care Physician:  Carylon PerchesFAGAN,ROY, [place]  Primary Gastroenterologist:  James SessionsMichael Rourk, [place]   Chief Complaint  Patient presents with  . Referral    HPI:  [place] Park is a 80 y.o. male here at the request of Dr. Ouida SillsFagan for further evaluation of constipation. He has tried and failed MiraLAX, fiber. Recently placed on lactulose but did not like this and it was ineffective. Advised against magnesium citrate due to renal failure. Seen in the Emergency department. Treated for possible diverticulitis and cystitis. Given Augmentin. Took for about 5 days but then received a phone call to switch to Cipro due to resistant organism found on urine culture. Weight is down about 10 pounds lost few weeks. Patient was unaware of any weight loss until in our office today. Complains of ongoing constipation. Had a good bowel movement this morning after taking 4 Dulcolax last night. Denies any abdominal pain, fever, melena, rectal bleeding. Occasionally has heartburn. Appetite is stable. Remote colonoscopy every 10 years ago. Patient is not sure whether he had polyps or not.    Current Outpatient Prescriptions  Medication Sig Dispense Refill  . albuterol (PROVENTIL) (2.5 MG/3ML) 0.083% nebulizer solution Take 2.5 mg by nebulization every 6 (six) hours as needed for wheezing or shortness of breath.    . calcium acetate (PHOSLO) 667 MG capsule Take 667 mg by mouth 3 (three) times daily with meals.     . doxazosin (CARDURA) 1 MG tablet Take 1 mg by mouth daily.    . fluticasone (FLONASE) 50 MCG/ACT nasal spray Place 1 spray into both nostrils daily as needed for allergies or rhinitis.    Marland Kitchen. losartan (COZAAR) 50 MG tablet Take 50 mg by mouth 2 (two) times daily.    . Melatonin 5 MG TABS Take 5 mg by mouth at bedtime as needed (Sleep).    . sodium bicarbonate 650 MG tablet Take 650 mg by mouth daily.    .         No current facility-administered medications for this visit.    Allergies as of 11/30/2013 - Review  Complete 11/30/2013  Allergen Reaction Noted  . Altace [ramipril] Hives 06/11/2012    Past Medical History  Diagnosis Date  . Atrial fibrillation 02/2011    Not anticoagulated  . Right bundle branch block   . Essential hypertension, benign   . Anxiety   . Depression   . Constipation   . GERD (gastroesophageal reflux disease)   . Arthritis   . Urinary retention     Self-catheterization when necessary  . Rotator cuff tear     Right - not repaired  . Stroke 05/2012    Gait disturbance only residual - hemorrhagic stroke  . CKD (chronic kidney disease) stage 5, GFR less than 15 ml/min     Previously on hemodialysis  . Pulmonary hypertension     Severe  . Chronotropic incompetence     Suspected based on GXT    Past Surgical History  Procedure Laterality Date  . Knee surgery      Tendon repair  . Eye surgery Bilateral     Cataract  . Insertion of dialysis catheter Right 06/30/2012    Procedure: INSERTION OF DIALYSIS CATHETER right IJ;  Surgeon: Larina Earthlyodd F Early, [place];  [facility];  Service: Vascular;  Laterality: Right;    Family History  Problem Relation Age of Onset  . Parkinson's disease Brother     History   Social History  . Marital Status: Divorced  Spouse Name: N/A    Number of Children: N/A  . Years of Education: N/A   Occupational History  . Not on file.   Social History Main Topics  . Smoking status: Never Smoker   . Smokeless tobacco: Not on file     Comment: quit 1967  . Alcohol Use: 1.2 oz/week    2 Cans of beer per week     Comment: 2 cans of beer daily  . Drug Use: No  . Sexual Activity: Not on file   Other Topics Concern  . Not on file   Social History Narrative      ROS:  General: Negative for anorexia, weight loss, fever, chills, fatigue, weakness. Eyes: Negative for vision changes.  ENT: Negative for hoarseness, difficulty swallowing , nasal congestion. CV: Negative for chest pain, angina, palpitations, dyspnea on exertion,  peripheral edema.  Respiratory: Negative for dyspnea at rest, dyspnea on exertion, cough, sputum, wheezing.  GI: See history of present illness. GU:  Negative for dysuria, hematuria, urinary incontinence, urinary frequency, nocturnal urination.  MS: Negative for joint pain, low back pain.  Derm: Negative for rash or itching.  Neuro: Negative for weakness, abnormal sensation, seizure, frequent headaches, memory loss, confusion.  Psych: Negative for anxiety, depression, suicidal ideation, hallucinations.  Endo: Negative for unusual weight change.  Heme: Negative for bruising or bleeding. Allergy: Negative for rash or hives.    Physical Examination:  BP 149/51 mmHg  Pulse 43  Temp(Src) 97.1 F (36.2 C) (Oral)  Ht 5\' 10"  (1.778 m)  Wt 167 lb (75.751 kg)  BMI 23.96 kg/m2   General: Well-nourished, well-developed in no acute distress.  Head: Normocephalic, atraumatic.   Eyes: Conjunctiva pink, no icterus. Mouth: Oropharyngeal mucosa moist and pink , no lesions erythema or exudate. Neck: Supple without thyromegaly, masses, or lymphadenopathy.  Lungs: Clear to auscultation bilaterally.  Heart: Regular rate and rhythm, no murmurs rubs or gallops.  Abdomen: Bowel sounds are normal, nontender, nondistended, no hepatosplenomegaly or masses, no abdominal bruits or    hernia , no rebound or guarding.   Rectal: not performed Extremities: No lower extremity edema. No clubbing or deformities.  Neuro: Alert and oriented x 4 , grossly normal neurologically.  Skin: Warm and dry, no rash or jaundice.   Psych: Alert and cooperative, normal mood and affect.  Labs: Lab Results  Component Value Date   WBC 5.7 10/21/2013   HGB 11.4* 10/21/2013   HCT 34.2* 10/21/2013   MCV 88.8 10/21/2013   PLT 110* 10/21/2013   Hemoglobin 12/hematocrit 36.8, platelets 153,000 on 09/20/2013. Creatinine 4.75 in September 2015.  Lab Results  Component Value Date   CREATININE 4.78* 10/21/2013   BUN 88*  10/21/2013   NA 135* 10/21/2013   K 5.0 10/21/2013   CL 102 10/21/2013   CO2 19 10/21/2013   Lab Results  Component Value Date   ALT 9 10/21/2013   AST 14 10/21/2013   ALKPHOS 131* 10/21/2013   BILITOT 0.5 10/21/2013   Lab Results  Component Value Date   LIPASE 35 10/21/2013     Imaging Studies: Dg Chest 2 View  11/17/2013   CLINICAL DATA:  Cough and fever since last night  EXAM: CHEST  2 VIEW  COMPARISON:  10/9/ 15  FINDINGS: The cardiac shadow is again enlarged in size. The lungs are well aerated bilaterally. Right lower lobe infiltrate is identified. No sizable effusion is seen. No bony abnormality is noted.  IMPRESSION: New right lower lobe infiltrate.  Electronically Signed   By: Alcide CleverMark  Lukens M.D.   On: 11/17/2013 16:01   CLINICAL DATA: Constipation  EXAM: CT ABDOMEN AND PELVIS WITHOUT CONTRAST  TECHNIQUE: Multidetector CT imaging of the abdomen and pelvis was performed following the standard protocol without IV contrast.  COMPARISON: None.  FINDINGS: Sagittal images shows degenerative changes thoracolumbar spine. There is moderate compression fracture lower endplate of L3 vertebral body of indeterminate age. Clinical correlation is necessary. No paraspinal hematoma to suggest and acute fracture.  The study is limited without IV contrast. Small hiatal hernia is noted. There is cardiomegaly. Atherosclerotic calcifications of coronary arteries. Mitral valve calcifications are noted.  Lung bases are unremarkable. Unenhanced liver shows no biliary ductal dilatation. Trace perihepatic ascites. No calcified gallstones are noted within gallbladder. Extensive atherosclerotic calcifications are noted abdominal aorta, bilateral renal artery, SMA and iliac arteries. There is aneurysmal dilatation of abdominal aorta measures up to 3.2 cm in diameter.  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 Aneurysmal dilatation  of left common iliac artery measures 1.5 cm in diameter.  Mild acutely atrophic right kidney with cortical thinning. Atherosclerotic vascular calcifications are noted bilateral kidney. There is some cortical thinning due to atrophy in left kidney. No hydronephrosis.  Oral contrast material was given to the patient. No small bowel obstruction. No pericecal inflammation. The terminal ileum is unremarkable. Scattered diverticula are noted in descending colon. Multiple sigmoid colon diverticula are noted.  In axial image 62 there is mild stranding of pericolonic fat in left pelvis just above the urinary bladder. Findings are highly suspicious for mild diverticulitis. There is no evidence of abscess. No extraluminal contrast material is noted. There is thickening of urinary bladder wall. This may be due to chronic inflammation or cystitis. Small amount of free fluid is noted in posterior pelvis. Prostate gland and seminal vesicles are unremarkable. There is a left inguinal scrotal canal hernia containing short segment of descending colon without evidence of acute colonic obstruction. Fat is noted in the inferior aspect of the hernia. No destructive bony lesions are noted within pelvis.  IMPRESSION: 1. There is moderate compression fracture lower endplate of L3 vertebral body probable chronic in nature. Clinical correlation is necessary. Degenerative changes thoracolumbar spine. 2. Small hiatal hernia. 3. Descending colon diverticula are noted. Sigmoid colon diverticula are noted. There is mild stranding of pericolonic fat in left pelvis proximal sigmoid colon. Findings are suspicious for mild diverticulitis. No diverticular abscess is noted. No extraluminal contrast material is noted. 4. Mild thickening of urinary bladder wall. This may be due to chronic inflammation or cystitis. 5. There is a left inguinal canal hernia containing fat and short segment of colon without evidence  of acute colonic obstruction. 6. Small amount of pelvic free fluid noted within posterior pelvis. 7. No small bowel or colonic obstruction. 8. There is markedly atrophic right kidney. Mild left renal atrophy. Atherosclerotic vascular calcifications.   Electronically Signed  By: Natasha MeadLiviu Pop M.D.  On: 10/21/2013 19:31

## 2013-12-02 ENCOUNTER — Telehealth: Payer: Self-pay | Admitting: General Practice

## 2013-12-02 MED ORDER — LUBIPROSTONE 24 MCG PO CAPS: 24.0000 ug | ORAL_CAPSULE | Freq: Two times a day (BID) | ORAL | Status: DC

## 2013-12-02 NOTE — Addendum Note (Signed)
Addended by: Tiffany KocherLEWIS, LESLIE S on: 12/02/2013 11:53 AM   Modules accepted: Orders, Medications

## 2013-12-02 NOTE — Telephone Encounter (Signed)
Patient called and stated he started the Amitiza on 11/18 after his office visit.  He still having pain and he is not getting any relief.  He would like for Verlon AuLeslie to give him a call, because he doesn't want to go through the weekend feeling like this.  Routing to [place]lie for advice.

## 2013-12-02 NOTE — Telephone Encounter (Signed)
I spoke with Joselyn Glassmanyler at [facility]. He said he would make sure the pt gets the amitiza 24mcg.

## 2013-12-02 NOTE — Assessment & Plan Note (Signed)
80 year old gentleman with complaints of constipation for 1 month's duration. CT back on October 9 suggested mild diverticulitis. Treated with Augmentin initially but switched to Cipro but urine culture sensitivities returned. Patient denies abdominal pain. Remote colonoscopy. Mild anemia/thrombocytopenia almost recent labs although thrombocytopenia appears to be intermittent. Given the current abdominal pain, fever will hold off on further antibiotic therapy.  Address constipation initially. Start Amitiza 8 g twice a day I will discuss CT findings with Dr. Jena Gaussourk decision to be made whether or not to offer patient colonoscopy for evaluation of abnormal CT findings. Further recommendations to follow.

## 2013-12-02 NOTE — Telephone Encounter (Addendum)
Patient called. He states he hasn't been able to move his bowels still. Has been several days. Some lower abd pain. No dysuria. No vomiting. No fever. Has taken 4 amitiza over the past 48 hours without results. Wants relief.  Advised to take 24mcg of Amitiza this afternoon. If needed can take Dulcolax 10mg  today as well. Start Amitiza 24mcg BID tomorrow. New RX sent.   Raynelle FanningJulie, can you call pharmacy and cancel the Amitiza 8mcg writtent on 11/30/13?  Patient was advised how to reach on call GI physician this weekend if needed. Warning signs discussed for which he should go to ER. Otherwise he can touch base with us on Monday.

## 2013-12-05 ENCOUNTER — Telehealth: Payer: Self-pay

## 2013-12-05 DIAGNOSIS — K5900 Constipation, unspecified: Secondary | ICD-10-CM

## 2013-12-05 DIAGNOSIS — R1030 Lower abdominal pain, unspecified: Secondary | ICD-10-CM

## 2013-12-05 NOTE — Telephone Encounter (Signed)
Pt called- he had one bm on Saturday and nothing since then. Pt said he was waiting for LSL to get back with him after she talked to RMR and the radiologist about his ct results. He said he has had everything that can possibly be done in radiology and he said that it is time for some answers. He wants to know what is wrong.  He also requested an appt with RMR. He was given an appt on 01/10/14 at 9am with RMR.  Pt is upset that he is not getting any answers.  His phone numbers are [phone] or [phone]

## 2013-12-06 ENCOUNTER — Ambulatory Visit (HOSPITAL_COMMUNITY)
Admission: RE | Admit: 2013-12-06 | Discharge: 2013-12-06 | Disposition: A | Discharge status: Home / Self Care | Payer: Medicare Other | Source: Ambulatory Visit | Attending: Gastroenterology | Admitting: Gastroenterology

## 2013-12-06 ENCOUNTER — Encounter: Payer: Self-pay | Admitting: Internal Medicine

## 2013-12-06 DIAGNOSIS — K5900 Constipation, unspecified: Secondary | ICD-10-CM | POA: Diagnosis not present

## 2013-12-06 DIAGNOSIS — R1030 Lower abdominal pain, unspecified: Secondary | ICD-10-CM | POA: Diagnosis present

## 2013-12-06 DIAGNOSIS — N189 Chronic kidney disease, unspecified: Secondary | ICD-10-CM | POA: Diagnosis not present

## 2013-12-06 DIAGNOSIS — K4090 Unilateral inguinal hernia, without obstruction or gangrene, not specified as recurrent: Secondary | ICD-10-CM | POA: Insufficient documentation

## 2013-12-06 NOTE — Telephone Encounter (Signed)
LSL saw recently

## 2013-12-06 NOTE — Telephone Encounter (Signed)
Per Tana CoastLeslie Lewis, PA I am routing this note back to her.

## 2013-12-06 NOTE — Telephone Encounter (Signed)
Spoke with patient. He plans to go for abd film around 2pm.  He will come by and pick up Linzess take one daily on empty stomach. Advised to stop the Amitiza. Give #20 please.   Please write instructions for Linzess and D/C amitiza instructions for patient.

## 2013-12-06 NOTE — Telephone Encounter (Signed)
Samples and instructions are at the front desk.

## 2013-12-06 NOTE — Telephone Encounter (Signed)
Routing to [place]Julie per [place].

## 2013-12-06 NOTE — Telephone Encounter (Signed)
I have discussed with Dr. Jena Gaussourk at length today.   I reviewed his CT with Dr. Charlett NoseKevin Dover. He had very mild if any diverticulitis (could have been very early but not very significant). He had small loop of sigmoid colon in a left inguinal canal hernia (mid to upper half) without obstruction. No evidence of splenomegaly (patient has thrombocytopenia).   Per Dr. Jena Gaussourk. Patient needs plain abd film TODAY. If ok, then we will switch to Linzess 145mcg daily and provide samples.   LMOAM at home at 8:05. Tried to call patient on cell phone at 8:07 but no answer or voice mail.

## 2013-12-06 NOTE — Telephone Encounter (Signed)
Discussed with LSL; agreed with plan

## 2013-12-06 NOTE — Progress Notes (Signed)
Quick Note:  Please let patient know there are no signs of obstruction. Most of the bowel is full of liquid stool. He can start Linzess as discussed. Stop Amitiza. Suspect bowels will start moving soon based on xray.  I will address further with Dr. Jena Gaussourk for further recommendations. Will be tomorrow before I discuss with him. ______

## 2013-12-07 ENCOUNTER — Telehealth: Payer: Self-pay | Admitting: *Deleted

## 2013-12-07 ENCOUNTER — Ambulatory Visit (HOSPITAL_COMMUNITY): Payer: Self-pay | Admitting: Psychiatry

## 2013-12-07 NOTE — Progress Notes (Signed)
Quick Note:  Please let patient know, per RMR, continue Linzess through the weekend and call Monday with PR. Keep upcoming OV with RMR. ______

## 2013-12-07 NOTE — Telephone Encounter (Signed)
   PA  Amitiza  Approved

## 2013-12-07 NOTE — Telephone Encounter (Signed)
  PA  Amitiza  Approved 

## 2013-12-07 NOTE — Progress Notes (Signed)
cc'ed to pcp °

## 2013-12-07 NOTE — Telephone Encounter (Signed)
Patient was switched to Linzess.

## 2013-12-12 ENCOUNTER — Telehealth: Payer: Self-pay | Admitting: Internal Medicine

## 2013-12-12 MED ORDER — LINACLOTIDE 290 MCG PO CAPS: 290.0000 ug | ORAL_CAPSULE | Freq: Every day | ORAL | Status: DC

## 2013-12-12 NOTE — Telephone Encounter (Addendum)
Let's increase to Linzess 290 mcg daily. If he has no significant improvement with this, would recommend colonoscopy.   As for lower abdominal pain, likely secondary to constipation. He has a known left inguinal hernia. If this pain is WORSE than baseline, would need to see PCP or urgent evaluation. Progress report tomorrow.

## 2013-12-12 NOTE — Telephone Encounter (Signed)
I feel he needs a colonoscopy, specifically after review of multiple phone notes and last office visit. He had a colonoscopy in the remote past, but with this new change in bowel habits, he needs a repeat lower GI evaluation.   If he would like to continue the Linzess 145 mcg that he has and add miralax once to twice a day in the interim, that is all the additional advice I can give.   We could do a modified bowel prep to get things moving again, then start from a clean slate. In order to proceed with a colonoscopy, he will need to have 2 days of clear liquids before starting the prep. I recommend a colonoscopy with Dr. Jena Gaussourk.

## 2013-12-12 NOTE — Telephone Encounter (Signed)
I called pt and informed of Anna's recommendations. He said that he has heard that at his age you should not have a colonoscopy. He is just adamant about wanting to talk to an expert. I asked him who did he feel was an expert on this and he said Dr. Jena Gaussourk.   He said he does not want a call from Dr. Jena Gaussourk, he wants to see Dr. Jena Gaussourk.  I told him that I would get the message to Dr. Jena Gaussourk.   I did ask him to continue the Linzess 145 mcg qd at this time and the Miralax.  He said he would do the Linzess but made no promise of the Miralax.

## 2013-12-12 NOTE — Telephone Encounter (Signed)
Pt called back and said he did not get the prescription for the Linzess 290 mcg. It was going to cost him $353.00. He said without a diagnosis as to what his problem really is, he will not buy it. He said " just trying this and trying that without a diagnosis" he cannot see doing it. He said what he really wants is a diagnosis. I told him I would let Gerrit HallsAnna Sams, NP know of his concerns.

## 2013-12-12 NOTE — Telephone Encounter (Signed)
PATIENT CAME IN OFFICE STATING THAT EVEN WITH HIS MEDICINE CHANGE HE HAS STILL NOT HAD A BOWEL MOVEMENT   PLEASE ADVISE.

## 2013-12-12 NOTE — Addendum Note (Signed)
Addended by: Nira RetortSAMS, Lorenda Grecco W on: 12/12/2013 11:09 AM   Modules accepted: Orders

## 2013-12-12 NOTE — Telephone Encounter (Signed)
Pt is aware and will pick up the new prescription and begin and call tomorrow with PR.

## 2013-12-12 NOTE — Telephone Encounter (Signed)
I spoke to the pt. He said he has still not had a BM since the day he was seen here in the office, 11/30/2013.  The Linzess 145 mcg daily is not working. He has taken it everyday until today, he said he did not take it, he got nauseated with it yesterday,. He is taking it without food.  He said he has to have something done for him, he cannot keep going like this. Said his appt with Dr. Jena Gaussourk is 01/17/2014 and he cannot wait that long to see a doctor.  He was complaining with getting weaker and losing weight. I had him weigh and he weighed 170.8.   Said he needs to know what to do and if doctors here are too busy, he will just have to go somewhere else.  He said he was hurting in his stomach, but he put his hands down in groin area on both sides, and said he had a knot on the left side.   He can be reached at [phone] or [phone].  Please advise! ( Sending to Gerrit HallsAnna Sams, NP in Leslie's absence)

## 2013-12-12 NOTE — Telephone Encounter (Signed)
Make room for him on Wednesday schedule this week

## 2013-12-12 NOTE — Telephone Encounter (Signed)
May provide voucher. I sent in prescription.

## 2013-12-13 NOTE — Telephone Encounter (Signed)
Misty StanleyStacey, please schedule pt for Wed 3:30 PM. Thanks!

## 2013-12-13 NOTE — Telephone Encounter (Signed)
Per Misty StanleyStacey, pt is aware of his OV on Wed at 3:30 PM with Dr.Rourk.

## 2013-12-13 NOTE — Telephone Encounter (Signed)
SPOKE TO PATIENT ON PHONE AND HE IS AWARE OF APPOINTMENT

## 2013-12-14 ENCOUNTER — Other Ambulatory Visit: Payer: Self-pay

## 2013-12-14 ENCOUNTER — Ambulatory Visit (INDEPENDENT_AMBULATORY_CARE_PROVIDER_SITE_OTHER): Payer: Medicare Other | Admitting: Internal Medicine

## 2013-12-14 ENCOUNTER — Encounter: Payer: Self-pay | Admitting: Internal Medicine

## 2013-12-14 VITALS — BP 152/44 | HR 40 | Temp 96.6°F | Ht 70.0 in | Wt 171.8 lb

## 2013-12-14 DIAGNOSIS — K5900 Constipation, unspecified: Secondary | ICD-10-CM

## 2013-12-14 DIAGNOSIS — K4030 Unilateral inguinal hernia, with obstruction, without gangrene, not specified as recurrent: Secondary | ICD-10-CM

## 2013-12-14 NOTE — Patient Instructions (Addendum)
Will obtain a barium enema to evaluate your lower colon to see whether or not it's getting caught in the hernia on the left side  CBC and comprehensive metabolic profile 12 to be drawn tomorrow.  Take Linzess  every other day to every third day for constipation as needed.. Take 2 prunes every day as a matter of routine.  Further recommendations to follow.

## 2013-12-14 NOTE — Progress Notes (Signed)
Primary Care Physician:  Carylon PerchesFAGAN,ROY, MD Primary Gastroenterologist:  Dr. Jena Gaussourk  Pre-Procedure History & Physical: HPI:  James Park is a 80 y.o. male here for follow-up of constipation. 2 month history of difficulty having a bowel movement. Seen in the ED and in our office on multiple occasions. CT scan has demonstrated left inguinal hernia with notable amount of colon down in the hernia sac. No obstruction including upstream dilation or backup of stool noted on CT or subsequent plain films. He has failed to improve with a course of lactulose, MiraLAX and Amitiza. More recently Linzess produce diarrhea after a single dose. It's been a long time since he had a colonoscopy ( well over 10 years ago-reports it being done [facility]). Has lost some weight recently. Complaints of "fatigue all the time". No very recent lab work. Hasn't had any nausea or vomiting. Typically eats one meal a day. Patient with advanced renal failure previously on dialysis came off on his own and states he will not go back on this treatment.  Patient wants to know if he can just take a couple of prunes daily to facilitate bowel function.  Past Medical History  Diagnosis Date  . Atrial fibrillation 02/2011    Not anticoagulated  . Right bundle branch block   . Essential hypertension, benign   . Anxiety   . Depression   . Constipation   . GERD (gastroesophageal reflux disease)   . Arthritis   . Urinary retention     Self-catheterization when necessary  . Rotator cuff tear     Right - not repaired  . Stroke 05/2012    Gait disturbance only residual - hemorrhagic stroke  . CKD (chronic kidney disease) stage 5, GFR less than 15 ml/min     Previously on hemodialysis  . Pulmonary hypertension     Severe  . Chronotropic incompetence     Suspected based on GXT    Past Surgical History  Procedure Laterality Date  . Knee surgery      Tendon repair  . Eye surgery Bilateral     Cataract  . Insertion of  dialysis catheter Right 06/30/2012    Procedure: INSERTION OF DIALYSIS CATHETER right IJ;  Surgeon: Larina Earthlyodd F Early, MD;  [facility];  Service: Vascular;  Laterality: Right;  . Tonsillectomy    . Appendectomy      Prior to Admission medications   Medication Sig Start Date End Date Taking? Authorizing Provider  albuterol (PROVENTIL) (2.5 MG/3ML) 0.083% nebulizer solution Take 2.5 mg by nebulization every 6 (six) hours as needed for wheezing or shortness of breath.    Historical Provider, MD  calcium acetate (PHOSLO) 667 MG capsule Take 667 mg by mouth 3 (three) times daily with meals.  05/23/13   Historical Provider, MD  doxazosin (CARDURA) 1 MG tablet Take 1 mg by mouth daily.    Historical Provider, MD  fluticasone (FLONASE) 50 MCG/ACT nasal spray Place 1 spray into both nostrils daily as needed for allergies or rhinitis.    Historical Provider, MD  Linaclotide Karlene Einstein(LINZESS) 290 MCG CAPS capsule Take 1 capsule (290 mcg total) by mouth daily. 12/12/13   Nira RetortAnna W Sams, NP  losartan (COZAAR) 50 MG tablet Take 50 mg by mouth 2 (two) times daily. 06/13/12   Milana ObeyStephen D Knowlton, MD  lubiprostone (AMITIZA) 24 MCG capsule Take 1 capsule (24 mcg total) by mouth 2 (two) times daily with a meal. 12/02/13   Tiffany KocherLeslie S Lewis, PA-C  Melatonin 5 MG TABS Take 5 mg by mouth at bedtime as needed (Sleep).    Historical Provider, MD  sodium bicarbonate 650 MG tablet Take 650 mg by mouth daily.    Historical Provider, MD    Allergies as of 12/14/2013 - Review Complete 12/14/2013  Allergen Reaction Noted  . Altace [ramipril] Hives 06/11/2012    Family History  Problem Relation Age of Onset  . Parkinson's disease Brother   . Colon cancer Neg Hx     History   Social History  . Marital Status: Divorced    Spouse Name: N/A    Number of Children: 2  . Years of Education: N/A   Occupational History  . Not on file.   Social History Main Topics  . Smoking status: Never Smoker   . Smokeless tobacco: Not on file      Comment: quit 1967  . Alcohol Use: 1.2 oz/week    2 Cans of beer per week     Comment: 2 cans of beer daily  . Drug Use: No  . Sexual Activity: Not on file   Other Topics Concern  . Not on file   Social History Narrative    Review of Systems: See HPI, otherwise negative ROS  Physical Exam: BP 152/44 mmHg  Pulse 40  Temp(Src) 96.6 F (35.9 C) (Oral)  Ht 5\' 10"  (1.778 m)  Wt 171 lb 12.8 oz (77.928 kg)  BMI 24.65 kg/m2 General:   Elderly frail, pleasant and cooperative in NAD Skin:  Intact without significant lesions or rashes. Eyes:  Sclera clear, no icterus.   Conjunctiva pink. Ears:  Normal auditory acuity. Nose:  No deformity, discharge,  or lesions. Mouth:  No deformity or lesions. Neck:  Supple; no masses or thyromegaly. No significant cervical adenopathy. Lungs:  Clear throughout to auscultation.   No wheezes, crackles, or rhonchi. No acute distress. Heart:  Regular rate and rhythm; no murmurs, clicks, rubs,  or gallops. Abdomen: Non-distended, normal bowel sounds.  No bruits. Soft and nontender without appreciable hepatosplenomegaly. Patient with a "tennis balll" sized bulge in his left femoral area. This was easily reducible by me. Pulses:  Normal pulses noted. Extremities:  Without clubbing or edema. Rectal:  No mass.  No impaction.  Scant brown stoll.  hemocult negative.     Impression:  80 year old gentleman with multiple comorbidities continues to complain somewhat bitterly of perceived difficulties with constipation. I have reviewed his recent CT with Dr. Tyron RussellBoles. No doubt he does have a left inguinal hernia with a portion of colon dipping down into it, at least on the CT scan. He may also have an occult left femoral hernia appreciated today on physical examination-easily reducible.  More recently, Linzess has produced results, in fact, may have overshot our endpoint a bit. Patient is interested in natural remedies as much as possible.  Although imaging does not  demonstrate any evidence of obstruction at the level of his inguinal hernia, this process may be producing a bottle neck and I could see where he could perceive difficulties with the passage of stool if his sigmoid colon has recently ventured down into the hernia sac as compared to lifelong bowel function without the hernia. Complaints of fatigue nonspecific at this time. This could easily be explained by progression of renal failure /pulmonary hypertension, etc.  At this time, there is no indication for colonoscopy.  He would be an extremely poor surgical candidate, even for surgical repair of a inguinal/femoral hernia. Wearing of a truss  may help this situation.  Recommendations:  Will obtain a barium enema to evaluate the anatomy of the distal colon further.   CBC and comprehensive metabolic profile 12 to be drawn tomorrow.  Take Linzess  every other day to every third day for constipation as needed. Take 2 prunes every day as a matter of routine.  Further recommendations to follow.      Notice: This dictation was prepared with Dragon dictation along with smaller phrase technology. Any transcriptional errors that result from this process are unintentional and may not be corrected upon review.

## 2013-12-16 LAB — COMPREHENSIVE METABOLIC PANEL
ALK PHOS: 119 U/L — AB (ref 39–117)
ALT: <8 U/L (ref 0–53)
AST: 10 U/L (ref 0–37)
Albumin: 3.4 g/dL — ABNORMAL LOW (ref 3.5–5.2)
BILIRUBIN TOTAL: 0.6 mg/dL (ref 0.2–1.2)
BUN: 89 mg/dL — ABNORMAL HIGH (ref 6–23)
CO2: 14 mEq/L — ABNORMAL LOW (ref 19–32)
Calcium: 8.4 mg/dL (ref 8.4–10.5)
Chloride: 112 mEq/L (ref 96–112)
Creat: 5.34 mg/dL — ABNORMAL HIGH (ref 0.50–1.35)
GLUCOSE: 84 mg/dL (ref 70–99)
POTASSIUM: 5.2 meq/L (ref 3.5–5.3)
Sodium: 137 mEq/L (ref 135–145)
TOTAL PROTEIN: 6.3 g/dL (ref 6.0–8.3)

## 2013-12-16 LAB — CBC WITH DIFFERENTIAL/PLATELET
Basophils Absolute: 0.1 10*3/uL (ref 0.0–0.1)
Basophils Relative: 2 % — ABNORMAL HIGH (ref 0–1)
EOS PCT: 15 % — AB (ref 0–5)
Eosinophils Absolute: 0.6 10*3/uL (ref 0.0–0.7)
HCT: 31.8 % — ABNORMAL LOW (ref 39.0–52.0)
Hemoglobin: 10.6 g/dL — ABNORMAL LOW (ref 13.0–17.0)
LYMPHS ABS: 0.6 10*3/uL — AB (ref 0.7–4.0)
Lymphocytes Relative: 16 % (ref 12–46)
MCH: 29.8 pg (ref 26.0–34.0)
MCHC: 33.3 g/dL (ref 30.0–36.0)
MCV: 89.3 fL (ref 78.0–100.0)
MPV: 10.6 fL (ref 9.4–12.4)
Monocytes Absolute: 0.4 10*3/uL (ref 0.1–1.0)
Monocytes Relative: 11 % (ref 3–12)
Neutro Abs: 2.2 10*3/uL (ref 1.7–7.7)
Neutrophils Relative %: 56 % (ref 43–77)
Platelets: 150 10*3/uL (ref 150–400)
RBC: 3.56 MIL/uL — ABNORMAL LOW (ref 4.22–5.81)
RDW: 16 % — ABNORMAL HIGH (ref 11.5–15.5)
WBC: 3.9 10*3/uL — ABNORMAL LOW (ref 4.0–10.5)

## 2013-12-19 ENCOUNTER — Telehealth: Payer: Self-pay | Admitting: Internal Medicine

## 2013-12-19 ENCOUNTER — Ambulatory Visit (HOSPITAL_COMMUNITY)
Admission: RE | Admit: 2013-12-19 | Discharge: 2013-12-19 | Disposition: A | Discharge status: Home / Self Care | Payer: Medicare Other | Source: Ambulatory Visit | Attending: Internal Medicine | Admitting: Internal Medicine

## 2013-12-19 DIAGNOSIS — K4090 Unilateral inguinal hernia, without obstruction or gangrene, not specified as recurrent: Secondary | ICD-10-CM | POA: Insufficient documentation

## 2013-12-19 DIAGNOSIS — K5900 Constipation, unspecified: Secondary | ICD-10-CM | POA: Insufficient documentation

## 2013-12-19 DIAGNOSIS — K5730 Diverticulosis of large intestine without perforation or abscess without bleeding: Secondary | ICD-10-CM | POA: Diagnosis not present

## 2013-12-19 DIAGNOSIS — K4030 Unilateral inguinal hernia, with obstruction, without gangrene, not specified as recurrent: Secondary | ICD-10-CM

## 2013-12-19 NOTE — Telephone Encounter (Signed)
I called and  Reviewed labs with pt and BE results; I recommended getting a truss to keep LEFT inguinal hernia reduced - wear during upright waking; cont daily prunes and prn Linzess; he c/o fatigue- says thyroid checked by Dr fagan - ok - I suggested a centrum silver daily  Needs an ov w me just before christmas.  PLE[facility] -  THEY CAN GET HIM FITTED; NEED TO KEEP LEFT INGUINAL HERNIA REDUCED; PLEASE LET HIM KNOW ABOUT TRUSS AND APPT IN AM

## 2013-12-21 ENCOUNTER — Telehealth: Payer: Self-pay | Admitting: Internal Medicine

## 2013-12-21 NOTE — Telephone Encounter (Signed)
APPOINTMENT MADE AND PATIENT AWARE OF DATE AND TIME  °

## 2013-12-21 NOTE — Telephone Encounter (Signed)
Where should I schedule patient?  Please advise

## 2013-12-21 NOTE — Telephone Encounter (Signed)
12/22 at 4:30 with RMR

## 2013-12-21 NOTE — Telephone Encounter (Signed)
[facility], they do not need an order, pt just needs to go by there and they will fit him.   Misty StanleyStacey, if you can make him an appt I will call and let him know

## 2013-12-22 ENCOUNTER — Other Ambulatory Visit: Payer: Self-pay

## 2013-12-22 MED ORDER — LINACLOTIDE 290 MCG PO CAPS: 290.0000 ug | ORAL_CAPSULE | Freq: Every day | ORAL | Status: DC

## 2014-01-03 ENCOUNTER — Ambulatory Visit (INDEPENDENT_AMBULATORY_CARE_PROVIDER_SITE_OTHER): Payer: Medicare Other | Admitting: Internal Medicine

## 2014-01-03 ENCOUNTER — Ambulatory Visit: Payer: Self-pay | Admitting: Gastroenterology

## 2014-01-03 ENCOUNTER — Encounter: Payer: Self-pay | Admitting: Internal Medicine

## 2014-01-03 VITALS — BP 130/80 | HR 70 | Temp 96.8°F | Ht 70.0 in | Wt 172.0 lb

## 2014-01-03 DIAGNOSIS — K5909 Other constipation: Secondary | ICD-10-CM

## 2014-01-03 NOTE — Progress Notes (Signed)
Primary Care Physician:  Carylon PerchesFAGAN,ROY, MD Primary Gastroenterologist:  Dr. Jena Gaussourk  Pre-Procedure History & Physical: HPI:  James Park is a 80 y.o. male here for thought of constipation. Patient continues to complain of not getting a good core evacuation". Did not get a truss as previously recommended. Next is now even a low dose of lens as "too much". 4 Dulcolax tablets the other day and had a good bowel movement.  Reviewed the workup thus far. A barium enema failed to demonstrate an obstruction, however, of the sigmoid colon significantly "dips" into the inguinal canal. He continued continues to complain bitterly of fatigue. I explained to him that progressive anemia in a setting of progressive renal failure likely has much to do with his fatigue and there is little to be done about that short of going back on hemodialysis. Even with hemodialysis, he might not regain the energy that he has lost  Past Medical History  Diagnosis Date  . Atrial fibrillation 02/2011    Not anticoagulated  . Right bundle branch block   . Essential hypertension, benign   . Anxiety   . Depression   . Constipation   . GERD (gastroesophageal reflux disease)   . Arthritis   . Urinary retention     Self-catheterization when necessary  . Rotator cuff tear     Right - not repaired  . Stroke 05/2012    Gait disturbance only residual - hemorrhagic stroke  . CKD (chronic kidney disease) stage 5, GFR less than 15 ml/min     Previously on hemodialysis  . Pulmonary hypertension     Severe  . Chronotropic incompetence     Suspected based on GXT    Past Surgical History  Procedure Laterality Date  . Knee surgery      Tendon repair  . Eye surgery Bilateral     Cataract  . Insertion of dialysis catheter Right 06/30/2012    Procedure: INSERTION OF DIALYSIS CATHETER right IJ;  Surgeon: Larina Earthlyodd F Early, MD;  [facility];  Service: Vascular;  Laterality: Right;  . Tonsillectomy    . Appendectomy      Prior  to Admission medications   Medication Sig Start Date End Date Taking? Authorizing Provider  albuterol (PROVENTIL) (2.5 MG/3ML) 0.083% nebulizer solution Take 2.5 mg by nebulization every 6 (six) hours as needed for wheezing or shortness of breath.   Yes Historical Provider, MD  calcium acetate (PHOSLO) 667 MG capsule Take 667 mg by mouth 3 (three) times daily with meals. Pt said he only takes one tablet in the morning 05/23/13  Yes Historical Provider, MD  doxazosin (CARDURA) 1 MG tablet Take 1 mg by mouth daily.   Yes Historical Provider, MD  fluticasone (FLONASE) 50 MCG/ACT nasal spray Place 1 spray into both nostrils daily as needed for allergies or rhinitis.   Yes Historical Provider, MD  losartan (COZAAR) 50 MG tablet Take 50 mg by mouth 2 (two) times daily. 06/13/12  Yes Milana ObeyStephen D Knowlton, MD  Melatonin 5 MG TABS Take 5 mg by mouth at bedtime as needed (Sleep).   Yes Historical Provider, MD  sodium bicarbonate 650 MG tablet Take 650 mg by mouth daily.   Yes Historical Provider, MD  Linaclotide (LINZESS) 290 MCG CAPS capsule Take 1 capsule (290 mcg total) by mouth daily. Patient not taking: Reported on 01/03/2014 12/22/13   Sherril CongEric Anthony Gill, NP  lubiprostone (AMITIZA) 24 MCG capsule Take 1 capsule (24 mcg total) by mouth 2 (two)  times daily with a meal. Patient not taking: Reported on 12/14/2013 12/02/13   Tiffany KocherLeslie S Lewis, PA-C    Allergies as of 01/03/2014 - Review Complete 01/03/2014  Allergen Reaction Noted  . Altace [ramipril] Hives 06/11/2012    Family History  Problem Relation Age of Onset  . Parkinson's disease Brother   . Colon cancer Neg Hx     History   Social History  . Marital Status: Divorced    Spouse Name: N/A    Number of Children: 2  . Years of Education: N/A   Occupational History  . Not on file.   Social History Main Topics  . Smoking status: Never Smoker   . Smokeless tobacco: Not on file     Comment: quit 1967  . Alcohol Use: 1.2 oz/week    2 Cans of  beer per week     Comment: 2 cans of beer daily  . Drug Use: No  . Sexual Activity: Not on file   Other Topics Concern  . Not on file   Social History Narrative    Review of Systems: See HPI, otherwise negative ROS  Physical Exam: BP 130/80 mmHg  Pulse 70  Temp(Src) 96.8 F (36 C)  Ht 5\' 10"  (1.778 m)  Wt 172 lb (78.019 kg)  BMI 24.68 kg/m2 General:   Alert,  Elderly, gaunt -appearing gentleman resting comfortably in no acute distress. Skin:  Intact without significant lesions or rashes. Eyes:  Sclera clear, no icterus.   Conjunctiva pink. Ears:  Normal auditory acuity. Nose:  No deformity, discharge,  or lesions. Mouth:  No deformity or lesions. Neck:  Supple; no masses or thyromegaly. No significant cervical adenopathy. Lungs:  Clear throughout to auscultation.   No wheezes, crackles, or rhonchi. No acute distress. Heart:  Regular rate and rhythm; no murmurs, clicks, rubs,  or gallops. Abdomen: Non-distended, normal bowel sounds.  Soft and nontender without appreciable mass or hepatosplenomegaly. Inguinal hernia reduced today. Pulses:  Normal pulses noted. Extremities:  Without clubbing or edema.  Impression:     Get a Truss and try it for 2 weeks  Take 2 Ducolax tablets daily (bedtime)  2 prunes in the morning with breakfast  Office visit in 2-3 weeks   Notice: This dictation was prepared with Dragon dictation along with smaller phrase technology. Any transcriptional errors that result from this process are unintentional and may not be corrected upon review.

## 2014-01-03 NOTE — Patient Instructions (Signed)
Get a Truss and try it for 2 weeks  Take 2 Ducolax tablets daily (bedtime)  2 prunes in the morning with breakfast  Office visit in 2-3 weeks

## 2014-01-10 ENCOUNTER — Ambulatory Visit: Payer: Self-pay | Admitting: Internal Medicine

## 2014-01-17 ENCOUNTER — Ambulatory Visit: Payer: Self-pay | Admitting: Internal Medicine

## 2014-02-21 ENCOUNTER — Ambulatory Visit (INDEPENDENT_AMBULATORY_CARE_PROVIDER_SITE_OTHER): Payer: Self-pay | Admitting: Internal Medicine

## 2014-02-21 ENCOUNTER — Encounter: Payer: Self-pay | Admitting: Internal Medicine

## 2014-02-21 VITALS — BP 125/76 | HR 75 | Temp 97.9°F | Ht 70.0 in | Wt 175.6 lb

## 2014-02-21 DIAGNOSIS — K5909 Other constipation: Secondary | ICD-10-CM

## 2014-02-21 NOTE — Patient Instructions (Signed)
See Dr. Ouida SillsFagan regarding ear, nose and throat complaints  Would continue dietary modification which incorporates adequate vegetables in the diet daily  May continue using Ducolax tablets as needed  Follow-up with me on an as-needed basis.

## 2014-02-21 NOTE — Progress Notes (Signed)
Primary Care Physician:  James Park,ROY, MD Primary Gastroenterologist:  Dr. Jena Gaussourk  Pre-Procedure History & Physical: HPI:  James Park is a 81 y.o. male here for followup of constipation.  States significant hearing diminution with sinus congestion since this past weekend. Also has a raspy voice. States he's going to see Dr. Ouida SillsFagan after he sees me today. States he almost canceled the appointment today as he figured out what he has not been doing in his diet. States all his life he has not eat any vegetables. He read about broccoli and carrots facilitating bowel function. He is now gotten into the habit of [place] [employer] and eating broccoli and carrots once daily. He states with this dietary modification, he is having a bowel movement almost every day. He notes he's never in his life eaten vegetables until recently. If he goes 3 days without a bowel movement, he takes Duccolax tablets with good results. He's not taking any prunes. He's not taking any MiraLax. He never pursued getting a truss as he stated he would not wear.  Past Medical History  Diagnosis Date  . Atrial fibrillation 02/2011    Not anticoagulated  . Right bundle branch block   . Essential hypertension, benign   . Anxiety   . Depression   . Constipation   . GERD (gastroesophageal reflux disease)   . Arthritis   . Urinary retention     Self-catheterization when necessary  . Rotator cuff tear     Right - not repaired  . Stroke 05/2012    Gait disturbance only residual - hemorrhagic stroke  . CKD (chronic kidney disease) stage 5, GFR less than 15 ml/min     Previously on hemodialysis  . Pulmonary hypertension     Severe  . Chronotropic incompetence     Suspected based on GXT    Past Surgical History  Procedure Laterality Date  . Knee surgery      Tendon repair  . Eye surgery Bilateral     Cataract  . Insertion of dialysis catheter Right 06/30/2012    Procedure: INSERTION OF DIALYSIS CATHETER right  IJ;  Surgeon: Larina Earthlyodd F Early, MD;  [facility];  Service: Vascular;  Laterality: Right;  . Tonsillectomy    . Appendectomy      Prior to Admission medications   Medication Sig Start Date End Date Taking? Authorizing Provider  albuterol (PROVENTIL) (2.5 MG/3ML) 0.083% nebulizer solution Take 2.5 mg by nebulization every 6 (six) hours as needed for wheezing or shortness of breath.   Yes Historical Provider, MD  calcium acetate (PHOSLO) 667 MG capsule Take 667 mg by mouth 3 (three) times daily with meals. Pt said he only takes one tablet in the morning 05/23/13  Yes Historical Provider, MD  doxazosin (CARDURA) 1 MG tablet Take 1 mg by mouth daily.   Yes Historical Provider, MD  fluticasone (FLONASE) 50 MCG/ACT nasal spray Place 1 spray into both nostrils daily as needed for allergies or rhinitis.   Yes Historical Provider, MD  losartan (COZAAR) 50 MG tablet Take 50 mg by mouth 2 (two) times daily. 06/13/12  Yes Milana ObeyStephen D Knowlton, MD  Melatonin 5 MG TABS Take 5 mg by mouth at bedtime as needed (Sleep).   Yes Historical Provider, MD  sodium bicarbonate 650 MG tablet Take 650 mg by mouth daily.   Yes Historical Provider, MD  Linaclotide (LINZESS) 290 MCG CAPS capsule Take 1 capsule (290 mcg total) by mouth daily. Patient not  taking: Reported on 02/21/2014 12/22/13   Sherril Cong, NP  lubiprostone (AMITIZA) 24 MCG capsule Take 1 capsule (24 mcg total) by mouth 2 (two) times daily with a meal. Patient not taking: Reported on 12/14/2013 12/02/13   Tiffany Kocher, PA-C    Allergies as of 02/21/2014 - Review Complete 02/21/2014  Allergen Reaction Noted  . Altace [ramipril] Hives 06/11/2012    Family History  Problem Relation Age of Onset  . Parkinson's disease Brother   . Colon cancer Neg Hx     History   Social History  . Marital Status: Divorced    Spouse Name: N/A    Number of Children: 2  . Years of Education: N/A   Occupational History  . Not on file.   Social History Main  Topics  . Smoking status: Never Smoker   . Smokeless tobacco: Not on file     Comment: quit 1967  . Alcohol Use: 1.2 oz/week    2 Cans of beer per week     Comment: 2 cans of beer daily  . Drug Use: No  . Sexual Activity: Not on file   Other Topics Concern  . Not on file   Social History Narrative    Review of Systems: See HPI, otherwise negative ROS  Physical Exam: BP 125/76 mmHg  Pulse 75  Temp(Src) 97.9 F (36.6 C) (Oral)  Ht  (1.778 m)  Wt 175 lb 9.6 oz (79.652 kg)  BMI 25.20 kg/m2 General:   Elderly gentleman; pleasant and cooperative in NAD. Somewhat gaunt appearing. Skin:  Intact without significant lesions or rashes. Eyes:  Sclera clear, no icterus.   Conjunctiva pink. Neck:  Supple; no masses or thyromegaly. No significant cervical adenopathy. Abdomen: Non-distended, normal bowel sounds.  Soft and nontender without appreciable mass or hepatosplenomegaly.  Pulses:  Normal pulses noted. Extremities:  Without clubbing or edema.  Impression: 81 year old gentleman with multiple medical problems and ongoing constipation. More recently, patient tells me he's doing much better incorporated and vegetables into his daily diet-something that he has not done previously in his life. He has not really followed much of any of my recommendations.However, he seems to have been something which seems to work for him. The patient has ENT complaints which are best addressed by Dr. Ouida Sills.   Recommendations: See Dr. Ouida Sills regarding ear, nose and throat complaints  Would continue dietary modification which incorporates adequate vegetables in the diet daily  May continue using Ducolax tablets as needed  Follow-up with me on an as-needed basis.   Notice: This dictation was prepared with Dragon dictation along with smaller phrase technology. Any transcriptional errors that result from this process are unintentional and may not be corrected upon review.

## 2014-03-09 ENCOUNTER — Ambulatory Visit (INDEPENDENT_AMBULATORY_CARE_PROVIDER_SITE_OTHER): Payer: Medicare Other | Admitting: Otolaryngology

## 2014-03-09 DIAGNOSIS — H900 Conductive hearing loss, bilateral: Secondary | ICD-10-CM | POA: Diagnosis not present

## 2014-03-09 DIAGNOSIS — J382 Nodules of vocal cords: Secondary | ICD-10-CM | POA: Diagnosis not present

## 2014-03-09 DIAGNOSIS — R490 Dysphonia: Secondary | ICD-10-CM

## 2014-03-09 DIAGNOSIS — H6123 Impacted cerumen, bilateral: Secondary | ICD-10-CM

## 2014-05-24 ENCOUNTER — Ambulatory Visit (HOSPITAL_COMMUNITY)
Admission: RE | Admit: 2014-05-24 | Discharge: 2014-05-24 | Disposition: A | Discharge status: Home / Self Care | Payer: Medicare Other | Source: Ambulatory Visit | Attending: Internal Medicine | Admitting: Internal Medicine

## 2014-05-24 ENCOUNTER — Other Ambulatory Visit (HOSPITAL_COMMUNITY): Payer: Self-pay | Admitting: Internal Medicine

## 2014-05-24 DIAGNOSIS — R059 Cough, unspecified: Secondary | ICD-10-CM

## 2014-05-24 DIAGNOSIS — R05 Cough: Secondary | ICD-10-CM | POA: Insufficient documentation

## 2014-05-24 DIAGNOSIS — R0989 Other specified symptoms and signs involving the circulatory and respiratory systems: Secondary | ICD-10-CM | POA: Diagnosis not present

## 2014-05-24 DIAGNOSIS — R062 Wheezing: Secondary | ICD-10-CM | POA: Diagnosis not present

## 2014-06-06 ENCOUNTER — Ambulatory Visit (HOSPITAL_COMMUNITY): Payer: Medicare Other | Attending: Family Medicine | Admitting: Physical Therapy

## 2014-06-06 ENCOUNTER — Encounter (HOSPITAL_COMMUNITY): Payer: Self-pay | Admitting: Physical Therapy

## 2014-06-06 DIAGNOSIS — Z9181 History of falling: Secondary | ICD-10-CM | POA: Diagnosis not present

## 2014-06-06 DIAGNOSIS — R293 Abnormal posture: Secondary | ICD-10-CM

## 2014-06-06 DIAGNOSIS — M6281 Muscle weakness (generalized): Secondary | ICD-10-CM

## 2014-06-06 DIAGNOSIS — R269 Unspecified abnormalities of gait and mobility: Secondary | ICD-10-CM

## 2014-06-06 DIAGNOSIS — Z7409 Other reduced mobility: Secondary | ICD-10-CM | POA: Diagnosis not present

## 2014-06-06 DIAGNOSIS — R2689 Other abnormalities of gait and mobility: Secondary | ICD-10-CM

## 2014-06-06 DIAGNOSIS — M6289 Other specified disorders of muscle: Secondary | ICD-10-CM | POA: Diagnosis not present

## 2014-06-06 NOTE — Therapy (Signed)
[facility] [address] [place], [place], [address] Phone: [phone]   Fax:  [phone]  Physical Therapy Evaluation  Patient Details  Name: James Park MRN: [id number] Date of Birth: March 02, 1929 Referring Provider:  Eartha Inch, MD  Encounter Date: 06/06/2014      PT End of Session - 06/06/14 1616    Visit Number 1   Number of Visits 16   Date for PT Re-Evaluation 07/04/14   Authorization Type BCBS Medicare Advantage    Authorization Time Period 06/06/14 to 08/06/14   Authorization - Visit Number 1   Authorization - Number of Visits 10   PT Start Time 1336   PT Stop Time 1424   PT Time Calculation (min) 48 min   Activity Tolerance Patient tolerated treatment well   Behavior During Therapy [facility] for tasks assessed/performed      Past Medical History  Diagnosis Date  . Atrial fibrillation 02/2011    Not anticoagulated  . Right bundle branch block   . Essential hypertension, benign   . Anxiety   . Depression   . Constipation   . GERD (gastroesophageal reflux disease)   . Arthritis   . Urinary retention     Self-catheterization when necessary  . Rotator cuff tear     Right - not repaired  . Stroke 05/2012    Gait disturbance only residual - hemorrhagic stroke  . CKD (chronic kidney disease) stage 5, GFR less than 15 ml/min     Previously on hemodialysis  . Pulmonary hypertension     Severe  . Chronotropic incompetence     Suspected based on GXT    Past Surgical History  Procedure Laterality Date  . Knee surgery      Tendon repair  . Eye surgery Bilateral     Cataract  . Insertion of dialysis catheter Right 06/30/2012    Procedure: INSERTION OF DIALYSIS CATHETER right IJ;  Surgeon: Larina Earthly, MD;  Location: [facility] OR;  Service: Vascular;  Laterality: Right;  . Tonsillectomy    . Appendectomy      There were no vitals filed for this visit.  Visit Diagnosis:  History of fall - Plan: PT plan of care  cert/re-cert  Poor balance - Plan: PT plan of care cert/re-cert  Poor posture - Plan: PT plan of care cert/re-cert  Proximal muscle weakness - Plan: PT plan of care cert/re-cert  Abnormality of gait - Plan: PT plan of care cert/re-cert  Impaired functional mobility and activity tolerance - Plan: PT plan of care cert/re-cert      Subjective Assessment - 06/06/14 1340    Subjective Patient states that on an average day, he does feel off balance and has increased trouble getting around. States he is very concened about falling. Notices he drags his feet a lot. States he does not furniture walk.    Pertinent History Patient had a fall recently, about 5 weeks ago; he was trying to get something out of his barn and got his feet tangled up in an electrical cord. No fractures. this was the first big fall he has had recently.    Patient Stated Goals better balance    Currently in Pain? No/denies            [facility] PT Assessment - 06/06/14 0001    Assessment   Medical Diagnosis recent fall    Onset Date/Surgical Date --  about 5 weeks ago    Next MD Visit  June 16th, 2016   Precautions   Precautions None   Restrictions   Weight Bearing Restrictions No   Balance Screen   Has the patient fallen in the past 6 months Yes   How many times? 1   Has the patient had a decrease in activity level because of a fear of falling?  Yes   Is the patient reluctant to leave their home because of a fear of falling?  Yes   Prior Function   Level of Independence Independent with basic ADLs;Independent with gait;Independent with transfers   Vocation Retired   Leisure no hobbies    Observation/Other Assessments   Observations 6 minute walk approx 492ft, one extended standing rest break    Focus on Therapeutic Outcomes (FOTO)  50% limited    Posture/Postural Control   Posture Comments forward head, flat spinal curves, rounded shoulders   AROM   Right Hip External Rotation  40   Right Hip Internal  Rotation  21   Left Hip External Rotation  40   Left Hip Internal Rotation  22   Right Ankle Dorsiflexion 18   Left Ankle Dorsiflexion 16   Strength   Right Hip Flexion 3/5   Right Hip ABduction 3-/5   Left Hip Flexion 3/5   Left Hip ABduction 2/5   Right Knee Flexion 4-/5   Right Knee Extension 4/5   Left Knee Flexion 3/5   Left Knee Extension 3+/5   Right Ankle Dorsiflexion 5/5   Left Ankle Dorsiflexion 5/5   Ambulation/Gait   Gait Comments very rigid trunk and hips with minimal arm swing, reduced step lengths/stance time, knee flexion in  stance, reduced ankle DF, possible slight varus knees    Berg Balance Test   Sit to Stand Able to stand  independently using hands   Standing Unsupported Able to stand safely 2 minutes   Sitting with Back Unsupported but Feet Supported on Floor or Stool Able to sit safely and securely 2 minutes   Stand to Sit Controls descent by using hands   Transfers Able to transfer safely, minor use of hands   Standing Unsupported with Eyes Closed Able to stand 10 seconds with supervision   Standing Ubsupported with Feet Together Able to place feet together independently but unable to hold for 30 seconds   From Standing, Reach Forward with Outstretched Arm Can reach forward >12 cm safely (5")   From Standing Position, Pick up Object from Floor Able to pick up shoe, needs supervision   From Standing Position, Turn to Look Behind Over each Shoulder Looks behind one side only/other side shows less weight shift   Turn 360 Degrees Able to turn 360 degrees safely but slowly   Standing Unsupported, Alternately Place Feet on Step/Stool Needs assistance to keep from falling or unable to try   Standing Unsupported, One Foot in [employer] balance while stepping or standing   Standing on One Leg Unable to try or needs assist to prevent fall   Total Score 34                           PT Education - 06/06/14 1616    Education provided Yes    Education Details prognosis, plan of care moving forward, HEP    Person(s) Educated Patient   Methods Explanation;Handout   Comprehension Verbalized understanding;Need further instruction  refused to return demonstration, stating "I'm pretty sure I've got it"  PT Short Term Goals - 06/06/14 1624    PT SHORT TERM GOAL #1   Title Patient will demonstrate a score of at least 42 on Berg balance test in order to reduce overall fall risk    Time 4   Period Weeks   Status New   PT SHORT TERM GOAL #2   Title Patient will demonstrate at least 4-/5 proximal muscle strength and 5/5 strength in bilateral lower extremities    Time 4   Period Weeks   Status New   PT SHORT TERM GOAL #3   Title Patient will demonstrate at least 30 degrees bilateral hip IR and 45 degrees bilateral hip ER    Time 4   Period Weeks   Status New   PT SHORT TERM GOAL #4   Title Patient will demonstrate improved functional activity tolerance as evidenced by an ability to ambulate at least 654ft during 6 minute walk test with no assistive device    Time 4   Period Weeks   Status New           PT Long Term Goals - 06/06/14 1626    PT LONG TERM GOAL #1   Title Patient will be independent in correctly and consistently performing appropriate HEP    Time 8   Period Weeks   Status New   PT LONG TERM GOAL #2   Title Patient will demonstrate reduced fall risk as evidenced by an increase on Berg balance scale to at least 48 points    Time 8   Period Weeks   Status New   PT LONG TERM GOAL #3   Title Patient will demonstrate improved gait mechanics, including improved pelvic and trunk rotation, symmetrical bilateral arm swing, improved step lengths with improved TKE, improved foot clearance during gait, and improved gait speed    Time 8   Period Weeks   Status New   PT LONG TERM GOAL #4   Title Patient will demonstrate improved functional activity tolerance as evidenced by an ability to ambulate at least  872ft during 6 minute walk test with no fatigue and no assistive device    Time 8   Period Weeks   Status New   PT LONG TERM GOAL #5   Title Patient will demonstrate improved balance reaction skills and safety awareness as evidenced by successful navigation of obstacle courses on even and uneven surfaces and only supervision from therapist    Time 8   Period Weeks   Status New   Additional Long Term Goals   Additional Long Term Goals Yes   PT LONG TERM GOAL #6   Title Patient to report that he has experienced no falls in or outside of his home within the past 8 weeks    Time 8   Period Weeks   Status New               Plan - 06/06/14 1618    Clinical Impression Statement Patient demonstrates significant weakness in proximal muscle groups, is a high fall risk with Berg score of 34, significant postural and gait impairments, significant reductions in functional activity tolerance, impaired balance and balance reaction skills, and overall presents with reduced functional task performance skills. The patient states that he does have a very high concern about falling at home at this time; he is also concerned that he gets fatigued so easily. Patient overall has a very stiff and ridgid presentation and movement pattern with poor shock absorption and  limited balance reaction skills, very high fall risk Patient will benefit from skilled PT services in order to address these deficits, assist him in reaching an optimal level of function, and reduce overall fall risk.    Pt will benefit from skilled therapeutic intervention in order to improve on the following deficits Abnormal gait;Decreased coordination;Decreased range of motion;Difficulty walking;Decreased safety awareness;Decreased activity tolerance;Decreased balance;Hypomobility;Impaired flexibility;Improper body mechanics;Decreased mobility;Decreased strength;Postural dysfunction   Rehab Potential Good   PT Frequency 2x / week   PT  Duration 8 weeks   PT Treatment/Interventions ADLs/Self Care Home Management;Moist Heat;Gait training;Stair training;Functional mobility training;Therapeutic activities;Therapeutic exercise;Balance training;Neuromuscular re-education;Patient/family education;Manual techniques;Energy conservation   PT Next Visit Plan Review goals and HEP; functional strengthening and stretching; balance training; hip mobility    PT Home Exercise Plan given    Consulted and Agree with Plan of Care Patient          G-Codes - 06/06/14 1634    Functional Assessment Tool Used FOTO    Functional Limitation Mobility: Walking and moving around   Mobility: Walking and Moving Around Current Status [phone]([id number]) At least 40 percent but less than 60 percent impaired, limited or restricted   Mobility: Walking and Moving Around Goal Status [phone]([id number]) At least 20 percent but less than 40 percent impaired, limited or restricted       Problem List Patient Active Problem List   Diagnosis Date Noted  . Constipation 11/30/2013  . Loss of weight 11/30/2013  . Abnormal CT scan, colon 11/30/2013  . Pulmonary hypertension 05/20/2013  . SSS (sick sinus syndrome) 05/06/2013  . CKD (chronic kidney disease) stage 5, GFR less than 15 ml/min 05/06/2013  . Essential hypertension, benign 06/11/2012  . Right bundle branch block   . Atrial fibrillation 02/14/2011    Nedra HaiKristen Destry Bezdek PT, DPT [phone]  [facility] [address] [place], [place], [address] Phone: [phone]   Fax:  [phone]

## 2014-06-06 NOTE — Patient Instructions (Signed)
Alternating Step   Holding onto the kitchen counter or the back of a chair,take alternating steps as quickly as possible; try to do this for one minute straight.    Do _2___ sessions per day.  http://gt2.exer.us/493   Copyright  VHI. All rights reserved.   ABDUCTION: Standing (Active)   Hold onto kitchen counter. Stand, feet flat. Lift right leg out to side. Use __0_ lbs. Complete _1__ sets of _10__ repetitions. Perform __2_ sessions per day.  http://gtsc.exer.us/110     Copyright  VHI. All rights reserved.   EXTENSION: Standing (Active)   Hold onto kitchen counter. Stand, both feet flat. Draw right leg behind body as far as possible. Use _0__ lbs. Complete __1_ sets of __10_ repetitions. Perform _2__ sessions per day.  http://gtsc.exer.us/76   Copyright  VHI. All rights reserved.   Functional Quadriceps: Sit to Stand   Sit on edge of chair, feet flat on floor. Stand upright, extending knees fully. Repeat _5-10___ times per set. Do __1__ sets per session. Do _2___ sessions per day.  http://orth.exer.us/734   Copyright  VHI. All rights reserved.

## 2014-06-08 ENCOUNTER — Ambulatory Visit (HOSPITAL_COMMUNITY): Payer: Medicare Other

## 2014-06-08 DIAGNOSIS — R269 Unspecified abnormalities of gait and mobility: Secondary | ICD-10-CM

## 2014-06-08 DIAGNOSIS — M6281 Muscle weakness (generalized): Secondary | ICD-10-CM

## 2014-06-08 DIAGNOSIS — R293 Abnormal posture: Secondary | ICD-10-CM

## 2014-06-08 DIAGNOSIS — Z9181 History of falling: Secondary | ICD-10-CM

## 2014-06-08 DIAGNOSIS — Z7409 Other reduced mobility: Secondary | ICD-10-CM

## 2014-06-08 DIAGNOSIS — R2689 Other abnormalities of gait and mobility: Secondary | ICD-10-CM

## 2014-06-08 NOTE — Therapy (Signed)
[facility] [address] [place], [place], [address] Phone: [phone]   Fax:  [phone]  Physical Therapy Treatment  Patient Details  Name: James Park MRN: [id number] Date of Birth: 1929-12-02 Referring Provider:  Carylon Perches, MD  Encounter Date: 06/08/2014      PT End of Session - 06/08/14 1412    Visit Number 2   Number of Visits 16   Date for PT Re-Evaluation 07/04/14   Authorization Type BCBS Medicare Advantage    Authorization Time Period 06/06/14 to 08/06/14   Authorization - Visit Number 2   Authorization - Number of Visits 10   PT Start Time 1348   PT Stop Time 1433   PT Time Calculation (min) 45 min   Activity Tolerance Patient tolerated treatment well   Behavior During Therapy [facility] for tasks assessed/performed      Past Medical History  Diagnosis Date  . Atrial fibrillation 02/2011    Not anticoagulated  . Right bundle branch block   . Essential hypertension, benign   . Anxiety   . Depression   . Constipation   . GERD (gastroesophageal reflux disease)   . Arthritis   . Urinary retention     Self-catheterization when necessary  . Rotator cuff tear     Right - not repaired  . Stroke 05/2012    Gait disturbance only residual - hemorrhagic stroke  . CKD (chronic kidney disease) stage 5, GFR less than 15 ml/min     Previously on hemodialysis  . Pulmonary hypertension     Severe  . Chronotropic incompetence     Suspected based on GXT    Past Surgical History  Procedure Laterality Date  . Knee surgery      Tendon repair  . Eye surgery Bilateral     Cataract  . Insertion of dialysis catheter Right 06/30/2012    Procedure: INSERTION OF DIALYSIS CATHETER right IJ;  Surgeon: Larina Earthly, MD;  Location: [facility] OR;  Service: Vascular;  Laterality: Right;  . Tonsillectomy    . Appendectomy      There were no vitals filed for this visit.  Visit Diagnosis:  History of fall  Poor balance  Poor  posture  Proximal muscle weakness  Abnormality of gait  Impaired functional mobility and activity tolerance      Subjective Assessment - 06/08/14 1359    Subjective Pt stated no pain, has been compliant with HEP and has had no falls for 5 weeks now.   Currently in Pain? No/denies              [facility] Adult PT Treatment/Exercise - 06/08/14 0001    Exercises   Exercises Knee/Hip   Knee/Hip Exercises: Stretches   Hip Flexor Stretch 2 reps;30 seconds   Hip Flexor Stretch Limitations 14in step   Knee/Hip Exercises: Standing   Heel Raises 10 reps   Functional Squat 10 reps   Gait Training 2x 26feet with cueing to increase stride length   Other Standing Knee Exercises 3D hip excursion 10x   Other Standing Knee Exercises Hip abduction and extension 10x each; sidestepping 1RT; Marching with HHA 10x5" Bil LE alternating   Knee/Hip Exercises: Supine   Bridges 10 reps             PT Short Term Goals - 06/08/14 1412    PT SHORT TERM GOAL #1   Title Patient will demonstrate a score of at least 42 on Berg balance test in  order to reduce overall fall risk    Status On-going   PT SHORT TERM GOAL #2   Title Patient will demonstrate at least 4-/5 proximal muscle strength and 5/5 strength in bilateral lower extremities    Status On-going   PT SHORT TERM GOAL #3   Title Patient will demonstrate at least 30 degrees bilateral hip IR and 45 degrees bilateral hip ER    Status On-going   PT SHORT TERM GOAL #4   Title Patient will demonstrate improved functional activity tolerance as evidenced by an ability to ambulate at least 64850ft during 6 minute walk test with no assistive device    Status On-going           PT Long Term Goals - 06/08/14 1412    PT LONG TERM GOAL #1   Title Patient will be independent in correctly and consistently performing appropriate HEP    PT LONG TERM GOAL #2   Title Patient will demonstrate reduced fall risk as evidenced by an increase on Berg balance  scale to at least 48 points    PT LONG TERM GOAL #3   Title Patient will demonstrate improved gait mechanics, including improved pelvic and trunk rotation, symmetrical bilateral arm swing, improved step lengths with improved TKE, improved foot clearance during gait, and improved gait speed    PT LONG TERM GOAL #4   Title Patient will demonstrate improved functional activity tolerance as evidenced by an ability to ambulate at least 83150ft during 6 minute walk test with no fatigue and no assistive device    PT LONG TERM GOAL #5   Title Patient will demonstrate improved balance reaction skills and safety awareness as evidenced by successful navigation of obstacle courses on even and uneven surfaces and only supervision from therapist    PT LONG TERM GOAL #6   Title Patient to report that he has experienced no falls in or outside of his home within the past 8 weeks                Plan - 06/08/14 1408    Clinical Impression Statement Reviewed goals, compliance and assurance for proper form with HEP and copy of evaluation given to pt.  Progressed hip mobility and gluteal strenghtening with 3D hip excursion with therapist facilitation for proper form and weight bearing with squats.  Added hip flexor stretches to improve hip extension with gait and cueing with gait training to increase stride length for more normalized gait mechanics.  Pt limited by fatgiue at end of session, required 2 rest breaks through sessio.  No reports of pain.     PT Next Visit Plan Continue functional strengthening and stretching; balance training; hip mobility.  Next session begin lunges on 6in step for LE strengthening, add theraband for sidestepping, begin retro gait and tandem stance, Nustep to improve activity tolerance, maybe piriformis stretch if cueing required to reduce ER with gait.          Problem List Patient Active Problem List   Diagnosis Date Noted  . Constipation 11/30/2013  . Loss of weight 11/30/2013   . Abnormal CT scan, colon 11/30/2013  . Pulmonary hypertension 05/20/2013  . SSS (sick sinus syndrome) 05/06/2013  . CKD (chronic kidney disease) stage 5, GFR less than 15 ml/min 05/06/2013  . Essential hypertension, benign 06/11/2012  . Right bundle branch block   . Atrial fibrillation 02/14/2011   Juel Burrowasey Jo Demontrez Rindfleisch, PTA  Juel BurrowCockerham, Leeandra Ellerson Jo 06/08/2014, 2:50 PM   [facility]  [place] [address] [place], [place], [address] Phone: [phone]   Fax:  [phone]

## 2014-06-09 ENCOUNTER — Other Ambulatory Visit: Payer: Self-pay

## 2014-06-14 ENCOUNTER — Ambulatory Visit: Payer: Self-pay | Admitting: Internal Medicine

## 2014-06-15 ENCOUNTER — Ambulatory Visit (HOSPITAL_COMMUNITY): Payer: Medicare Other | Attending: Family Medicine | Admitting: Physical Therapy

## 2014-06-15 DIAGNOSIS — Z7409 Other reduced mobility: Secondary | ICD-10-CM

## 2014-06-15 DIAGNOSIS — R2689 Other abnormalities of gait and mobility: Secondary | ICD-10-CM | POA: Diagnosis not present

## 2014-06-15 DIAGNOSIS — Z9181 History of falling: Secondary | ICD-10-CM | POA: Diagnosis not present

## 2014-06-15 DIAGNOSIS — R293 Abnormal posture: Secondary | ICD-10-CM

## 2014-06-15 DIAGNOSIS — R269 Unspecified abnormalities of gait and mobility: Secondary | ICD-10-CM | POA: Diagnosis not present

## 2014-06-15 DIAGNOSIS — M6289 Other specified disorders of muscle: Secondary | ICD-10-CM | POA: Diagnosis not present

## 2014-06-15 DIAGNOSIS — M6281 Muscle weakness (generalized): Secondary | ICD-10-CM

## 2014-06-15 NOTE — Therapy (Signed)
[facility] [address] [place], [place], [address] Phone: [phone]   Fax:  [phone]  Physical Therapy Treatment  Patient Details  Name: James Park MRN: [id number] Date of Birth: [birth year] Referring Provider:  Eartha Inch, MD  Encounter Date: 06/15/2014      PT End of Session - 06/15/14 1753    Visit Number 2   Number of Visits 16   Date for PT Re-Evaluation 07/04/14   Authorization Type BCBS Medicare Advantage    Authorization Time Period 06/06/14 to 08/06/14   Authorization - Visit Number 2   Authorization - Number of Visits 10   PT Start Time 1350   PT Stop Time 1434   PT Time Calculation (min) 44 min   Activity Tolerance Patient tolerated treatment well   Behavior During Therapy [facility] for tasks assessed/performed      Past Medical History  Diagnosis Date  . Atrial fibrillation 02/2011    Not anticoagulated  . Right bundle branch block   . Essential hypertension, benign   . Anxiety   . Depression   . Constipation   . GERD (gastroesophageal reflux disease)   . Arthritis   . Urinary retention     Self-catheterization when necessary  . Rotator cuff tear     Right - not repaired  . Stroke 05/2012    Gait disturbance only residual - hemorrhagic stroke  . CKD (chronic kidney disease) stage 5, GFR less than 15 ml/min     Previously on hemodialysis  . Pulmonary hypertension     Severe  . Chronotropic incompetence     Suspected based on GXT    Past Surgical History  Procedure Laterality Date  . Knee surgery      Tendon repair  . Eye surgery Bilateral     Cataract  . Insertion of dialysis catheter Right 06/30/2012    Procedure: INSERTION OF DIALYSIS CATHETER right IJ;  Surgeon: Larina Earthly, MD;  Location: [facility] OR;  Service: Vascular;  Laterality: Right;  . Tonsillectomy    . Appendectomy      There were no vitals filed for this visit.  Visit Diagnosis:  History of fall  Poor balance  Poor  posture  Proximal muscle weakness  Abnormality of gait  Impaired functional mobility and activity tolerance      Subjective Assessment - 06/15/14 1742    Subjective Pt comes with caregiver today and walking with front wheeled walker.  Reports he fell on Friday and was not discovered until Saturday by his daughter. Pt with large contusion on rt side of forehead and states his hips and shoulders are sore.  Pt reports he did not go to ED or MD.    Currently in Pain? No/denies                         [facility] Adult PT Treatment/Exercise - 06/15/14 1401    Knee/Hip Exercises: Standing   Heel Raises 10 reps   Forward Lunges Both;10 reps   Forward Lunges Limitations to 4" box without UE assist   Lateral Step Up Both;10 reps;Step Height: 4";Hand Hold: 1   Forward Step Up Both;10 reps;Step Height: 4";Hand Hold: 1   Functional Squat 10 reps   Gait Training 2x 231feet with cueing to increase stride length   Other Standing Knee Exercises retro gait 1RT   Other Standing Knee Exercises Hip abduction and extension 10x each; sidestepping 1RT; Marching with  HHA 10x5" Bil LE alternating   Knee/Hip Exercises: Seated   Other Seated Knee Exercises sit to stand avg chair 5 reps without UE's                  PT Short Term Goals - 06/08/14 1412    PT SHORT TERM GOAL #1   Title Patient will demonstrate a score of at least 42 on Berg balance test in order to reduce overall fall risk    Status On-going   PT SHORT TERM GOAL #2   Title Patient will demonstrate at least 4-/5 proximal muscle strength and 5/5 strength in bilateral lower extremities    Status On-going   PT SHORT TERM GOAL #3   Title Patient will demonstrate at least 30 degrees bilateral hip IR and 45 degrees bilateral hip ER    Status On-going   PT SHORT TERM GOAL #4   Title Patient will demonstrate improved functional activity tolerance as evidenced by an ability to ambulate at least 6550ft during 6 minute walk test  with no assistive device    Status On-going           PT Long Term Goals - 06/08/14 1412    PT LONG TERM GOAL #1   Title Patient will be independent in correctly and consistently performing appropriate HEP    PT LONG TERM GOAL #2   Title Patient will demonstrate reduced fall risk as evidenced by an increase on Berg balance scale to at least 48 points    PT LONG TERM GOAL #3   Title Patient will demonstrate improved gait mechanics, including improved pelvic and trunk rotation, symmetrical bilateral arm swing, improved step lengths with improved TKE, improved foot clearance during gait, and improved gait speed    PT LONG TERM GOAL #4   Title Patient will demonstrate improved functional activity tolerance as evidenced by an ability to ambulate at least 87250ft during 6 minute walk test with no fatigue and no assistive device    PT LONG TERM GOAL #5   Title Patient will demonstrate improved balance reaction skills and safety awareness as evidenced by successful navigation of obstacle courses on even and uneven surfaces and only supervision from therapist    PT LONG TERM GOAL #6   Title Patient to report that he has experienced no falls in or outside of his home within the past 8 weeks                Plan - 06/15/14 1757    Clinical Impression Statement Attempted to increase height of walker, however would not go any highter.  Suggested to patient we send an order to MD for new walker, howevr pateitn stated he would not be using it long and it was no use. Progressed with step ups, lunges, sit to stands and retro gait today . PT was able to complete all exericses without increased c/o pain, discomfort or distress.  Pt did require frequent rest breakds due to poor actvitiy tolerance.     PT Next Visit Plan Continue functional strengthening and stretching; balance training; hip mobility.  Next session begin tandem stance and Nustep to improve activity tolerance. Progress therex as able.           Problem List Patient Active Problem List   Diagnosis Date Noted  . Constipation 11/30/2013  . Loss of weight 11/30/2013  . Abnormal CT scan, colon 11/30/2013  . Pulmonary hypertension 05/20/2013  . SSS (sick sinus syndrome) 05/06/2013  .  CKD (chronic kidney disease) stage 5, GFR less than 15 ml/min 05/06/2013  . Essential hypertension, benign 06/11/2012  . Right bundle branch block   . Atrial fibrillation 02/14/2011    Lurena Nida, PTA/CLT [phone]  06/15/2014, 6:00 PM  [facility] [address] [place], [place], [address] Phone: [phone]   Fax:  [phone]

## 2014-06-20 ENCOUNTER — Ambulatory Visit (HOSPITAL_COMMUNITY): Payer: Medicare Other

## 2014-06-22 ENCOUNTER — Ambulatory Visit (HOSPITAL_COMMUNITY): Payer: Medicare Other | Admitting: Physical Therapy

## 2014-06-22 DIAGNOSIS — M6281 Muscle weakness (generalized): Secondary | ICD-10-CM

## 2014-06-22 DIAGNOSIS — R269 Unspecified abnormalities of gait and mobility: Secondary | ICD-10-CM

## 2014-06-22 DIAGNOSIS — R2689 Other abnormalities of gait and mobility: Secondary | ICD-10-CM

## 2014-06-22 DIAGNOSIS — Z7409 Other reduced mobility: Secondary | ICD-10-CM

## 2014-06-22 DIAGNOSIS — Z9181 History of falling: Secondary | ICD-10-CM

## 2014-06-22 DIAGNOSIS — R293 Abnormal posture: Secondary | ICD-10-CM

## 2014-06-22 NOTE — Therapy (Signed)
[facility] [address] [place], [place], [address] Phone: [phone]   Fax:  [phone]  Physical Therapy Treatment  Patient Details  Name: James Park MRN: [id number] Date of Birth: 08-15-1929 Referring Provider:  Carylon Perches, MD  Encounter Date: 06/22/2014      PT End of Session - 06/22/14 1456    Visit Number 3   Number of Visits 16   Date for PT Re-Evaluation 07/04/14   Authorization Type BCBS Medicare Advantage    Authorization Time Period 06/06/14 to 08/06/14   Authorization - Visit Number 3   Authorization - Number of Visits 10   PT Start Time 1431   PT Stop Time 1445   PT Time Calculation (min) 14 min   Activity Tolerance Patient tolerated treatment well   Behavior During Therapy [facility] for tasks assessed/performed      Past Medical History  Diagnosis Date  . Atrial fibrillation 02/2011    Not anticoagulated  . Right bundle branch block   . Essential hypertension, benign   . Anxiety   . Depression   . Constipation   . GERD (gastroesophageal reflux disease)   . Arthritis   . Urinary retention     Self-catheterization when necessary  . Rotator cuff tear     Right - not repaired  . Stroke 05/2012    Gait disturbance only residual - hemorrhagic stroke  . CKD (chronic kidney disease) stage 5, GFR less than 15 ml/min     Previously on hemodialysis  . Pulmonary hypertension     Severe  . Chronotropic incompetence     Suspected based on GXT    Past Surgical History  Procedure Laterality Date  . Knee surgery      Tendon repair  . Eye surgery Bilateral     Cataract  . Insertion of dialysis catheter Right 06/30/2012    Procedure: INSERTION OF DIALYSIS CATHETER right IJ;  Surgeon: Larina Earthly, MD;  Location: [facility] OR;  Service: Vascular;  Laterality: Right;  . Tonsillectomy    . Appendectomy      There were no vitals filed for this visit.  Visit Diagnosis:  History of fall  Poor balance  Poor  posture  Proximal muscle weakness  Abnormality of gait  Impaired functional mobility and activity tolerance      Subjective Assessment - 06/22/14 1454    Subjective Patient arrived using walker and was with caregiver today, who stated that the patient is not doing well at all. Had another fall this week, and is having constant dizziness, just seeming really unsteady. States that patient's daughter is a Engineer, civil (consulting) and is able to monitor BP for him but did not perform a check this morning since she was at work.  Patient has still not gone to MD or ER due to falls or symptoms.    Pertinent History Patient had a fall recently, about 5 weeks ago; he was trying to get something out of his barn and got his feet tangled up in an electrical cord. No fractures. this was the first big fall he has had recently.    Patient Stated Goals better balance    Currently in Pain? No/denies                                 PT Education - 06/22/14 1455    Education provided Yes   Education Details education on  BP parameters for safe exercise today, strong encouragement for patient to see MD regarding current falls and symptoms; strongly encouraged to go to ER should patient feel worse today or if he should have another fall    Person(s) Educated Patient;Caregiver(s)   Methods Explanation   Comprehension Verbalized understanding          PT Short Term Goals - 06/08/14 1412    PT SHORT TERM GOAL #1   Title Patient will demonstrate a score of at least 42 on Berg balance test in order to reduce overall fall risk    Status On-going   PT SHORT TERM GOAL #2   Title Patient will demonstrate at least 4-/5 proximal muscle strength and 5/5 strength in bilateral lower extremities    Status On-going   PT SHORT TERM GOAL #3   Title Patient will demonstrate at least 30 degrees bilateral hip IR and 45 degrees bilateral hip ER    Status On-going   PT SHORT TERM GOAL #4   Title Patient will  demonstrate improved functional activity tolerance as evidenced by an ability to ambulate at least 619ft during 6 minute walk test with no assistive device    Status On-going           PT Long Term Goals - 06/08/14 1412    PT LONG TERM GOAL #1   Title Patient will be independent in correctly and consistently performing appropriate HEP    PT LONG TERM GOAL #2   Title Patient will demonstrate reduced fall risk as evidenced by an increase on Berg balance scale to at least 48 points    PT LONG TERM GOAL #3   Title Patient will demonstrate improved gait mechanics, including improved pelvic and trunk rotation, symmetrical bilateral arm swing, improved step lengths with improved TKE, improved foot clearance during gait, and improved gait speed    PT LONG TERM GOAL #4   Title Patient will demonstrate improved functional activity tolerance as evidenced by an ability to ambulate at least 861ft during 6 minute walk test with no fatigue and no assistive device    PT LONG TERM GOAL #5   Title Patient will demonstrate improved balance reaction skills and safety awareness as evidenced by successful navigation of obstacle courses on even and uneven surfaces and only supervision from therapist    PT LONG TERM GOAL #6   Title Patient to report that he has experienced no falls in or outside of his home within the past 8 weeks                Plan - 06/22/14 1457    Clinical Impression Statement Patient arrived with caregiver today, who stated that patient had another fall this week and has been overall very unsteady. Patient also states that he is a little dizzy at the moment. Took BP on auto-cuff and got reading of 169/42 with HR 40 BPM. Patient and caregiver education that even though automatic BP cuffs are not as accurate as manual, that it is not safe to perform exercise with current BP especially since patient is symptomatic with dizziness. Patient and caregiver both state that his HR is typically  very low and that today's BPM may be close to his norm. Strongly encouraged patient to make appointment with MD to discuss falls and dizziness; also strongly encouraged patient and caregiver to report to ER if patient should start to feel worse at any point today or if he has another fall. No charge for today's  visit.    Pt will benefit from skilled therapeutic intervention in order to improve on the following deficits Abnormal gait;Decreased coordination;Decreased range of motion;Difficulty walking;Decreased safety awareness;Decreased activity tolerance;Decreased balance;Hypomobility;Impaired flexibility;Improper body mechanics;Decreased mobility;Decreased strength;Postural dysfunction   Rehab Potential Good   PT Frequency 2x / week   PT Duration 8 weeks   PT Treatment/Interventions ADLs/Self Care Home Management;Moist Heat;Gait training;Stair training;Functional mobility training;Therapeutic activities;Therapeutic exercise;Balance training;Neuromuscular re-education;Patient/family education;Manual techniques;Energy conservation   PT Next Visit Plan Assess BP first, speak with patient and caregiver to assess dizziness and see if patient has had another fall. If BP/HR parameters are in safe range, gentle functional exercise and balance activities. Proximal muscle strength.    PT Home Exercise Plan given    Consulted and Agree with Plan of Care Patient        Problem List Patient Active Problem List   Diagnosis Date Noted  . Constipation 11/30/2013  . Loss of weight 11/30/2013  . Abnormal CT scan, colon 11/30/2013  . Pulmonary hypertension 05/20/2013  . SSS (sick sinus syndrome) 05/06/2013  . CKD (chronic kidney disease) stage 5, GFR less than 15 ml/min 05/06/2013  . Essential hypertension, benign 06/11/2012  . Right bundle branch block   . Atrial fibrillation 02/14/2011    Nedra Hai PT, DPT [phone]  [facility] [address]  [place], [place], [address] Phone: [phone]   Fax:  [phone]

## 2014-06-23 ENCOUNTER — Telehealth: Payer: Self-pay | Admitting: Gastroenterology

## 2014-06-23 NOTE — Telephone Encounter (Signed)
DR. Ouida Sills CALLED. PT NEEDS APPT ASAP-BLACK TARRY STOOLS WITH SLOW DROP IN Hb 11.4-9.8. PT KNOWN HISTORY OF ESRD-DEC [address]29 BUN 89 Cr 5.34. DECLINES DIALYSIS.

## 2014-06-26 ENCOUNTER — Telehealth: Payer: Self-pay | Admitting: Internal Medicine

## 2014-06-26 NOTE — Telephone Encounter (Signed)
I spoke with Herbert Seta and made her aware of the appt and she said she would speak with her father and let him know that he will be seeing RMR.

## 2014-06-26 NOTE — Telephone Encounter (Signed)
REVIEWED-NO ADDITIONAL RECOMMENDATIONS. 

## 2014-06-26 NOTE — Telephone Encounter (Signed)
I have been unable to reach patient and his daughter Herbert Seta). I have LMOM on patient's home number and his cell phone his been turned off where I can't leave a message and I have left 2 message on the daughter's cell phone that she has OV this week and that I went ahead and scheduled OV for patient for this Friday 6/17 at 11 with EG and explained that RMR was out of the office and we were trying to get patient scheduled ASAP per PCP due to patient's condition and to return my call to confirm she got my message.

## 2014-06-26 NOTE — Telephone Encounter (Signed)
I spoke with Tammy at Dr. Alonza Smoker office and made her aware that we have been unable to reach him.

## 2014-06-26 NOTE — Telephone Encounter (Signed)
Tammy made a note of James Park appointment just in case they tried to call their office.

## 2014-06-26 NOTE — Telephone Encounter (Signed)
Correction:she will let him know that he will not be seeing RMR

## 2014-06-26 NOTE — Addendum Note (Signed)
Addended by: Milinda Pointer on: 06/26/2014 09:48 AM   Modules accepted: Orders

## 2014-06-26 NOTE — Telephone Encounter (Signed)
LMOM for patient and daughter Herbert Seta) that patient has OV on 6/17 at 84 with EG and to call office to confirm that they got the message.

## 2014-06-26 NOTE — Telephone Encounter (Signed)
Noted  

## 2014-06-27 ENCOUNTER — Ambulatory Visit (HOSPITAL_COMMUNITY): Payer: Medicare Other

## 2014-06-27 DIAGNOSIS — Z9181 History of falling: Secondary | ICD-10-CM

## 2014-06-27 DIAGNOSIS — R293 Abnormal posture: Secondary | ICD-10-CM

## 2014-06-27 DIAGNOSIS — Z7409 Other reduced mobility: Secondary | ICD-10-CM

## 2014-06-27 DIAGNOSIS — M6281 Muscle weakness (generalized): Secondary | ICD-10-CM

## 2014-06-27 DIAGNOSIS — R2689 Other abnormalities of gait and mobility: Secondary | ICD-10-CM

## 2014-06-27 DIAGNOSIS — R269 Unspecified abnormalities of gait and mobility: Secondary | ICD-10-CM | POA: Diagnosis not present

## 2014-06-27 NOTE — Therapy (Signed)
[facility] [address] [place], [place], [address] Phone: [phone]   Fax:  [phone]  Physical Therapy Treatment  Patient Details  Name: James Park MRN: [id number] Date of Birth: [birth year] Referring Provider:  Eartha Inch, MD  Encounter Date: 06/27/2014      PT End of Session - 06/27/14 1518    Visit Number 5   Number of Visits 16   Date for PT Re-Evaluation 07/04/14   Authorization Type BCBS Medicare Advantage    Authorization Time Period 06/06/14 to 08/06/14   Authorization - Visit Number 5   Authorization - Number of Visits 10   PT Start Time 1436   PT Stop Time 1526   PT Time Calculation (min) 50 min      Past Medical History  Diagnosis Date  . Atrial fibrillation 02/2011    Not anticoagulated  . Right bundle branch block   . Essential hypertension, benign   . Anxiety   . Depression   . Constipation   . GERD (gastroesophageal reflux disease)   . Arthritis   . Urinary retention     Self-catheterization when necessary  . Rotator cuff tear     Right - not repaired  . Stroke 05/2012    Gait disturbance only residual - hemorrhagic stroke  . CKD (chronic kidney disease) stage 5, GFR less than 15 ml/min     Previously on hemodialysis  . Pulmonary hypertension     Severe  . Chronotropic incompetence     Suspected based on GXT    Past Surgical History  Procedure Laterality Date  . Knee surgery      Tendon repair  . Eye surgery Bilateral     Cataract  . Insertion of dialysis catheter Right 06/30/2012    Procedure: INSERTION OF DIALYSIS CATHETER right IJ;  Surgeon: Larina Earthly, MD;  Location: [facility] OR;  Service: Vascular;  Laterality: Right;  . Tonsillectomy    . Appendectomy      There were no vitals filed for this visit.  Visit Diagnosis:  History of fall  Poor balance  Poor posture  Proximal muscle weakness  Abnormality of gait  Impaired functional mobility and activity tolerance       Subjective Assessment - 06/27/14 1439    Subjective Pt stated he had discussion with MD earlier today, no reports of changes.  Has been assessing BP at home.  Daughter brought pt to dept today.  No reports of pain today, does has scab on Rt forehead and stated he feels pressure on back of head   Currently in Pain? No/denies            [facility] PT Assessment - 06/27/14 0001    Assessment   Medical Diagnosis recent fall    Next MD Visit [place] June 16th, 2016                     [facility] Adult PT Treatment/Exercise - 06/27/14 0001    Exercises   Exercises Knee/Hip   Knee/Hip Exercises: Standing   Heel Raises 15 reps   Forward Lunges Both;10 reps   Forward Lunges Limitations to 4" box without UE assist   Functional Squat 2 sets;10 reps   Other Standing Knee Exercises Tandem stance 4x 30", NBOS with isometric prerturbation; side stepping and retro gait 2RT   Knee/Hip Exercises: Seated   Other Seated Knee Exercises sit to stand avg chair 5 reps without UE's  PT Short Term Goals - 06/27/14 1823    PT SHORT TERM GOAL #1   Title Patient will demonstrate a score of at least 42 on Berg balance test in order to reduce overall fall risk    Status On-going   PT SHORT TERM GOAL #2   Title Patient will demonstrate at least 4-/5 proximal muscle strength and 5/5 strength in bilateral lower extremities    Status On-going   PT SHORT TERM GOAL #3   Title Patient will demonstrate at least 30 degrees bilateral hip IR and 45 degrees bilateral hip ER    PT SHORT TERM GOAL #4   Title Patient will demonstrate improved functional activity tolerance as evidenced by an ability to ambulate at least 669ft during 6 minute walk test with no assistive device    Status On-going           PT Long Term Goals - 06/27/14 1823    PT LONG TERM GOAL #1   Title Patient will be independent in correctly and consistently performing appropriate HEP    PT LONG TERM GOAL #2   Title  Patient will demonstrate reduced fall risk as evidenced by an increase on Berg balance scale to at least 48 points    PT LONG TERM GOAL #3   Title Patient will demonstrate improved gait mechanics, including improved pelvic and trunk rotation, symmetrical bilateral arm swing, improved step lengths with improved TKE, improved foot clearance during gait, and improved gait speed    PT LONG TERM GOAL #4   Title Patient will demonstrate improved functional activity tolerance as evidenced by an ability to ambulate at least 868ft during 6 minute walk test with no fatigue and no assistive device    PT LONG TERM GOAL #5   Title Patient will demonstrate improved balance reaction skills and safety awareness as evidenced by successful navigation of obstacle courses on even and uneven surfaces and only supervision from therapist    PT LONG TERM GOAL #6   Title Patient to report that he has experienced no falls in or outside of his home within the past 8 weeks                Plan - 06/27/14 1818    Clinical Impression Statement Vital signs assessed through session.  Pt reports no falls since last session and no reports of dizziness through session.  Session focus on functional strenghtening and incorporating balance activities following reports of fall last week. Min assistnace required with LOB episodes with static NBOS activiites due to weakness.  Pt limited by fatigue through session with some rest breaks required.  No reports of pain throguh session.  Initially BP 153/56 with HR 41 and BP 141/53 with HR 40 at end of session.     PT Next Visit Plan Assess reports of dizziness and BP, f/u with reports of reoccuring fall.  If BP/HR parameters are in safe range continue with gentle functional exercises and balance activities.        Problem List Patient Active Problem List   Diagnosis Date Noted  . Constipation 11/30/2013  . Loss of weight 11/30/2013  . Abnormal CT scan, colon 11/30/2013  .  Pulmonary hypertension 05/20/2013  . SSS (sick sinus syndrome) 05/06/2013  . CKD (chronic kidney disease) stage 5, GFR less than 15 ml/min 05/06/2013  . Essential hypertension, benign 06/11/2012  . Right bundle branch block   . Atrial fibrillation 02/14/2011   Juel Burrow, PTA  Becky Sax  Alvino Chapel 06/27/2014, 6:26 PM  [facility] [address] [place], [place], [address] Phone: [phone]   Fax:  [phone]

## 2014-06-29 ENCOUNTER — Ambulatory Visit (HOSPITAL_COMMUNITY): Payer: Medicare Other

## 2014-06-29 ENCOUNTER — Encounter (HOSPITAL_COMMUNITY): Payer: Self-pay | Admitting: Physical Therapy

## 2014-06-29 DIAGNOSIS — M6281 Muscle weakness (generalized): Secondary | ICD-10-CM

## 2014-06-29 DIAGNOSIS — Z7409 Other reduced mobility: Secondary | ICD-10-CM

## 2014-06-29 DIAGNOSIS — Z9181 History of falling: Secondary | ICD-10-CM

## 2014-06-29 DIAGNOSIS — R293 Abnormal posture: Secondary | ICD-10-CM

## 2014-06-29 DIAGNOSIS — R2689 Other abnormalities of gait and mobility: Secondary | ICD-10-CM

## 2014-06-29 DIAGNOSIS — R269 Unspecified abnormalities of gait and mobility: Secondary | ICD-10-CM

## 2014-06-29 NOTE — Therapy (Signed)
[facility] [address] [place], [place], [address] Phone: [phone]   Fax:  [phone]  Physical Therapy Treatment  Patient Details  Name: James Park MRN: [id number] Date of Birth: [birth year] Referring Provider:  Eartha Inch, MD  Encounter Date: 06/29/2014      PT End of Session - 06/29/14 1355    Visit Number 6   Number of Visits 16   Date for PT Re-Evaluation 07/04/14   Authorization Type [employer] Medicare Advantage    Authorization Time Period 06/06/14 to 08/06/14   Authorization - Visit Number 6   Authorization - Number of Visits 10   PT Start Time 1346   PT Stop Time 1440   PT Time Calculation (min) 54 min   Activity Tolerance Patient tolerated treatment well   Behavior During Therapy [facility] for tasks assessed/performed      Past Medical History  Diagnosis Date  . Atrial fibrillation 02/2011    Not anticoagulated  . Right bundle branch block   . Essential hypertension, benign   . Anxiety   . Depression   . Constipation   . GERD (gastroesophageal reflux disease)   . Arthritis   . Urinary retention     Self-catheterization when necessary  . Rotator cuff tear     Right - not repaired  . Stroke 05/2012    Gait disturbance only residual - hemorrhagic stroke  . CKD (chronic kidney disease) stage 5, GFR less than 15 ml/min     Previously on hemodialysis  . Pulmonary hypertension     Severe  . Chronotropic incompetence     Suspected based on GXT    Past Surgical History  Procedure Laterality Date  . Knee surgery      Tendon repair  . Eye surgery Bilateral     Cataract  . Insertion of dialysis catheter Right 06/30/2012    Procedure: INSERTION OF DIALYSIS CATHETER right IJ;  Surgeon: Larina Earthly, MD;  Location: [facility] OR;  Service: Vascular;  Laterality: Right;  . Tonsillectomy    . Appendectomy      There were no vitals filed for this visit.  Visit Diagnosis:  History of fall  Poor balance  Poor  posture  Proximal muscle weakness  Abnormality of gait  Impaired functional mobility and activity tolerance      Subjective Assessment - 06/29/14 1351    Subjective Pt stated he felt weak today, no reports of dizziness.   Currently in Pain? No/denies             [facility] PT Treatment/Exercise - 06/29/14 0001    Exercises   Exercises Knee/Hip   Knee/Hip Exercises: Standing   Heel Raises 20 reps   Heel Raises Limitations Toe raises 20x   Lateral Step Up Both;10 reps;Step Height: 4";Hand Hold: 1   Forward Step Up Both;10 reps;Step Height: 4";Hand Hold: 1   Functional Squat 2 sets;10 reps   Other Standing Knee Exercises Tandem stance 4x 30", NBOS with isometric prerturbation; side stepping and retro gait 2RT   Other Standing Knee Exercises Wall bumps shoulders and buttocks 10x each (min assistance required for shoulder)   Knee/Hip Exercises: Seated   Other Seated Knee Exercises sit to stand avg chair 2 sets x 5 reps without UE's                  PT Short Term Goals - 06/29/14 1433    PT SHORT TERM GOAL #1  Title Patient will demonstrate a score of at least 42 on Berg balance test in order to reduce overall fall risk    Status On-going   PT SHORT TERM GOAL #2   Title Patient will demonstrate at least 4-/5 proximal muscle strength and 5/5 strength in bilateral lower extremities    Status On-going   PT SHORT TERM GOAL #3   Title Patient will demonstrate at least 30 degrees bilateral hip IR and 45 degrees bilateral hip ER    PT SHORT TERM GOAL #4   Title Patient will demonstrate improved functional activity tolerance as evidenced by an ability to ambulate at least 636ft during 6 minute walk test with no assistive device    Status On-going           PT Long Term Goals - 06/29/14 1433    PT LONG TERM GOAL #1   Title Patient will be independent in correctly and consistently performing appropriate HEP    Status On-going   PT LONG TERM GOAL #2   Title  Patient will demonstrate reduced fall risk as evidenced by an increase on Berg balance scale to at least 48 points    Status On-going   PT LONG TERM GOAL #3   Title Patient will demonstrate improved gait mechanics, including improved pelvic and trunk rotation, symmetrical bilateral arm swing, improved step lengths with improved TKE, improved foot clearance during gait, and improved gait speed    PT LONG TERM GOAL #4   Title Patient will demonstrate improved functional activity tolerance as evidenced by an ability to ambulate at least 887ft during 6 minute walk test with no fatigue and no assistive device    PT LONG TERM GOAL #5   Title Patient will demonstrate improved balance reaction skills and safety awareness as evidenced by successful navigation of obstacle courses on even and uneven surfaces and only supervision from therapist    Status On-going   PT LONG TERM GOAL #6   Title Patient to report that he has experienced no falls in or outside of his home within the past 8 weeks                Plan - 06/29/14 1429    Clinical Impression Statement Pt entered dept ambulating with RW, unable to adjust walker to appropriate height; referral sent to MD for new walker with appropriate height to improve gait mechanics.  Pt with increased staggered gait and 2 LOB epsides during gait through session requiring min assistance for posterior lean.  Added wall bumps for core strengthening to assist with balance.  Pt limited by fatigue requiring 3 rest breaks through session.  Vital signs were assessed throuh session, initial BP 153/53 HR 42; end of session BP 159/53 and HR 42.  No reports of dizziness through session.   PT Next Visit Plan Assess reports of dizziness and BP, f/u with reports of reoccuring fall.  If BP/HR parameters are in safe range continue with gentle functional exercises and balance activities.        Problem List Patient Active Problem List   Diagnosis Date Noted  .  Constipation 11/30/2013  . Loss of weight 11/30/2013  . Abnormal CT scan, colon 11/30/2013  . Pulmonary hypertension 05/20/2013  . SSS (sick sinus syndrome) 05/06/2013  . CKD (chronic kidney disease) stage 5, GFR less than 15 ml/min 05/06/2013  . Essential hypertension, benign 06/11/2012  . Right bundle branch block   . Atrial fibrillation 02/14/2011   Juel Burrow,  PTA  Juel Burrow 06/29/2014, 4:46 PM  [facility] [address] [place], [place], [address] Phone: [phone]   Fax:  [phone]

## 2014-06-30 ENCOUNTER — Ambulatory Visit (INDEPENDENT_AMBULATORY_CARE_PROVIDER_SITE_OTHER): Payer: Medicare Other | Admitting: Nurse Practitioner

## 2014-06-30 ENCOUNTER — Encounter: Payer: Self-pay | Admitting: Nurse Practitioner

## 2014-06-30 ENCOUNTER — Other Ambulatory Visit: Payer: Self-pay

## 2014-06-30 VITALS — BP 138/53 | HR 48 | Temp 97.9°F | Ht 70.0 in | Wt 162.0 lb

## 2014-06-30 DIAGNOSIS — K921 Melena: Secondary | ICD-10-CM

## 2014-06-30 DIAGNOSIS — R195 Other fecal abnormalities: Secondary | ICD-10-CM | POA: Diagnosis not present

## 2014-06-30 DIAGNOSIS — D649 Anemia, unspecified: Secondary | ICD-10-CM | POA: Diagnosis not present

## 2014-06-30 MED ORDER — LANSOPRAZOLE 30 MG PO CPDR: 30.0000 mg | DELAYED_RELEASE_CAPSULE | Freq: Every day | ORAL | Status: DC

## 2014-06-30 NOTE — Assessment & Plan Note (Addendum)
Patient with a two-day episode of noted black stools approximately 2-3 weeks ago. Has not noticed any additional black stools or hematochezia symptoms. Patient has a history of peptic ulcer disease in his late 40s. Is not on any current PPI therapy. Black stools were also associated with a drop in his hemoglobin. He is normally mildly anemic with hemoglobin in the mid 11 range. Repeat on 06/23/2014 by PCP noted a hemoglobin of 9.8. Denies chest pain, shortness of breath, dizziness. Does admit lightheadedness, but states this is a chronic problem for him. Given his presentation we will refer him for endoscopic evaluation I'm checking a CBC today as well as a BMP to check his kidney function as he is complaining of increased itching. We'll also start him on a PPI. His PCP started him on her tonics 40 mg daily, however he stopped taking this as a gave him diarrhea. The only other PPI covered by his insurance is Prilosec, however this has a drug interaction with his citalopram. He is refusing to take Prevacid because of "I've hurt it does bad things." We will provide him with Dexilant samples to last 2 weeks until his endoscopy can be completed. Informed him and his daughter of the importance of taking PPI given his possible upper GI bleed and history of peptic ulcer disease. They verbalized understanding.  Proceed with EGD with Dr. Jena Gauss in near future: the risks, benefits, and alternatives have been discussed with the patient in detail. The patient states understanding and desires to proceed.  The patient is not on any anticoagulants. He is on a low-dose Celexa. Conscious sedation should be adequate for his procedure.

## 2014-06-30 NOTE — Assessment & Plan Note (Signed)
Rectal exam completed along with Hemoccult card which was mildly heme positive. No frank bright red blood noted on exam. Stool noted during exam was brown in color. Given his presentation, symptoms, and heme-positive stool we will start him on a PPI given his history of peptic ulcer disease, and refer him for endoscopic evaluation for possible upper GI bleed.  Proceed with EGD with Dr. Jena Gauss in near future: the risks, benefits, and alternatives have been discussed with the patient in detail. The patient states understanding and desires to proceed.  The patient is not on any anticoagulants. He is on a low-dose Celexa. Conscious sedation should be adequate for his procedure.

## 2014-06-30 NOTE — Patient Instructions (Addendum)
1. Have your labs drawn in the next day or so. 2. I send in a prescription for Prevacid 30 mg once a day. If this upset her stomach and causes diarrhea, give me a call and we can try to change into something else. 3. We will schedule your procedure for you. 4. Further recommendations to be based on the results of your procedure. 5. Return for further evaluation in 4-6 weeks with Dr. Jena Gauss.

## 2014-06-30 NOTE — Assessment & Plan Note (Addendum)
Patient with history of chronic mild anemia with a hemoglobin of typically runs in the range low to mid 11. Recheck of his H&H proximally 1 week ago by his PCP was noted to be hemoglobin of 9.8. He had 2 days of black stools. Has a history of peptic ulcer disease. Denies any further bleeding in the past couple weeks. Today we will recheck his CBC, start him on a PPI, and refer him for endoscopic evaluation for possible upper GI bleed. His stool was heme positive on rectal exam today.  Proceed with EGD with Dr. Jena Gauss in near future: the risks, benefits, and alternatives have been discussed with the patient in detail. The patient states understanding and desires to proceed.  The patient is not on any anticoagulants. He is on a low-dose Celexa. Conscious sedation should be adequate for his procedure.

## 2014-06-30 NOTE — Progress Notes (Signed)
Referring Provider: Eartha Inch, MD Primary Care Physician:  Eartha Inch, MD Primary GI:  Dr. Jena Gauss  Chief Complaint  Patient presents with  . Melena    HPI:   81 year old male referred by Dr. Doylene Bode for melena. Office note not included in paperwork sent with the patient. Labs were included. H and H on 05/24/2014 was 11.4/34.0. A repeat CBC completed 06/23/2014 found a drop in his hemoglobin and hematocrit down to 9.8/28.2. He has a history of GERD and is currently on Protonix. Has history of atrial fibrillation but is not anticoagulated.  Today he states he had a fall 06/10/14. Around 06/16/14 he noticed black stools for 2 days at which point it ceased. He was given Protonix 40 mg which gave him diarrhea. He stopped taking the Protonix and diarrhea resolved. Admits lightheadedness which he states is chronic. Admits chronic lower abdominal pain. Denies epigastric pain, GERD symptoms including esophageal burning, reflux, bitter acid taste.  Past Medical History  Diagnosis Date  . Atrial fibrillation 02/2011    Not anticoagulated  . Right bundle branch block   . Essential hypertension, benign   . Anxiety   . Depression   . Constipation   . GERD (gastroesophageal reflux disease)   . Arthritis   . Urinary retention     Self-catheterization when necessary  . Rotator cuff tear     Right - not repaired  . Stroke 05/2012    Gait disturbance only residual - hemorrhagic stroke  . CKD (chronic kidney disease) stage 5, GFR less than 15 ml/min     Previously on hemodialysis  . Pulmonary hypertension     Severe  . Chronotropic incompetence     Suspected based on GXT    Past Surgical History  Procedure Laterality Date  . Knee surgery      Tendon repair  . Eye surgery Bilateral     Cataract  . Insertion of dialysis catheter Right 06/30/2012    Procedure: INSERTION OF DIALYSIS CATHETER right IJ;  Surgeon: Larina Earthly, MD;  Location: [facility] OR;  Service: Vascular;  Laterality:  Right;  . Tonsillectomy    . Appendectomy      Current Outpatient Prescriptions  Medication Sig Dispense Refill  . albuterol (PROVENTIL) (2.5 MG/3ML) 0.083% nebulizer solution Take 2.5 mg by nebulization every 6 (six) hours as needed for wheezing or shortness of breath.    . ALPRAZolam (XANAX) 0.25 MG tablet Take 0.25 mg by mouth.    . calcium acetate (PHOSLO) 667 MG capsule Take 667 mg by mouth 3 (three) times daily with meals. Pt said he only takes one tablet in the morning    . citalopram (CELEXA) 20 MG tablet Take 20 mg by mouth.    . doxazosin (CARDURA) 1 MG tablet Take 1 mg by mouth daily.    . Linaclotide (LINZESS) 290 MCG CAPS capsule Take 1 capsule (290 mcg total) by mouth daily. 30 capsule 11  . losartan (COZAAR) 50 MG tablet Take 50 mg by mouth 2 (two) times daily.    Marland Kitchen lubiprostone (AMITIZA) 24 MCG capsule Take 1 capsule (24 mcg total) by mouth 2 (two) times daily with a meal. 60 capsule 3  . Melatonin 5 MG TABS Take 5 mg by mouth at bedtime as needed (Sleep).    . sodium bicarbonate 650 MG tablet Take 650 mg by mouth daily.    . Vitamin D, Ergocalciferol, (DRISDOL) 50000 UNITS CAPS capsule Take 50,000 Units by mouth.    Marland Kitchen  pantoprazole (PROTONIX) 40 MG tablet   11   No current facility-administered medications for this visit.    Allergies as of 06/30/2014 - Review Complete 06/30/2014  Allergen Reaction Noted  . Altace [ramipril] Hives 06/11/2012    Family History  Problem Relation Age of Onset  . Parkinson's disease Brother   . Colon cancer Neg Hx     History   Social History  . Marital Status: Divorced    Spouse Name: N/A  . Number of Children: 2  . Years of Education: N/A   Social History Main Topics  . Smoking status: Never Smoker   . Smokeless tobacco: Not on file     Comment: quit 1967  . Alcohol Use: 1.2 oz/week    2 Cans of beer per week     Comment: 2 cans of beer daily  . Drug Use: No  . Sexual Activity: Not on file   Other Topics Concern  .  None   Social History Narrative    Review of Systems: General: Negative for anorexia, fever, chills. ENT: Negative for hoarseness, difficulty swallowing. CV: Negative for chest pain, angina, palpitations, peripheral edema.  Respiratory: Negative for dyspnea at rest, cough, wheezing.  GI: See history of present illness. Derm: Negative for rash. Has had some increased itching.   Endo: Negative for unusual weight change.  Heme: Negative for bruising or bleeding. Allergy: Negative for rash or hives.   Physical Exam: BP 138/53 mmHg  Pulse 48  Temp(Src) 97.9 F (36.6 C) (Oral)  Ht  (1.778 m)  Wt 162 lb (73.483 kg)  BMI 23.24 kg/m2 General:   Alert and oriented. Pleasant and cooperative. Well-nourished and well-developed.  Head:  Normocephalic and atraumatic. Cardiovascular:  S1, S2 present without murmurs appreciated. Extremities without clubbing or edema. Respiratory:  Clear to auscultation bilaterally. No wheezes, rales, or rhonchi. No distress.  Gastrointestinal:  +BS, soft, non-tender and non-distended. No HSM noted. No guarding or rebound. No masses appreciated.  Rectal:  External hemorrhoids noted, no obvious/large internal hemorrhoids. No fecal impaction. No bright red blood noted. Stool heme+ on stool card.  Musculoskalatal:  Unsteady gait. Skin:  Intact without significant lesions or rashes noted. Neurologic:  Alert and oriented x4;  grossly normal neurologically. Psych:  Alert and cooperative. Normal mood and affect. Heme/Lymph/Immune: No excessive bruising noted.    06/30/2014 11:41 AM

## 2014-07-03 NOTE — Progress Notes (Addendum)
Received lab results back from [facility]. H/H are stable with a hemoglobin of 9.7 and hematocrit of 31.0. MCHC is low at 31.3. Creatinine remains elevated at 5.02. Continue with current plan for PPI and endoscopy in the near future, which is already scheduled. Labs to be scanned into EMR.

## 2014-07-03 NOTE — Progress Notes (Signed)
cc'd to pcp 

## 2014-07-04 ENCOUNTER — Telehealth: Payer: Self-pay

## 2014-07-04 ENCOUNTER — Ambulatory Visit (HOSPITAL_COMMUNITY): Payer: Medicare Other | Admitting: Physical Therapy

## 2014-07-04 NOTE — Telephone Encounter (Signed)
Tried to do a PA for pts lansoprazole. And it was denied.  Pt has only tried pantoprazole. He will have to try and fail omeprazole prior to getting lansoprazole approved.

## 2014-07-05 ENCOUNTER — Other Ambulatory Visit: Payer: Self-pay | Admitting: Nurse Practitioner

## 2014-07-05 NOTE — Telephone Encounter (Signed)
Please disregard previous note. Patient cannot take omeprazole because it has a potential drug-drug interaction with his Citalopram (possible serotonin syndrome.)

## 2014-07-05 NOTE — Telephone Encounter (Signed)
Noted. I will send in omeprazole 20 mg daily for 1 month. Please notify him of insurance decision and have him call us after taking Omeprazole for 4 weeks and let us know if it has helped. If not please proceed with the PA.

## 2014-07-05 NOTE — Telephone Encounter (Signed)
PA has been redone and sent to the insurance co.

## 2014-07-06 ENCOUNTER — Telehealth (HOSPITAL_COMMUNITY): Payer: Self-pay | Admitting: Physical Therapy

## 2014-07-06 ENCOUNTER — Ambulatory Visit (HOSPITAL_COMMUNITY): Payer: Medicare Other | Admitting: Physical Therapy

## 2014-07-06 DIAGNOSIS — R269 Unspecified abnormalities of gait and mobility: Secondary | ICD-10-CM | POA: Diagnosis not present

## 2014-07-06 DIAGNOSIS — R2681 Unsteadiness on feet: Secondary | ICD-10-CM

## 2014-07-06 DIAGNOSIS — Z9181 History of falling: Secondary | ICD-10-CM

## 2014-07-06 DIAGNOSIS — R2689 Other abnormalities of gait and mobility: Secondary | ICD-10-CM

## 2014-07-06 DIAGNOSIS — R293 Abnormal posture: Secondary | ICD-10-CM

## 2014-07-06 DIAGNOSIS — Z7409 Other reduced mobility: Secondary | ICD-10-CM

## 2014-07-06 DIAGNOSIS — M6281 Muscle weakness (generalized): Secondary | ICD-10-CM

## 2014-07-06 NOTE — Telephone Encounter (Signed)
Patient's daughter Herbert Seta) returned phone call. PT described today's situation with high levels of patient agitation today during re-assessment as per daughter's request. Daughter is very involved in patient's care and states she is concerned regarding his balance and getting up from teh floor, as she has multiple times come home to find him on the floor for over 13 hours after a fall; she also stated that she is aware that the patient can be difficult and thanked PT staff for efforts provided in his rehabilitation. PT recommended that patient use walker due to high fall risk; also educated daughter regarding possible need for OT services. Continued to educate daughter that patient is very high fall risk at this time and that patient will benefit from continued skilled PT services if he will participate. Deferred daughter's questions about co-pay to front desk staff today. Daughter again was very thankful for PT staff's efforts in working with the patient and stated that she will speak to him tonight about his behavior in therapy today.  Nedra Hai PT, DPT [phone]

## 2014-07-06 NOTE — Telephone Encounter (Signed)
Letter has been faxed to the appeals department.  

## 2014-07-06 NOTE — Therapy (Signed)
[facility] [address] [place], [place], [address] Phone: [phone]   Fax:  [phone]  Physical Therapy Treatment (Re-Assessment)  Patient Details  Name: James Park MRN: [id number] Date of Birth: 1929/07/29 Referring Provider:  Carylon Perches, MD  Encounter Date: 07/06/2014      PT End of Session - 07/06/14 1549    Visit Number 7   Number of Visits 16   Date for PT Re-Evaluation 08/03/14   Authorization Type BCBS Medicare Advantage    Authorization Time Period 06/06/14 to 08/06/14; G-code done 7th visit    Authorization - Visit Number 7   Authorization - Number of Visits 10   PT Start Time 1350   PT Stop Time 1435   PT Time Calculation (min) 45 min   Activity Tolerance Patient tolerated treatment well   Behavior During Therapy [facility] for tasks assessed/performed      Past Medical History  Diagnosis Date  . Atrial fibrillation 02/2011    Not anticoagulated  . Right bundle branch block   . Essential hypertension, benign   . Anxiety   . Depression   . Constipation   . GERD (gastroesophageal reflux disease)   . Arthritis   . Urinary retention     Self-catheterization when necessary  . Rotator cuff tear     Right - not repaired  . Stroke 05/2012    Gait disturbance only residual - hemorrhagic stroke  . CKD (chronic kidney disease) stage 5, GFR less than 15 ml/min     Previously on hemodialysis  . Pulmonary hypertension     Severe  . Chronotropic incompetence     Suspected based on GXT  . PUD (peptic ulcer disease)     In his late 74s    Past Surgical History  Procedure Laterality Date  . Knee surgery      Tendon repair  . Eye surgery Bilateral     Cataract  . Insertion of dialysis catheter Right 06/30/2012    Procedure: INSERTION OF DIALYSIS CATHETER right IJ;  Surgeon: Larina Earthly, MD;  Location: [facility] OR;  Service: Vascular;  Laterality: Right;  . Tonsillectomy    . Appendectomy      There were no vitals  filed for this visit.  Visit Diagnosis:  History of fall  Poor balance  Poor posture  Proximal muscle weakness  Abnormality of gait  Impaired functional mobility and activity tolerance  Unsteadiness      Subjective Assessment - 07/06/14 1353    Subjective Patient arrived with caregiver without rolling walker, fereling good today and states he has endoscopy next week    Pertinent History Patient had a fall recently, about 5 weeks ago; he was trying to get something out of his barn and got his feet tangled up in an electrical cord. No fractures. this was the first big fall he has had recently.    Patient Stated Goals better balance    Currently in Pain? No/denies            [facility] PT Assessment - 07/06/14 0001    Observation/Other Assessments   Observations 6 minute walk 646ft   Focus on Therapeutic Outcomes (FOTO)  54% limited    AROM   Right Hip External Rotation  50   Right Hip Internal Rotation  24   Left Hip External Rotation  41   Left Hip Internal Rotation  23   Right Ankle Dorsiflexion 19   Left Ankle Dorsiflexion 20  Strength   Right Hip Flexion 3/5   Right Hip ABduction 3/5   Left Hip Flexion 3/5   Left Hip ABduction 2+/5   Right Knee Flexion 4-/5   Right Knee Extension 4/5   Left Knee Flexion 3/5   Left Knee Extension 4-/5   Right Ankle Dorsiflexion 5/5   Left Ankle Dorsiflexion 5/5   Ambulation/Gait   Gait Comments very rigid trunk and hips with minimal arm swing, reduced step lengths/stance time, knee flexion in  stance, reduced ankle DF, possible slight varus knees    Berg Balance Test   Sit to Stand Able to stand  independently using hands   Standing Unsupported Able to stand safely 2 minutes   Sitting with Back Unsupported but Feet Supported on Floor or Stool Able to sit safely and securely 2 minutes   Stand to Sit Controls descent by using hands   Transfers Able to transfer safely, minor use of hands   Standing Unsupported with Eyes Closed  Able to stand 10 seconds with supervision   Standing Ubsupported with Feet Together Able to place feet together independently and stand for 1 minute with supervision   From Standing, Reach Forward with Outstretched Arm Can reach forward >5 cm safely (2")   From Standing Position, Pick up Object from Floor Able to pick up shoe, needs supervision   From Standing Position, Turn to Look Behind Over each Shoulder Looks behind one side only/other side shows less weight shift   Turn 360 Degrees Able to turn 360 degrees safely but slowly   Standing Unsupported, Alternately Place Feet on Step/Stool Able to complete >2 steps/needs minimal assist   Standing Unsupported, One Foot in [employer] balance while stepping or standing   Standing on One Leg Unable to try or needs assist to prevent fall   Total Score 35                             PT Education - 07/06/14 1548    Education provided Yes   Education Details extensive education regarding progress with skilled PT services, rationale for continuing forward with PT, prescription for rolling walker from MD, skilled OT services    Person(s) Educated Patient;Caregiver(s)   Methods Explanation   Comprehension Verbalized understanding          PT Short Term Goals - 07/06/14 1555    PT SHORT TERM GOAL #1   Title Patient will demonstrate a score of at least 42 on Berg balance test in order to reduce overall fall risk    Time 4   Period Weeks   Status On-going   PT SHORT TERM GOAL #2   Title Patient will demonstrate at least 4-/5 proximal muscle strength and 5/5 strength in bilateral lower extremities    Time 4   Period Weeks   Status On-going   PT SHORT TERM GOAL #3   Title Patient will demonstrate at least 30 degrees bilateral hip IR and 45 degrees bilateral hip ER    Time 4   Period Weeks   Status On-going   PT SHORT TERM GOAL #4   Title Patient will demonstrate improved functional activity tolerance as evidenced by an  ability to ambulate at least 639ft during 6 minute walk test with no assistive device    Time 4   Period Weeks   Status Achieved           PT Long Term Goals -  07/06/14 1556    PT LONG TERM GOAL #1   Title Patient will be independent in correctly and consistently performing appropriate HEP    Time 8   Period Weeks   Status On-going   PT LONG TERM GOAL #2   Title Patient will demonstrate reduced fall risk as evidenced by an increase on Berg balance scale to at least 48 points    Time 8   Period Weeks   Status On-going   PT LONG TERM GOAL #3   Title Patient will demonstrate improved gait mechanics, including improved pelvic and trunk rotation, symmetrical bilateral arm swing, improved step lengths with improved TKE, improved foot clearance during gait, and improved gait speed    Time 8   Period Weeks   Status New   PT LONG TERM GOAL #4   Title Patient will demonstrate improved functional activity tolerance as evidenced by an ability to ambulate at least 89ft during 6 minute walk test with no fatigue and no assistive device    Time 8   Period Weeks   Status On-going   PT LONG TERM GOAL #5   Title Patient will demonstrate improved balance reaction skills and safety awareness as evidenced by successful navigation of obstacle courses on even and uneven surfaces and only supervision from therapist    Time 8   Period Weeks   Status On-going   PT LONG TERM GOAL #6   Title Patient to report that he has experienced no falls in or outside of his home within the past 8 weeks    Time 8   Period Weeks   Status On-going               Plan - 07/06/14 1550    Clinical Impression Statement Re-assessment performed today. Patient continues to demonstrate significant postural and gait impairments, significantly reduced dynamic balance and balance reaction skills, reduced bilateral LE and proximal muscle strength, reduced functional activity tolerance, and reduced functional task  performance skills. At this time patient remains a very high fall risk and does not appear to fully comprehend his high risk of fall. At this time patient was very agitated towards PT during re-assessment, stating "...I don't think I'm getting enough time spent on me during PT, especially today because you're just doing this for insurance and not for me!!!.......I don't know that I want to continue with PT even though I know I'm a big fall risk ". Provided EXTENSIVE education regarding importance of re-assessments and how PT is moving forward and identifying major strengths and weaknesses in order to develop an effective plan of care moving forward. Patient remained very agitated towards PT, stating "...this is BS, I want to know how much you're charging me for this nothing!". Patient finally became agreeable to trialing 3 more weeks of skilled PT services at 2x/week to contiue to attempt to address impairments. Also advised patient to speak to MD for possible OT order to address patient's complaints about shoulder stiffness and pain.    Pt will benefit from skilled therapeutic intervention in order to improve on the following deficits Abnormal gait;Decreased coordination;Decreased range of motion;Difficulty walking;Decreased safety awareness;Decreased activity tolerance;Decreased balance;Hypomobility;Impaired flexibility;Improper body mechanics;Decreased mobility;Decreased strength;Postural dysfunction   Rehab Potential Good   PT Frequency 2x / week   PT Duration 3 weeks   PT Treatment/Interventions ADLs/Self Care Home Management;Moist Heat;Gait training;Stair training;Functional mobility training;Therapeutic activities;Therapeutic exercise;Balance training;Neuromuscular re-education;Patient/family education;Manual techniques;Energy conservation   PT Next Visit Plan Functional strength and balance, activity tolerance.  PT Home Exercise Plan given    Consulted and Agree with Plan of Care Patient           G-Codes - 30-Jul-2014 1557    Functional Assessment Tool Used FOTO    Functional Limitation Mobility: Walking and moving around   Mobility: Walking and Moving Around Current Status [phone]) At least 40 percent but less than 60 percent impaired, limited or restricted   Mobility: Walking and Moving Around Goal Status [phone]) At least 20 percent but less than 40 percent impaired, limited or restricted      Problem List Patient Active Problem List   Diagnosis Date Noted  . Melena 06/30/2014  . Anemia 06/30/2014  . Heme + stool 06/30/2014  . Constipation 11/30/2013  . Loss of weight 11/30/2013  . Abnormal CT scan, colon 11/30/2013  . Pulmonary hypertension 05/20/2013  . SSS (sick sinus syndrome) 05/06/2013  . CKD (chronic kidney disease) stage 5, GFR less than 15 ml/min 05/06/2013  . Essential hypertension, benign 06/11/2012  . Right bundle branch block   . Atrial fibrillation 02/14/2011     Physical Therapy Progress Note  Dates of Reporting Period: 06/06/14  to 07-30-14  Objective Reports of Subjective Statement: Patient agrees he is high fall risk, states that his shoulders are really bothering him and that he has endoscopy next week. Patient very agitated towards PT today especially when discussing today's session and plan of care moving forward.   Objective Measurements: see above   Goal Update: see above   Plan: Trial 3 more weeks of skilled PT services at 2x/week due to patient's apparent reduced motivation to participate; instructed patient to speak to primary MD regarding possible referral for OT from MD for shoulder pain   Reason Skilled Services are Required: see above   Nedra Hai PT, DPT [phone]  [facility] [address] [place], [place], [address] Phone: [phone]   Fax:  [phone]

## 2014-07-06 NOTE — Telephone Encounter (Signed)
Letter done

## 2014-07-06 NOTE — Telephone Encounter (Signed)
Pts insurance will not let me redo the PA with the new information. An appeal letter will have to be done.

## 2014-07-06 NOTE — Telephone Encounter (Signed)
Called patient's daughter after very agitated and aggressive behavior from patient during today's reassessment (daughter had requested that clinic do so if patient demonstrates agitation towards staff). Left message requesting that daughter call clinic back to discuss patient's behavior and participation with skilled PT services.  Nedra Hai PT, DPT [phone]

## 2014-07-10 ENCOUNTER — Other Ambulatory Visit: Payer: Self-pay

## 2014-07-10 ENCOUNTER — Telehealth: Payer: Self-pay

## 2014-07-10 ENCOUNTER — Telehealth (HOSPITAL_COMMUNITY): Payer: Self-pay

## 2014-07-10 DIAGNOSIS — D649 Anemia, unspecified: Secondary | ICD-10-CM

## 2014-07-10 DIAGNOSIS — E875 Hyperkalemia: Secondary | ICD-10-CM

## 2014-07-10 NOTE — Telephone Encounter (Signed)
pts daughter- Herbert SetaHeather is aware that we have done an appeal for him to be able to get this medication.

## 2014-07-10 NOTE — Telephone Encounter (Signed)
Called daughter back Herbert Seta) on 07/10/14 to confirm OT Eval apptment with her. Also confirmed that Herbert Seta knew about the new walker with rollers that we gave her dad last week. Heahter state she knew about the order but it had not been filled yet. Mr. Calma fell last night at midnight getting up from his computer desk and hit the wall. Heather wanted the PT's to know about this fall. NF 5:06 pm 07/10/2014

## 2014-07-10 NOTE — Telephone Encounter (Signed)
I spoke with the pts daughter- James Park- today, she was concerned about his hemoglobin. She stated pt is still feeling weak. I went over recent blood work with her (see addendum to EG ov note) and spoke with AS. Pt needs to repeat his blood work tomorrow to make sure his potassium has returned to normal. (K+ was 5.6 on recent [facility] labs). Daughter requested rechecking hemoglobin. Per AS ok to have it done. [facility]. The daughter is aware and said she would make sure he got it done tomorrow.

## 2014-07-10 NOTE — Telephone Encounter (Signed)
Agree 

## 2014-07-11 ENCOUNTER — Ambulatory Visit (HOSPITAL_COMMUNITY): Payer: Medicare Other

## 2014-07-11 DIAGNOSIS — R269 Unspecified abnormalities of gait and mobility: Secondary | ICD-10-CM

## 2014-07-11 DIAGNOSIS — R293 Abnormal posture: Secondary | ICD-10-CM

## 2014-07-11 DIAGNOSIS — M6281 Muscle weakness (generalized): Secondary | ICD-10-CM

## 2014-07-11 DIAGNOSIS — Z9181 History of falling: Secondary | ICD-10-CM

## 2014-07-11 DIAGNOSIS — Z7409 Other reduced mobility: Secondary | ICD-10-CM

## 2014-07-11 DIAGNOSIS — R2689 Other abnormalities of gait and mobility: Secondary | ICD-10-CM

## 2014-07-11 DIAGNOSIS — R2681 Unsteadiness on feet: Secondary | ICD-10-CM

## 2014-07-11 NOTE — Telephone Encounter (Signed)
Noted  

## 2014-07-11 NOTE — Telephone Encounter (Signed)
Can we see if the patient went yet? He will need it drawn stat tomorrow if he doesn't do it today, and there is a chance that it could be cancelled if his potassium is too high.

## 2014-07-11 NOTE — Telephone Encounter (Signed)
Not yet

## 2014-07-11 NOTE — Telephone Encounter (Signed)
Pt said he went about 2:30 to [facility], and results are not back yet.

## 2014-07-11 NOTE — Telephone Encounter (Signed)
Do we have labs back yet?

## 2014-07-11 NOTE — Therapy (Signed)
[place], [place], [address] Phone: [phone]   Fax:  [phone]  Physical Therapy Treatment  Patient Details  Name: James Park MRN: [id number] Date of Birth: [birth year] Referring Provider:  Asencion Noble, MD  Encounter Date: 07/11/2014      PT End of Session - 07/11/14 1414    Visit Number 8   Number of Visits 16   Date for PT Re-Evaluation 08/03/14   Authorization Type BCBS Medicare Advantage    Authorization Time Period 06/06/14 to 08/06/14; G-code done 7th visit    Authorization - Visit Number 8   Authorization - Number of Visits 17   PT Start Time 1351   PT Stop Time 1436   PT Time Calculation (min) 45 min   Equipment Utilized During Treatment Gait belt   Activity Tolerance Patient tolerated treatment well   Behavior During Therapy [facility] for tasks assessed/performed      Past Medical History  Diagnosis Date  . Atrial fibrillation 02/2011    Not anticoagulated  . Right bundle branch block   . Essential hypertension, benign   . Anxiety   . Depression   . Constipation   . GERD (gastroesophageal reflux disease)   . Arthritis   . Urinary retention     Self-catheterization when necessary  . Rotator cuff tear     Right - not repaired  . Stroke 05/2012    Gait disturbance only residual - hemorrhagic stroke  . CKD (chronic kidney disease) stage 5, GFR less than 15 ml/min     Previously on hemodialysis  . Pulmonary hypertension     Severe  . Chronotropic incompetence     Suspected based on GXT  . PUD (peptic ulcer disease)     In his late 72s    Past Surgical History  Procedure Laterality Date  . Knee surgery      Tendon repair  . Eye surgery Bilateral     Cataract  . Insertion of dialysis catheter Right 06/30/2012    Procedure: INSERTION OF DIALYSIS CATHETER right IJ;  Surgeon: Rosetta Posner, MD;  Location: [facility];  Service: Vascular;  Laterality: Right;  . Tonsillectomy    . Appendectomy       There were no vitals filed for this visit.  Visit Diagnosis:  History of fall  Poor balance  Poor posture  Proximal muscle weakness  Abnormality of gait  Impaired functional mobility and activity tolerance  Unsteadiness      Subjective Assessment - 07/11/14 1355    Subjective Pt and caregiver reported fall on Saturday, trying to sit on computer chair and ended on floor with sore Lt UE and posterior head   Currently in Pain? No/denies           [facility] Adult PT Treatment/Exercise - 07/11/14 0001    Exercises   Exercises Knee/Hip   Knee/Hip Exercises: Aerobic   Nustep Pt Reports he has one at home   Knee/Hip Exercises: Standing   Forward Lunges Both;15 reps   Forward Lunges Limitations to 4" box without UE assist   Side Lunges Both;10 reps   Side Lunges Limitations 4in step   Hip Abduction Both;10 reps   Abduction Limitations Hip abduction 15x Bil 3#   Functional Squat 2 sets;10 reps   Functional Squat Limitations 10 STS no HHA   Stairs 2RT ascending 7in descending 4in reciprocal pattern 1HR   Gait Training 226 with emphasis on increasing stride length  and heel to toe   Other Standing Knee Exercises Tandem stance 2x 30", Tandem and retro gait 2RT; shoulder and hip wall bumps 10x each   Other Standing Knee Exercises sidestepping trial with RTB, unable due to weakness   Knee/Hip Exercises: Seated   Other Seated Knee/Hip Exercises sit to stand avg chair 10 reps without UE's                  PT Short Term Goals - 07/11/14 1414    PT SHORT TERM GOAL #1   Title Patient will demonstrate a score of at least 42 on Berg balance test in order to reduce overall fall risk    Status On-going   PT SHORT TERM GOAL #2   Title Patient will demonstrate at least 4-/5 proximal muscle strength and 5/5 strength in bilateral lower extremities    Status On-going   PT SHORT TERM GOAL #3   Title Patient will demonstrate at least 30 degrees bilateral hip IR and 45 degrees  bilateral hip ER    Status On-going   PT SHORT TERM GOAL #4   Title Patient will demonstrate improved functional activity tolerance as evidenced by an ability to ambulate at least 617f during 6 minute walk test with no assistive device    Status Achieved           PT Long Term Goals - 07/11/14 1415    PT LONG TERM GOAL #1   Title Patient will be independent in correctly and consistently performing appropriate HEP    Status On-going   PT LONG TERM GOAL #2   Title Patient will demonstrate reduced fall risk as evidenced by an increase on Berg balance scale to at least 48 points    Status On-going   PT LONG TERM GOAL #3   Title Patient will demonstrate improved gait mechanics, including improved pelvic and trunk rotation, symmetrical bilateral arm swing, improved step lengths with improved TKE, improved foot clearance during gait, and improved gait speed    PT LONG TERM GOAL #4   Title Patient will demonstrate improved functional activity tolerance as evidenced by an ability to ambulate at least 8525fduring 6 minute walk test with no fatigue and no assistive device    PT LONG TERM GOAL #5   Title Patient will demonstrate improved balance reaction skills and safety awareness as evidenced by successful navigation of obstacle courses on even and uneven surfaces and only supervision from therapist    Status On-going   PT LONG TERM GOAL #6   Title Patient to report that he has experienced no falls in or outside of his home within the past 8 weeks    Status Not Met               Plan - 07/11/14 1440    Clinical Impression Statement Pt educated on increased risk of falls with shuffle gait pattern and importance of increasing stride length and heel to toe pattern with gait mechanics to normalize gait mechanics.  Session focus on improving gait mechanics, functional strengthening and balance activiteis.  Pt educated on importance of completeing HEP dailiy for strengthening and balance.   Reviewed complaince and what exercises he is doing at home, pt able to verbalize and demonstrate correct technique with current HEP.  Session focus on awareness of spatial awareness and reality of risk of falls; continued with wall bumps complete to reduce posterior lean and improve core strengthening to assist with balance.  Trial with red  theraband resistance sidestepping, pt unable to complete due to weakness.  Pt limted by fatigue at end of session, no reports of pain.  Pt did state he has a Nustep like machine at home, pt encouraged to utilize more to improve activity tolerance.     PT Next Visit Plan Functional strength and balance, activity tolerance.         Problem List Patient Active Problem List   Diagnosis Date Noted  . Melena 06/30/2014  . Anemia 06/30/2014  . Heme + stool 06/30/2014  . Constipation 11/30/2013  . Loss of weight 11/30/2013  . Abnormal CT scan, colon 11/30/2013  . Pulmonary hypertension 05/20/2013  . SSS (sick sinus syndrome) 05/06/2013  . CKD (chronic kidney disease) stage 5, GFR less than 15 ml/min 05/06/2013  . Essential hypertension, benign 06/11/2012  . Right bundle branch block   . Atrial fibrillation 02/14/2011   Aldona Lento, PTA  Aldona Lento 07/11/2014, 2:49 PM  [place] [address] [place], [place], [address] Phone: [phone]   Fax:  [phone]

## 2014-07-12 ENCOUNTER — Telehealth: Payer: Self-pay | Admitting: Internal Medicine

## 2014-07-12 ENCOUNTER — Ambulatory Visit (HOSPITAL_COMMUNITY): Admission: RE | Admit: 2014-07-12 | Payer: Medicare Other | Source: Ambulatory Visit | Admitting: Internal Medicine

## 2014-07-12 ENCOUNTER — Encounter (HOSPITAL_COMMUNITY): Admission: RE | Payer: Self-pay | Source: Ambulatory Visit

## 2014-07-12 SURGERY — EGD (ESOPHAGOGASTRODUODENOSCOPY)
Anesthesia: Moderate Sedation

## 2014-07-12 NOTE — Telephone Encounter (Signed)
I talked with him and he is unable to lay in his left. He said that he would just come back to the office to se RMR and get it rescheduled.

## 2014-07-12 NOTE — Progress Notes (Signed)
Spoke with pts daughterHerbert Seta- Heather and she is aware.

## 2014-07-12 NOTE — Telephone Encounter (Signed)
Noted  

## 2014-07-12 NOTE — Telephone Encounter (Signed)
Patient called today to let us know that he had fallen over the weekend and has open wounds to his left side (arm and leg) and also on his head. He does not want to have his EGD today and will need to reschedule. Patient was scheduled another OV with RMR ONLY for 7/19 at 2pm and will be rescheduled when he comes that day.

## 2014-07-12 NOTE — Progress Notes (Signed)
cc'd pcp 

## 2014-07-12 NOTE — Progress Notes (Addendum)
Update: potassium also noted to be 5.6 on [facility] Cr at that time elevated at 5.02. This is not far off from his baseline range. To ensure potassium not worsening, labs were rechecked on 6/28 and results just obtained. Potassium 5.4. AS OF NOTE, reference range for epic labs note potassium range to be 3.5-5.3. This is only marginally elevated and should not be a contraindication with conscious sedation. Hgb stable at 9.6.   However, please have labs sent to his PCP ASAP for any further recommendations.

## 2014-07-13 ENCOUNTER — Encounter (HOSPITAL_COMMUNITY): Payer: Self-pay | Admitting: Physical Therapy

## 2014-07-18 ENCOUNTER — Ambulatory Visit (HOSPITAL_COMMUNITY): Payer: Medicare Other | Attending: Family Medicine

## 2014-07-18 DIAGNOSIS — R29898 Other symptoms and signs involving the musculoskeletal system: Secondary | ICD-10-CM | POA: Diagnosis present

## 2014-07-18 DIAGNOSIS — M25611 Stiffness of right shoulder, not elsewhere classified: Secondary | ICD-10-CM | POA: Diagnosis present

## 2014-07-18 DIAGNOSIS — R2681 Unsteadiness on feet: Secondary | ICD-10-CM | POA: Insufficient documentation

## 2014-07-18 DIAGNOSIS — M6289 Other specified disorders of muscle: Secondary | ICD-10-CM | POA: Insufficient documentation

## 2014-07-18 DIAGNOSIS — R2689 Other abnormalities of gait and mobility: Secondary | ICD-10-CM | POA: Insufficient documentation

## 2014-07-18 DIAGNOSIS — M25612 Stiffness of left shoulder, not elsewhere classified: Secondary | ICD-10-CM | POA: Diagnosis present

## 2014-07-18 DIAGNOSIS — R269 Unspecified abnormalities of gait and mobility: Secondary | ICD-10-CM | POA: Diagnosis present

## 2014-07-18 DIAGNOSIS — Z9181 History of falling: Secondary | ICD-10-CM | POA: Diagnosis present

## 2014-07-18 DIAGNOSIS — Z7409 Other reduced mobility: Secondary | ICD-10-CM | POA: Diagnosis present

## 2014-07-18 DIAGNOSIS — M6281 Muscle weakness (generalized): Secondary | ICD-10-CM

## 2014-07-18 DIAGNOSIS — R293 Abnormal posture: Secondary | ICD-10-CM | POA: Insufficient documentation

## 2014-07-18 DIAGNOSIS — M629 Disorder of muscle, unspecified: Secondary | ICD-10-CM | POA: Diagnosis present

## 2014-07-18 NOTE — Therapy (Signed)
[facility] [address] [place], [place], [address] Phone: [phone]   Fax:  [phone]  Physical Therapy Treatment  Patient Details  Name: James Park MRN: [id number] Date of Birth: 06/10/1929 Referring Provider:  Eartha InchBadger, Michael C, MD  Encounter Date: 07/18/2014      PT End of Session - 07/18/14 1358    Visit Number 9   Number of Visits 16   Date for PT Re-Evaluation 08/03/14   Authorization Type BCBS Medicare Advantage    Authorization Time Period 06/06/14 to 08/06/14; G-code done 7th visit    Authorization - Visit Number 9   Authorization - Number of Visits 17   PT Start Time 1349   PT Stop Time 1436   PT Time Calculation (min) 47 min   Equipment Utilized During Treatment Gait belt   Activity Tolerance Patient tolerated treatment well   Behavior During Therapy [facility] for tasks assessed/performed      Past Medical History  Diagnosis Date  . Atrial fibrillation 02/2011    Not anticoagulated  . Right bundle branch block   . Essential hypertension, benign   . Anxiety   . Depression   . Constipation   . GERD (gastroesophageal reflux disease)   . Arthritis   . Urinary retention     Self-catheterization when necessary  . Rotator cuff tear     Right - not repaired  . Stroke 05/2012    Gait disturbance only residual - hemorrhagic stroke  . CKD (chronic kidney disease) stage 5, GFR less than 15 ml/min     Previously on hemodialysis  . Pulmonary hypertension     Severe  . Chronotropic incompetence     Suspected based on GXT  . PUD (peptic ulcer disease)     In his late 5950s    Past Surgical History  Procedure Laterality Date  . Knee surgery      Tendon repair  . Eye surgery Bilateral     Cataract  . Insertion of dialysis catheter Right 06/30/2012    Procedure: INSERTION OF DIALYSIS CATHETER right IJ;  Surgeon: Larina Earthlyodd F Early, MD;  [facility];  Service: Vascular;  Laterality: Right;  . Tonsillectomy    .  Appendectomy      There were no vitals filed for this visit.  Visit Diagnosis:  History of fall  Poor balance  Poor posture  Proximal muscle weakness  Abnormality of gait  Impaired functional mobility and activity tolerance  Unsteadiness      Subjective Assessment - 07/18/14 1353    Subjective Pt and caregiver reported recent fall while trying to sit on computer chair this past weekend, has open wound on Lt arm.  No reports of pain   Currently in Pain? No/denies                         [facility] - 07/18/14 0001    Exercises   Exercises Knee/Hip   Knee/Hip Exercises: Aerobic   [place] level 3; resistance 3 x 10min; pt encouraged to increase frequency using at home   Knee/Hip Exercises: Standing   Heel Raises 20 reps   Heel Raises Limitations Toe raises 20x   Forward Lunges Both;15 reps   Forward Lunges Limitations to 4" box without UE assist   Side Lunges Both;10 reps   Side Lunges Limitations 4in step   Hip Abduction Both;15 reps   Abduction Limitations Hip abduction 15x  Bil 3#   Functional Squat 2 sets;10 reps   Functional Squat Limitations 10 STS no HHA   Gait Training 226 with emphasis on increasing stride length and heel to toe; 4in hurdles 2RT inside // bars   Other Standing Knee Exercises Tandem stance 2x 30", Tandem and retro gait 2RT; shoulder and hip wall bumps 10x each   Other Standing Knee Exercises --                  PT Short Term Goals - 07/18/14 1827    PT SHORT TERM GOAL #1   Title Patient will demonstrate a score of at least 42 on Berg balance test in order to reduce overall fall risk    Status On-going   PT SHORT TERM GOAL #2   Title Patient will demonstrate at least 4-/5 proximal muscle strength and 5/5 strength in bilateral lower extremities    Status On-going   PT SHORT TERM GOAL #3   Title Patient will demonstrate at least 30 degrees bilateral hip IR and 45 degrees bilateral hip ER     Status On-going   PT SHORT TERM GOAL #4   Title Patient will demonstrate improved functional activity tolerance as evidenced by an ability to ambulate at least 67ft during 6 minute walk test with no assistive device    Status Achieved           PT Long Term Goals - 07/18/14 1827    PT LONG TERM GOAL #1   Title Patient will be independent in correctly and consistently performing appropriate HEP    Status On-going   PT LONG TERM GOAL #2   Title Patient will demonstrate reduced fall risk as evidenced by an increase on Berg balance scale to at least 48 points    Status On-going   PT LONG TERM GOAL #3   Title Patient will demonstrate improved gait mechanics, including improved pelvic and trunk rotation, symmetrical bilateral arm swing, improved step lengths with improved TKE, improved foot clearance during gait, and improved gait speed    Status On-going   PT LONG TERM GOAL #4   Title Patient will demonstrate improved functional activity tolerance as evidenced by an ability to ambulate at least 838ft during 6 minute walk test with no fatigue and no assistive device    PT LONG TERM GOAL #5   Title Patient will demonstrate improved balance reaction skills and safety awareness as evidenced by successful navigation of obstacle courses on even and uneven surfaces and only supervision from therapist    PT LONG TERM GOAL #6   Title Patient to report that he has experienced no falls in or outside of his home within the past 8 weeks                Plan - 07/18/14 1822    Clinical Impression Statement Reviewed importance of increaseing spatial awareness and importance of UE and LE placement prior sit to stands following reports of falls last week.  Session focus on improving gauit mechanics, balance and functional strenghtening.  Added hurdles to increase hip flexion and stride length with gait wiht HHA required for unsteadiness due to weakness.  Gait training with min guard and cueing for  heel to toe pattern, imporve posture, increase stride length to reduce shuffleing.  Min assistance required for LOB epsiodes during balance training.  No reports of pain through session, pt was fatigued at end of session.     PT Next Visit Plan Functional  strength and balance, activity tolerance.         Problem List Patient Active Problem List   Diagnosis Date Noted  . Melena 06/30/2014  . Anemia 06/30/2014  . Heme + stool 06/30/2014  . Constipation 11/30/2013  . Loss of weight 11/30/2013  . Abnormal CT scan, colon 11/30/2013  . Pulmonary hypertension 05/20/2013  . SSS (sick sinus syndrome) 05/06/2013  . CKD (chronic kidney disease) stage 5, GFR less than 15 ml/min 05/06/2013  . Essential hypertension, benign 06/11/2012  . Right bundle branch block   . Atrial fibrillation 02/14/2011   Juel Burrow, PTA  Juel Burrow 07/18/2014, 6:31 PM  [place] [facility] [address] [place], [place], [address] Phone: [phone]   Fax:  [phone]

## 2014-07-20 ENCOUNTER — Ambulatory Visit (HOSPITAL_COMMUNITY): Payer: Medicare Other

## 2014-07-20 DIAGNOSIS — R269 Unspecified abnormalities of gait and mobility: Secondary | ICD-10-CM

## 2014-07-20 DIAGNOSIS — R2689 Other abnormalities of gait and mobility: Secondary | ICD-10-CM

## 2014-07-20 DIAGNOSIS — R2681 Unsteadiness on feet: Secondary | ICD-10-CM

## 2014-07-20 DIAGNOSIS — R293 Abnormal posture: Secondary | ICD-10-CM

## 2014-07-20 DIAGNOSIS — M6281 Muscle weakness (generalized): Secondary | ICD-10-CM

## 2014-07-20 DIAGNOSIS — Z9181 History of falling: Secondary | ICD-10-CM | POA: Diagnosis not present

## 2014-07-20 DIAGNOSIS — Z7409 Other reduced mobility: Secondary | ICD-10-CM

## 2014-07-20 NOTE — Therapy (Signed)
[place], [place], [address] Phone: [phone]   Fax:  [phone]  Physical Therapy Treatment  Patient Details  Name: James Park MRN: [id number] Date of Birth: 07-28-1929 Referring Provider:  Chesley Noon, MD  Encounter Date: 07/20/2014      PT End of Session - 07/20/14 1432    Visit Number 10   Number of Visits 16   Date for PT Re-Evaluation 08/03/14   Authorization Type BCBS Medicare Advantage    Authorization Time Period 06/06/14 to 08/06/14; G-code done 7th visit    Authorization - Visit Number 10   Authorization - Number of Visits 17   PT Start Time 7494   PT Stop Time 1430   PT Time Calculation (min) 43 min   Equipment Utilized During Treatment Gait belt   Activity Tolerance Patient limited by fatigue;Patient tolerated treatment well   Behavior During Therapy [facility] for tasks assessed/performed      Past Medical History  Diagnosis Date  . Atrial fibrillation 02/2011    Not anticoagulated  . Right bundle branch block   . Essential hypertension, benign   . Anxiety   . Depression   . Constipation   . GERD (gastroesophageal reflux disease)   . Arthritis   . Urinary retention     Self-catheterization when necessary  . Rotator cuff tear     Right - not repaired  . Stroke 05/2012    Gait disturbance only residual - hemorrhagic stroke  . CKD (chronic kidney disease) stage 5, GFR less than 15 ml/min     Previously on hemodialysis  . Pulmonary hypertension     Severe  . Chronotropic incompetence     Suspected based on GXT  . PUD (peptic ulcer disease)     In his late 70s    Past Surgical History  Procedure Laterality Date  . Knee surgery      Tendon repair  . Eye surgery Bilateral     Cataract  . Insertion of dialysis catheter Right 06/30/2012    Procedure: INSERTION OF DIALYSIS CATHETER right IJ;  Surgeon: Rosetta Posner, MD;  Location: [facility];  Service: Vascular;  Laterality: Right;   . Tonsillectomy    . Appendectomy      There were no vitals filed for this visit.  Visit Diagnosis:  History of fall  Poor balance  Poor posture  Proximal muscle weakness  Abnormality of gait  Impaired functional mobility and activity tolerance  Unsteadiness      Subjective Assessment - 07/20/14 1355    Subjective Pt and caregiver stated pt. exhausted for whole day folllowing PT session last session.  No reports of pain today   Currently in Pain? No/denies            [facility] PT Assessment - 07/20/14 0001    Ambulation/Gait   Ambulation/Gait Yes   Ambulation/Gait Assistance 5: Supervision   Ambulation/Gait Assistance Details 618   6 Minute Walk- Baseline   6 Minute Walk- Baseline yes             [facility] Adult PT Treatment/Exercise - 07/20/14 0001    Knee/Hip Exercises: Aerobic   Nustep Unavailable/ held due to fatigue and BP   Knee/Hip Exercises: Standing   Heel Raises 20 reps   Heel Raises Limitations Toe raises 20x 5"   Functional Squat 2 sets;10 reps   Functional Squat Limitations 10 STS no HHA   Gait Training 6 min walk  618 ft SBA   Other Standing Knee Exercises Tandem stance 2x 30", tandem and retro 2RT; hurdles 6 and 12 in alternating   Other Standing Knee Exercises sidestepping on balance beam 2 beams stopped due to c/o lightheadedness              PT Short Term Goals - 07/20/14 1357    PT SHORT TERM GOAL #1   Title Patient will demonstrate a score of at least 42 on Berg balance test in order to reduce overall fall risk    Status On-going   PT SHORT TERM GOAL #2   Title Patient will demonstrate at least 4-/5 proximal muscle strength and 5/5 strength in bilateral lower extremities    Status On-going   PT SHORT TERM GOAL #3   Title Patient will demonstrate at least 30 degrees bilateral hip IR and 45 degrees bilateral hip ER    PT SHORT TERM GOAL #4   Title Patient will demonstrate improved functional activity tolerance as evidenced by an  ability to ambulate at least 611f during 6 minute walk test with no assistive device            PT Long Term Goals - 07/20/14 1357    PT LONG TERM GOAL #1   Title Patient will be independent in correctly and consistently performing appropriate HEP    Status On-going   PT LONG TERM GOAL #2   Title Patient will demonstrate reduced fall risk as evidenced by an increase on Berg balance scale to at least 48 points    PT LONG TERM GOAL #3   Title Patient will demonstrate improved gait mechanics, including improved pelvic and trunk rotation, symmetrical bilateral arm swing, improved step lengths with improved TKE, improved foot clearance during gait, and improved gait speed    Status On-going   PT LONG TERM GOAL #4   Title Patient will demonstrate improved functional activity tolerance as evidenced by an ability to ambulate at least 8520fduring 6 minute walk test with no fatigue and no assistive device    Status On-going   PT LONG TERM GOAL #5   Status On-going   PT LONG TERM GOAL #6   Title Patient to report that he has experienced no falls in or outside of his home within the past 8 weeks    Status Not Met               Plan - 07/20/14 1449    Clinical Impression Statement Session focus on improving gait training incorporating strengthening and balance.  6 minute walk at beginning of session with ability to ambulate for 618 feet with no AD, cueing to increase stride length and heel to toe pattern to improve mechanics and energy conservation.  Increased height with hurdles alternating to improve hip flexion strength, increase stride length and improve SLS, HHA was required due to unsteadiness due to weakness and fear of falling.  Changed to dynamic surface with sidestepping, pt c/o lightheadedness, exercises stopped and pt had a seat with water.  BP checked with 155/54 mmHg HR48, discussion held about MD awareness of BP with medications.  Pt very fatigued at end of session, no reports  of pain. 321    PT Next Visit Plan Functional strength and balance, activity tolerance.         Problem List Patient Active Problem List   Diagnosis Date Noted  . Melena 06/30/2014  . Anemia 06/30/2014  . Heme + stool 06/30/2014  . Constipation  11/30/2013  . Loss of weight 11/30/2013  . Abnormal CT scan, colon 11/30/2013  . Pulmonary hypertension 05/20/2013  . SSS (sick sinus syndrome) 05/06/2013  . CKD (chronic kidney disease) stage 5, GFR less than 15 ml/min 05/06/2013  . Essential hypertension, benign 06/11/2012  . Right bundle branch block   . Atrial fibrillation 02/14/2011   Ihor Austin, [place]; [place]  Aldona Lento 07/20/2014, 3:28 PM  [place] [address] [place], [place], [address] Phone: [phone]   Fax:  [phone]

## 2014-07-25 ENCOUNTER — Encounter: Payer: Self-pay | Admitting: Internal Medicine

## 2014-07-25 ENCOUNTER — Ambulatory Visit (HOSPITAL_COMMUNITY): Payer: Medicare Other

## 2014-07-25 ENCOUNTER — Encounter (HOSPITAL_COMMUNITY): Payer: Self-pay

## 2014-07-25 DIAGNOSIS — R29898 Other symptoms and signs involving the musculoskeletal system: Secondary | ICD-10-CM

## 2014-07-25 DIAGNOSIS — Z9181 History of falling: Secondary | ICD-10-CM | POA: Diagnosis not present

## 2014-07-25 DIAGNOSIS — M6281 Muscle weakness (generalized): Secondary | ICD-10-CM

## 2014-07-25 DIAGNOSIS — M6289 Other specified disorders of muscle: Secondary | ICD-10-CM

## 2014-07-25 DIAGNOSIS — M25611 Stiffness of right shoulder, not elsewhere classified: Secondary | ICD-10-CM

## 2014-07-25 DIAGNOSIS — M25612 Stiffness of left shoulder, not elsewhere classified: Secondary | ICD-10-CM

## 2014-07-25 DIAGNOSIS — Z7409 Other reduced mobility: Secondary | ICD-10-CM

## 2014-07-25 DIAGNOSIS — R2689 Other abnormalities of gait and mobility: Secondary | ICD-10-CM

## 2014-07-25 DIAGNOSIS — R269 Unspecified abnormalities of gait and mobility: Secondary | ICD-10-CM

## 2014-07-25 DIAGNOSIS — M629 Disorder of muscle, unspecified: Secondary | ICD-10-CM

## 2014-07-25 DIAGNOSIS — R293 Abnormal posture: Secondary | ICD-10-CM

## 2014-07-25 DIAGNOSIS — R2681 Unsteadiness on feet: Secondary | ICD-10-CM

## 2014-07-25 NOTE — Therapy (Signed)
[facility] [address] [place], [place], [address] Phone: [phone]   Fax:  [phone]  Occupational Therapy Evaluation  Patient Details  Name: James Park MRN: [id number] Date of Birth: Jul 24, 1929 Referring Provider:  Eartha Inch, MD  Encounter Date: 07/25/2014      OT End of Session - 07/25/14 1603    Visit Number 1   Number of Visits 8   Date for OT Re-Evaluation 09/23/14  mini reassessment: 08/22/14   Authorization Type BCBS Medicare   Authorization Time Period before 10th visit   Authorization - Visit Number 1   Authorization - Number of Visits 10   OT Start Time 1435   OT Stop Time 1517   OT Time Calculation (min) 42 min   Activity Tolerance Patient tolerated treatment well   Behavior During Therapy [facility] for tasks assessed/performed      Past Medical History  Diagnosis Date  . Atrial fibrillation 02/2011    Not anticoagulated  . Right bundle branch block   . Essential hypertension, benign   . Anxiety   . Depression   . Constipation   . GERD (gastroesophageal reflux disease)   . Arthritis   . Urinary retention     Self-catheterization when necessary  . Rotator cuff tear     Right - not repaired  . Stroke 05/2012    Gait disturbance only residual - hemorrhagic stroke  . CKD (chronic kidney disease) stage 5, GFR less than 15 ml/min     Previously on hemodialysis  . Pulmonary hypertension     Severe  . Chronotropic incompetence     Suspected based on GXT  . PUD (peptic ulcer disease)     In his late 40s    Past Surgical History  Procedure Laterality Date  . Knee surgery      Tendon repair  . Eye surgery Bilateral     Cataract  . Insertion of dialysis catheter Right 06/30/2012    Procedure: INSERTION OF DIALYSIS CATHETER right IJ;  Surgeon: Larina Earthly, MD;  Location: [facility] OR;  Service: Vascular;  Laterality: Right;  . Tonsillectomy    . Appendectomy      There were no vitals filed for this  visit.  Visit Diagnosis:  Shoulder weakness - Plan: Ot plan of care cert/re-cert  Shoulder stiffness, left - Plan: Ot plan of care cert/re-cert  Shoulder stiffness, right - Plan: Ot plan of care cert/re-cert  Tight fascia - Plan: Ot plan of care cert/re-cert      Subjective Assessment - 07/25/14 1435    Subjective  S: I can only raise them so far.    Pertinent History Patient is a 81 y/o male presenting to occupational therapy for bilateral shoulder weakness and stiffness. Pt states that problem began shortly after his CVA in 2013. Pt reports no pain or no known injury. During chart review there was a X-Ray taken of the right shoulder s/p fall in 2013 with the findings of DJD. Dr. Cyndia Bent has refered patient to occupational therapy for occupational therapy.    Special Tests FOTO to be completed at a later date.   Patient Stated Goals To be able to drive with less difficulty.    Currently in Pain? No/denies           [facility] OT Assessment - 07/25/14 1435    Assessment   Diagnosis bilateral shoulder pain and stiffness   Onset Date --  2 years ago after CVA  Precautions   Precautions Fall   Restrictions   Weight Bearing Restrictions No   Balance Screen   Has the patient fallen in the past 6 months Yes   How many times? 3   Has the patient had a decrease in activity level because of a fear of falling?  No   Is the patient reluctant to leave their home because of a fear of falling?  No   Home  Environment   Family/patient expects to be discharged to: Private residence   Lives With Alone   Prior Function   Level of Independence Independent with basic ADLs   Vocation Retired   Leisure Pt has been going [employer] since 1984   ADL   ADL comments Difficulty driving , donning/doffing shirts, reaching up into cabinets.    Mobility   Mobility Status History of falls   Written Expression   Dominant Hand Right   Vision - History   Baseline Vision No visual deficits   Cognition    Overall Cognitive Status Within Functional Limits for tasks assessed   ROM / Strength   AROM / PROM / Strength AROM;PROM;Strength   Palpation   Palpation comment Mod fascial restrictions in bilateral upper arms, trapezius, and scapularis region.    AROM   Overall AROM Comments Assessed seated. IR/ER adducted.   AROM Assessment Site Shoulder   Right/Left Shoulder Right;Left   Right Shoulder Flexion 80 Degrees   Right Shoulder ABduction 70 Degrees   Right Shoulder Internal Rotation 78 Degrees   Right Shoulder External Rotation 60 Degrees   Left Shoulder Flexion 75 Degrees   Left Shoulder ABduction 67 Degrees   Left Shoulder Internal Rotation 90 Degrees   Left Shoulder External Rotation 45 Degrees   PROM   Overall PROM Comments Assessed supine. IR/ER adducted.   PROM Assessment Site Shoulder   Right/Left Shoulder Right;Left   Right Shoulder Flexion 112 Degrees   Right Shoulder ABduction 112 Degrees   Right Shoulder Internal Rotation 90 Degrees   Right Shoulder External Rotation 86 Degrees   Left Shoulder Flexion 130 Degrees   Left Shoulder ABduction 100 Degrees   Left Shoulder Internal Rotation 90 Degrees   Left Shoulder External Rotation 55 Degrees   Strength   Strength Assessment Site Shoulder   Right/Left Shoulder Right;Left   Right Shoulder Flexion 3-/5   Right Shoulder ABduction 3-/5   Right Shoulder Internal Rotation 3/5   Right Shoulder External Rotation 3-/5   Left Shoulder Flexion 3-/5   Left Shoulder ABduction 3-/5   Left Shoulder Internal Rotation 3/5   Left Shoulder External Rotation 3-/5                         OT Education - 07/25/14 1603    Education provided Yes   Education Details Table slides   Person(s) Educated Patient   Methods Explanation;Demonstration;Handout   Comprehension Verbalized understanding;Returned demonstration          OT Short Term Goals - 07/25/14 1611    OT SHORT TERM GOAL #1   Title Patient will be educated  and independent with HEP.   Time 4   Period Weeks   Status New   OT SHORT TERM GOAL #2   Title Patient will return to highest level of independence with all basic daily and leisure tasks.   Time 4   Period Weeks   Status New   OT SHORT TERM GOAL #3   Title Patient will  increase AROM to [facility] to increase ability to reach into overhead cabinets with less difficulty.    Time 4   Period Weeks   Status New   OT SHORT TERM GOAL #4   Title Patient will decrease fascial restrictions to min amount to increase comfort level in BUE shoulder if driving.    Time 4   Period Weeks   Status New   OT SHORT TERM GOAL #5   Title Patient will increase BUE strength to 4-/5 to increase ability to complete daily tasks with less difficulty.                   Plan - 07/31/14 1606    Clinical Impression Statement A: Patient is a 81 y/o male s/p bilateral shoulder weakness and pain causing increased fascial restrictions and decreased strength and ROM resulting in difficulty completing daily tasks using BUE. Due to not having a clear diagnosis on what happening to his BUE's I discussed the possiblity of degenerative joint disease that is present in RUE may also be present in his LUE. There is also a possibility of frozen shoulder although during passive stretching I was able to stretch further that he was able to which is a good sign. There is a lot of joint instability in his shoulders and crepitation with passive stretching.    Pt will benefit from skilled therapeutic intervention in order to improve on the following deficits (Retired) Decreased range of motion   Rehab Potential Good   OT Frequency 2x / week   OT Duration 4 weeks   OT Treatment/Interventions Self-care/ADL training;Therapeutic exercise;Patient/family education;Manual Therapy;Neuromuscular education;Ultrasound;Therapeutic activities;Cryotherapy;DME and/or AE instruction;Electrical Stimulation;Moist Heat;Passive range of motion   Plan P:  Pt will benefit from skilled OT services to increase functional performance during daily tasks using BUE. Treatment Plan: myofascial release, muscle energy technique, passive stretching, AAROM, AROM, general strengthening, functional reaching activities. scapular and shoulder stability exercises.    Consulted and Agree with Plan of Care Patient          G-Codes - 07/31/14 1633    Functional Assessment Tool Used clinical judgement   Functional Limitation Carrying, moving and handling objects   Carrying, Moving and Handling Objects Current Status [phone]) At least 60 percent but less than 80 percent impaired, limited or restricted   Carrying, Moving and Handling Objects Goal Status (G9562) At least 20 percent but less than 40 percent impaired, limited or restricted      Problem List Patient Active Problem List   Diagnosis Date Noted  . Melena 06/30/2014  . Anemia 06/30/2014  . Heme + stool 06/30/2014  . Constipation 11/30/2013  . Loss of weight 11/30/2013  . Abnormal CT scan, colon 11/30/2013  . Pulmonary hypertension 05/20/2013  . SSS (sick sinus syndrome) 05/06/2013  . CKD (chronic kidney disease) stage 5, GFR less than 15 ml/min 05/06/2013  . Essential hypertension, benign 06/11/2012  . Right bundle branch block   . Atrial fibrillation 02/14/2011    Limmie Patricia, OTR/L,CBIS  [phone]  2014/07/31, 5:02 PM  [facility] [address] [place], [place], [address] Phone: [phone]   Fax:  [phone]

## 2014-07-25 NOTE — Therapy (Signed)
[facility] [address] [place], [place], [address] Phone: [phone]   Fax:  [phone]  Physical Therapy Treatment  Patient Details  Name: James BeringBenjamin R Christo MRN: [id number] Date of Birth: 10/23/1929 Referring Provider:  Eartha InchBadger, Michael C, MD  Encounter Date: 07/25/2014      PT End of Session - 07/25/14 1436    Visit Number 11   Number of Visits 16   Date for PT Re-Evaluation 08/03/14   Authorization Type BCBS Medicare Advantage    Authorization Time Period 06/06/14 to 08/06/14; G-code done 7th visit    Authorization - Visit Number 11   Authorization - Number of Visits 17   PT Start Time 1347   PT Stop Time 1430   PT Time Calculation (min) 43 min   Equipment Utilized During Treatment Gait belt   Activity Tolerance Patient limited by fatigue;Patient tolerated treatment well   Behavior During Therapy [facility] for tasks assessed/performed      Past Medical History  Diagnosis Date  . Atrial fibrillation 02/2011    Not anticoagulated  . Right bundle branch block   . Essential hypertension, benign   . Anxiety   . Depression   . Constipation   . GERD (gastroesophageal reflux disease)   . Arthritis   . Urinary retention     Self-catheterization when necessary  . Rotator cuff tear     Right - not repaired  . Stroke 05/2012    Gait disturbance only residual - hemorrhagic stroke  . CKD (chronic kidney disease) stage 5, GFR less than 15 ml/min     Previously on hemodialysis  . Pulmonary hypertension     Severe  . Chronotropic incompetence     Suspected based on GXT  . PUD (peptic ulcer disease)     In his late 2150s    Past Surgical History  Procedure Laterality Date  . Knee surgery      Tendon repair  . Eye surgery Bilateral     Cataract  . Insertion of dialysis catheter Right 06/30/2012    Procedure: INSERTION OF DIALYSIS CATHETER right IJ;  Surgeon: Larina Earthlyodd F Early, MD;  [facility];  Service: Vascular;  Laterality: Right;   . Tonsillectomy    . Appendectomy      There were no vitals filed for this visit.  Visit Diagnosis:  History of fall  Poor balance  Poor posture  Proximal muscle weakness  Abnormality of gait  Impaired functional mobility and activity tolerance  Unsteadiness      Subjective Assessment - 07/25/14 1351    Subjective Pt stated he has no pain and no reports of recent fall, continues to complain of feeling sleepy and decreased energy.   Currently in Pain? No/denies              [facility] Treatment/Exercise - 07/25/14 0001    Knee/Hip Exercises: Aerobic   Nustep Unavailable   Knee/Hip Exercises: Standing   Heel Raises 20 reps   Heel Raises Limitations Toe raises 20x 5"   Functional Squat 2 sets;10 reps   Functional Squat Limitations 10 STS no HHA   Other Standing Knee Exercises Tandem stance 2x 30", tandem and retro 2RT; hurdles 6 and 12 in alternating   Other Standing Knee Exercises sidestepping 2RT RTB   Knee/Hip Exercises: Seated   Other Seated Knee/Hip Exercises sit to stand avg chair 10 reps without UE's   Knee/Hip Exercises: Sidelying   Hip ABduction Both;10 reps  PT Short Term Goals - 07/25/14 1436    PT SHORT TERM GOAL #1   Title Patient will demonstrate a score of at least 42 on Berg balance test in order to reduce overall fall risk    Status On-going   PT SHORT TERM GOAL #2   Title Patient will demonstrate at least 4-/5 proximal muscle strength and 5/5 strength in bilateral lower extremities    Status On-going   PT SHORT TERM GOAL #3   Title Patient will demonstrate at least 30 degrees bilateral hip IR and 45 degrees bilateral hip ER    Status On-going   PT SHORT TERM GOAL #4   Title Patient will demonstrate improved functional activity tolerance as evidenced by an ability to ambulate at least 667ft during 6 minute walk test with no assistive device    Status Achieved           PT Long Term Goals - 07/25/14 1440    PT LONG  TERM GOAL #1   Title Patient will be independent in correctly and consistently performing appropriate HEP    Status On-going   PT LONG TERM GOAL #2   Title Patient will demonstrate reduced fall risk as evidenced by an increase on Berg balance scale to at least 48 points    Status On-going   PT LONG TERM GOAL #3   Title Patient will demonstrate improved gait mechanics, including improved pelvic and trunk rotation, symmetrical bilateral arm swing, improved step lengths with improved TKE, improved foot clearance during gait, and improved gait speed    Status On-going   PT LONG TERM GOAL #4   Title Patient will demonstrate improved functional activity tolerance as evidenced by an ability to ambulate at least 862ft during 6 minute walk test with no fatigue and no assistive device    Status On-going   PT LONG TERM GOAL #5   Title Patient will demonstrate improved balance reaction skills and safety awareness as evidenced by successful navigation of obstacle courses on even and uneven surfaces and only supervision from therapist    Status On-going   PT LONG TERM GOAL #6   Title Patient to report that he has experienced no falls in or outside of his home within the past 8 weeks                Plan - 07/25/14 1833    Clinical Impression Statement Session focus on improving functional strengthening and balance.  Added glut med strengthenig exercises to assist with balance, pt was able to sidestep today with theraband resistance.  Therapist facilitation to improve form with sidelying abduction due to weakness.  Pt was able to complete sit to stands no HHA from lowest mat height, cueing for technique to increase ease.  Pt continues to be limited by fatigue, standing rest breaks only this session to improve activity tolerance.  No reports of pain through session.     PT Next Visit Plan Functional strength and balance, activity tolerance.         Problem List Patient Active Problem List    Diagnosis Date Noted  . Melena 06/30/2014  . Anemia 06/30/2014  . Heme + stool 06/30/2014  . Constipation 11/30/2013  . Loss of weight 11/30/2013  . Abnormal CT scan, colon 11/30/2013  . Pulmonary hypertension 05/20/2013  . SSS (sick sinus syndrome) 05/06/2013  . CKD (chronic kidney disease) stage 5, GFR less than 15 ml/min 05/06/2013  . Essential hypertension, benign 06/11/2012  . Right bundle  branch block   . Atrial fibrillation 02/14/2011   Becky Sax, LPTA; CBIS [phone]   Juel Burrow 07/25/2014, 6:38 PM  [facility] [address] [place], [place], [address] Phone: [phone]   Fax:  [phone]

## 2014-07-25 NOTE — Patient Instructions (Signed)
Spend 1-2 minutes on each exercise. 2-3 times a day.   SHOULDER: Flexion On Table   Place hands on table, elbows straight. Move hips away from body. Press hands down into table.   Abduction (Passive)   With arm out to side, resting on table, lower head toward arm, keeping trunk away from table.  Copyright  VHI. All rights reserved.     Internal Rotation (Assistive)   Seated with elbow bent at right angle and held against side, slide arm on table surface in an inward arc.  Activity: Use this motion to brush crumbs off the table.  Copyright  VHI. All rights reserved.

## 2014-07-27 ENCOUNTER — Ambulatory Visit (HOSPITAL_COMMUNITY): Payer: Medicare Other

## 2014-08-01 ENCOUNTER — Ambulatory Visit (HOSPITAL_COMMUNITY): Payer: Medicare Other

## 2014-08-01 ENCOUNTER — Encounter (HOSPITAL_COMMUNITY): Payer: Self-pay

## 2014-08-01 ENCOUNTER — Ambulatory Visit: Payer: Self-pay | Admitting: Internal Medicine

## 2014-08-01 ENCOUNTER — Encounter (HOSPITAL_COMMUNITY): Payer: Self-pay | Admitting: Occupational Therapy

## 2014-08-01 DIAGNOSIS — M6289 Other specified disorders of muscle: Secondary | ICD-10-CM

## 2014-08-01 DIAGNOSIS — Z9181 History of falling: Secondary | ICD-10-CM | POA: Diagnosis not present

## 2014-08-01 DIAGNOSIS — M25611 Stiffness of right shoulder, not elsewhere classified: Secondary | ICD-10-CM

## 2014-08-01 DIAGNOSIS — R29898 Other symptoms and signs involving the musculoskeletal system: Secondary | ICD-10-CM

## 2014-08-01 DIAGNOSIS — M629 Disorder of muscle, unspecified: Secondary | ICD-10-CM

## 2014-08-01 DIAGNOSIS — M25612 Stiffness of left shoulder, not elsewhere classified: Secondary | ICD-10-CM

## 2014-08-01 NOTE — Therapy (Signed)
[facility] [address] [place], [place], [address] Phone: [phone]   Fax:  [phone]  Occupational Therapy Treatment  Patient Details  Name: James BeringBenjamin R Mcdonell MRN: [id number] Date of Birth: 05/02/1929 Referring Provider:  Eartha InchBadger, Michael C, MD  Encounter Date: 08/01/2014      OT End of Session - 08/01/14 1500    Visit Number 2   Number of Visits 8   Date for OT Re-Evaluation 09/23/14  mini reassessment: 08/22/14   Authorization Type BCBS Medicare   Authorization Time Period before 10th visit   Authorization - Visit Number 2   Authorization - Number of Visits 10   OT Start Time 1350   OT Stop Time 1440   OT Time Calculation (min) 50 min   Activity Tolerance Patient tolerated treatment well   Behavior During Therapy [facility] for tasks assessed/performed      Past Medical History  Diagnosis Date  . Atrial fibrillation 02/2011    Not anticoagulated  . Right bundle branch block   . Essential hypertension, benign   . Anxiety   . Depression   . Constipation   . GERD (gastroesophageal reflux disease)   . Arthritis   . Urinary retention     Self-catheterization when necessary  . Rotator cuff tear     Right - not repaired  . Stroke 05/2012    Gait disturbance only residual - hemorrhagic stroke  . CKD (chronic kidney disease) stage 5, GFR less than 15 ml/min     Previously on hemodialysis  . Pulmonary hypertension     Severe  . Chronotropic incompetence     Suspected based on GXT  . PUD (peptic ulcer disease)     In his late 6550s    Past Surgical History  Procedure Laterality Date  . Knee surgery      Tendon repair  . Eye surgery Bilateral     Cataract  . Insertion of dialysis catheter Right 06/30/2012    Procedure: INSERTION OF DIALYSIS CATHETER right IJ;  Surgeon: Larina Earthlyodd F Early, MD;  [facility];  Service: Vascular;  Laterality: Right;  . Tonsillectomy    . Appendectomy      There were no vitals filed for this  visit.  Visit Diagnosis:  Shoulder weakness  Shoulder stiffness, left  Shoulder stiffness, right  Tight fascia      Subjective Assessment - 08/01/14 1423    Subjective  S: I didn't do my exercises.    Currently in Pain? No/denies            [facility] Assessment - 08/01/14 1427    Assessment   Diagnosis bilateral shoulder pain and stiffness   Precautions   Precautions Fall                  OT Treatments/Exercises (OP) - 08/01/14 1427    Exercises   Exercises Shoulder   Shoulder Exercises: Supine   Protraction PROM;10 reps;AAROM;5 reps;Limitations  physical assistance from therapist   Horizontal ABduction PROM;AAROM;5 reps;Limitations  Physical assistance from therapist   External Rotation PROM;10 reps   Internal Rotation PROM;10 reps   Flexion PROM;10 reps;AAROM;5 reps;Limitations  physical assistance from therapist   ABduction PROM;10 reps   Shoulder Exercises: Seated   Elevation AROM;10 reps   Extension AROM;10 reps   Row AROM;10 reps   Shoulder Exercises: Therapy Ball   Flexion 10 reps   ABduction 10 reps  each arm   Manual Therapy  Manual Therapy Myofascial release;Muscle Energy Technique   Myofascial Release Myofascial release to bilateral upper arms, trapezius, and scapularis region to decrease fascial restrictions and increase joint mobility in a pain free zone.    Muscle Energy Technique Muscle energy technique to right anterior deltoid to relax tone and muscle spasm and increase range of motion.                OT Education - 08/01/14 1505    Education provided Yes   Education Details Pt given print out of OT evaluation and goals reviewed.    Person(s) Educated Patient   Methods Explanation;Handout   Comprehension Verbalized understanding          OT Short Term Goals - 08/01/14 1504    OT SHORT TERM GOAL #1   Title Patient will be educated and independent with HEP.   Status On-going   OT SHORT TERM GOAL #2   Title  Patient will return to highest level of independence with all basic daily and leisure tasks.   Status On-going   OT SHORT TERM GOAL #3   Title Patient will increase AROM to [facility] to increase ability to reach into overhead cabinets with less difficulty.    Status On-going   OT SHORT TERM GOAL #4   Title Patient will decrease fascial restrictions to min amount to increase comfort level in BUE shoulder if driving.    Status On-going   OT SHORT TERM GOAL #5   Title Patient will increase BUE strength to 4-/5 to increase ability to complete daily tasks with less difficulty.    Status On-going                  Plan - 08/01/14 1501    Clinical Impression Statement A: pt with increased crepitation in right shoulder during passive stretching. Intitiated myofascial release, muscle energy technique, AAROM, and therapy ball exercises. Pt tolerated well with increased time and rest breaks. Pt states that she fell last night as he slid off his bed and his left arm hit something causing a large gash on the dorsal aspect of his left hand.  Two small abrasions located on left elbow as well.    Plan P: Cont to work on increasing independence with AAROM supine exercises. Add thumb tacks.        Problem List Patient Active Problem List   Diagnosis Date Noted  . Melena 06/30/2014  . Anemia 06/30/2014  . Heme + stool 06/30/2014  . Constipation 11/30/2013  . Loss of weight 11/30/2013  . Abnormal CT scan, colon 11/30/2013  . Pulmonary hypertension 05/20/2013  . SSS (sick sinus syndrome) 05/06/2013  . CKD (chronic kidney disease) stage 5, GFR less than 15 ml/min 05/06/2013  . Essential hypertension, benign 06/11/2012  . Right bundle branch block   . Atrial fibrillation 02/14/2011    Limmie Patricia, OTR/L,CBIS  [phone]  08/01/2014, 3:06 PM  [facility] [address] [place], [place], [address] Phone: [phone]   Fax:  [phone]

## 2014-08-03 ENCOUNTER — Encounter (HOSPITAL_COMMUNITY): Payer: Self-pay

## 2014-08-03 ENCOUNTER — Ambulatory Visit (HOSPITAL_COMMUNITY): Payer: Medicare Other

## 2014-08-03 DIAGNOSIS — R29898 Other symptoms and signs involving the musculoskeletal system: Secondary | ICD-10-CM

## 2014-08-03 DIAGNOSIS — M25612 Stiffness of left shoulder, not elsewhere classified: Secondary | ICD-10-CM

## 2014-08-03 DIAGNOSIS — M629 Disorder of muscle, unspecified: Secondary | ICD-10-CM

## 2014-08-03 DIAGNOSIS — M6289 Other specified disorders of muscle: Secondary | ICD-10-CM

## 2014-08-03 DIAGNOSIS — M25611 Stiffness of right shoulder, not elsewhere classified: Secondary | ICD-10-CM

## 2014-08-03 DIAGNOSIS — Z9181 History of falling: Secondary | ICD-10-CM | POA: Diagnosis not present

## 2014-08-03 NOTE — Therapy (Signed)
[facility] [address] [place], [place], [address] Phone: [phone]   Fax:  [phone]  Occupational Therapy Treatment  Patient Details  Name: James Park MRN: [id number] Date of Birth: 03-Jun-1929 Referring Provider:  Eartha Inch, MD  Encounter Date: 08/03/2014      OT End of Session - 08/03/14 1704    Visit Number 3   Number of Visits 8   Date for OT Re-Evaluation 09/23/14  mini reassessment: 08/22/14   Authorization Type BCBS Medicare   Authorization Time Period before 10th visit   Authorization - Visit Number 3   Authorization - Number of Visits 10   OT Start Time 1520   OT Stop Time 1601   OT Time Calculation (min) 41 min   Activity Tolerance Patient tolerated treatment well   Behavior During Therapy [facility] for tasks assessed/performed      Past Medical History  Diagnosis Date  . Atrial fibrillation 02/2011    Not anticoagulated  . Right bundle branch block   . Essential hypertension, benign   . Anxiety   . Depression   . Constipation   . GERD (gastroesophageal reflux disease)   . Arthritis   . Urinary retention     Self-catheterization when necessary  . Rotator cuff tear     Right - not repaired  . Stroke 05/2012    Gait disturbance only residual - hemorrhagic stroke  . CKD (chronic kidney disease) stage 5, GFR less than 15 ml/min     Previously on hemodialysis  . Pulmonary hypertension     Severe  . Chronotropic incompetence     Suspected based on GXT  . PUD (peptic ulcer disease)     In his late 14s    Past Surgical History  Procedure Laterality Date  . Knee surgery      Tendon repair  . Eye surgery Bilateral     Cataract  . Insertion of dialysis catheter Right 06/30/2012    Procedure: INSERTION OF DIALYSIS CATHETER right IJ;  Surgeon: Larina Earthly, MD;  Location: [facility] OR;  Service: Vascular;  Laterality: Right;  . Tonsillectomy    . Appendectomy      There were no vitals filed for this  visit.  Visit Diagnosis:  Shoulder weakness  Shoulder stiffness, left  Shoulder stiffness, right  Tight fascia      Subjective Assessment - 08/03/14 1558    Subjective  S: I'm sore.   Currently in Pain? No/denies                      OT Treatments/Exercises (OP) - 08/03/14 1545    Exercises   Exercises Shoulder   Shoulder Exercises: Supine   Protraction PROM;10 reps;AAROM;5 reps;Limitations   Protraction Limitations physical assistance from therapist with dowel   Horizontal ABduction PROM;10 reps;AAROM;5 reps;Limitations   Horizontal ABduction Limitations physical assistance from therapist with dowel   External Rotation PROM;10 reps   Internal Rotation PROM;10 reps   Flexion PROM;10 reps;AAROM;5 reps;Limitations   Flexion Limitations physical assistance from therapist with dowel   ABduction PROM;10 reps   Shoulder Exercises: Therapy Ball   Flexion 10 reps   ABduction 10 reps  each arm   Manual Therapy   Manual Therapy Myofascial release;Muscle Energy Technique   Myofascial Release Myofascial release to bilateral upper arms, trapezius, and scapularis region to decrease fascial restrictions and increase joint mobility in a pain free zone.    Muscle Energy Technique  Muscle energy technique to right anterior deltoid to relax tone and muscle spasm and increase range of motion.                  OT Short Term Goals - 08/01/14 1504    OT SHORT TERM GOAL #1   Title Patient will be educated and independent with HEP.   Status On-going   OT SHORT TERM GOAL #2   Title Patient will return to highest level of independence with all basic daily and leisure tasks.   Status On-going   OT SHORT TERM GOAL #3   Title Patient will increase AROM to [facility] to increase ability to reach into overhead cabinets with less difficulty.    Status On-going   OT SHORT TERM GOAL #4   Title Patient will decrease fascial restrictions to min amount to increase comfort level in BUE  shoulder if driving.    Status On-going   OT SHORT TERM GOAL #5   Title Patient will increase BUE strength to 4-/5 to increase ability to complete daily tasks with less difficulty.    Status On-going                  Plan - 08/03/14 1704    Clinical Impression Statement A: Pt continues to have increased crepitation in the right shoulder during passive stretching. Continues to require increased assistance with use of dowel rod exercises. Unable to add thumb tacks due to time constraint.    Plan P: Add thumb tacks and missed exercises.        Problem List Patient Active Problem List   Diagnosis Date Noted  . Melena 06/30/2014  . Anemia 06/30/2014  . Heme + stool 06/30/2014  . Constipation 11/30/2013  . Loss of weight 11/30/2013  . Abnormal CT scan, colon 11/30/2013  . Pulmonary hypertension 05/20/2013  . SSS (sick sinus syndrome) 05/06/2013  . CKD (chronic kidney disease) stage 5, GFR less than 15 ml/min 05/06/2013  . Essential hypertension, benign 06/11/2012  . Right bundle branch block   . Atrial fibrillation 02/14/2011    Limmie Patricia, OTR/L,CBIS  [phone]  08/03/2014, 5:06 PM  [facility] [address] [place], [place], [address] Phone: [phone]   Fax:  [phone]

## 2014-08-04 ENCOUNTER — Encounter (HOSPITAL_COMMUNITY): Payer: Self-pay | Admitting: Specialist

## 2014-08-08 ENCOUNTER — Encounter (HOSPITAL_COMMUNITY): Payer: Self-pay

## 2014-08-09 ENCOUNTER — Ambulatory Visit (HOSPITAL_COMMUNITY): Payer: Medicare Other

## 2014-08-09 DIAGNOSIS — M629 Disorder of muscle, unspecified: Secondary | ICD-10-CM

## 2014-08-09 DIAGNOSIS — M25611 Stiffness of right shoulder, not elsewhere classified: Secondary | ICD-10-CM

## 2014-08-09 DIAGNOSIS — M25612 Stiffness of left shoulder, not elsewhere classified: Secondary | ICD-10-CM

## 2014-08-09 DIAGNOSIS — Z9181 History of falling: Secondary | ICD-10-CM | POA: Diagnosis not present

## 2014-08-09 DIAGNOSIS — M6289 Other specified disorders of muscle: Secondary | ICD-10-CM

## 2014-08-09 DIAGNOSIS — R29898 Other symptoms and signs involving the musculoskeletal system: Secondary | ICD-10-CM

## 2014-08-09 NOTE — Therapy (Signed)
[facility] [address] [place], [place], [address] Phone: [phone]   Fax:  [phone]  Occupational Therapy Treatment  Patient Details  Name: James Park MRN: [id number] Date of Birth: Jul 02, 1929 Referring Provider:  Eartha Inch, MD  Encounter Date: 08/09/2014      OT End of Session - 08/09/14 1550    Visit Number 4   Number of Visits 8   Date for OT Re-Evaluation 09/23/14  mini reassessment: 08/22/14   Authorization Type BCBS Medicare   Authorization Time Period before 10th visit   Authorization - Visit Number 4   Authorization - Number of Visits 10   OT Start Time 1345   OT Stop Time 1430   OT Time Calculation (min) 45 min   Activity Tolerance Patient tolerated treatment well   Behavior During Therapy [facility] for tasks assessed/performed      Past Medical History  Diagnosis Date  . Atrial fibrillation 02/2011    Not anticoagulated  . Right bundle branch block   . Essential hypertension, benign   . Anxiety   . Depression   . Constipation   . GERD (gastroesophageal reflux disease)   . Arthritis   . Urinary retention     Self-catheterization when necessary  . Rotator cuff tear     Right - not repaired  . Stroke 05/2012    Gait disturbance only residual - hemorrhagic stroke  . CKD (chronic kidney disease) stage 5, GFR less than 15 ml/min     Previously on hemodialysis  . Pulmonary hypertension     Severe  . Chronotropic incompetence     Suspected based on GXT  . PUD (peptic ulcer disease)     In his late 52s    Past Surgical History  Procedure Laterality Date  . Knee surgery      Tendon repair  . Eye surgery Bilateral     Cataract  . Insertion of dialysis catheter Right 06/30/2012    Procedure: INSERTION OF DIALYSIS CATHETER right IJ;  Surgeon: Larina Earthly, MD;  Location: [facility] OR;  Service: Vascular;  Laterality: Right;  . Tonsillectomy    . Appendectomy      There were no vitals filed for this  visit.  Visit Diagnosis:  Shoulder weakness  Shoulder stiffness, left  Shoulder stiffness, right  Tight fascia      Subjective Assessment - 08/09/14 1548    Subjective  S: Besides the exercises you gave me, is there anything else I can do?   Currently in Pain? No/denies            [facility] OT Assessment - 08/09/14 1549    Assessment   Diagnosis bilateral shoulder pain and stiffness   Precautions   Precautions Fall                  OT Treatments/Exercises (OP) - 08/09/14 1549    Exercises   Exercises Shoulder   Shoulder Exercises: Supine   Protraction PROM;10 reps;AAROM;5 reps;Limitations   Protraction Limitations physical assistance from therapist with dowel   Horizontal ABduction PROM;10 reps;AAROM;5 reps;Limitations   Horizontal ABduction Limitations physical assistance from therapist with dowel   External Rotation PROM;10 reps   Internal Rotation PROM;10 reps   Flexion PROM;10 reps;AAROM;5 reps;Limitations   Flexion Limitations physical assistance from therapist with dowel   ABduction PROM;10 reps   Shoulder Exercises: Pulleys   Flexion 1 minute   ABduction 1 minute   Manual Therapy  Manual Therapy Myofascial release;Muscle Energy Technique   Myofascial Release Myofascial release to bilateral upper arms, trapezius, and scapularis region to decrease fascial restrictions and increase joint mobility in a pain free zone.    Muscle Energy Technique Muscle energy technique to right anterior deltoid to relax tone and muscle spasm and increase range of motion.                  OT Short Term Goals - 08/01/14 1504    OT SHORT TERM GOAL #1   Title Patient will be educated and independent with HEP.   Status On-going   OT SHORT TERM GOAL #2   Title Patient will return to highest level of independence with all basic daily and leisure tasks.   Status On-going   OT SHORT TERM GOAL #3   Title Patient will increase AROM to [facility] to increase ability to  reach into overhead cabinets with less difficulty.    Status On-going   OT SHORT TERM GOAL #4   Title Patient will decrease fascial restrictions to min amount to increase comfort level in BUE shoulder if driving.    Status On-going   OT SHORT TERM GOAL #5   Title Patient will increase BUE strength to 4-/5 to increase ability to complete daily tasks with less difficulty.    Status On-going                  Plan - 08/09/14 1550    Clinical Impression Statement A: Added pulleys this session. Unable to continue missed exercises or add thumb tacks due to time constraint. Pt continues to require min-mod assist to complete AAROM exercises supine. Pt did complete protraction supine with slightly less difficulty this session.   Plan P: Add thumb tacks and continue with missed exercises. Add shoulder flexion stretch/slide using wall to HEP.        Problem List Patient Active Problem List   Diagnosis Date Noted  . Melena 06/30/2014  . Anemia 06/30/2014  . Heme + stool 06/30/2014  . Constipation 11/30/2013  . Loss of weight 11/30/2013  . Abnormal CT scan, colon 11/30/2013  . Pulmonary hypertension 05/20/2013  . SSS (sick sinus syndrome) 05/06/2013  . CKD (chronic kidney disease) stage 5, GFR less than 15 ml/min 05/06/2013  . Essential hypertension, benign 06/11/2012  . Right bundle branch block   . Atrial fibrillation 02/14/2011    Limmie Patricia, OTR/L,CBIS  [phone]  08/09/2014, 3:52 PM  [facility] [address] [place], [place], [address] Phone: [phone]   Fax:  [phone]

## 2014-08-11 ENCOUNTER — Ambulatory Visit (HOSPITAL_COMMUNITY): Payer: Medicare Other | Admitting: Occupational Therapy

## 2014-08-11 ENCOUNTER — Encounter (HOSPITAL_COMMUNITY): Payer: Self-pay | Admitting: Occupational Therapy

## 2014-08-11 DIAGNOSIS — M25611 Stiffness of right shoulder, not elsewhere classified: Secondary | ICD-10-CM

## 2014-08-11 DIAGNOSIS — M25612 Stiffness of left shoulder, not elsewhere classified: Secondary | ICD-10-CM

## 2014-08-11 DIAGNOSIS — M629 Disorder of muscle, unspecified: Secondary | ICD-10-CM

## 2014-08-11 DIAGNOSIS — R29898 Other symptoms and signs involving the musculoskeletal system: Secondary | ICD-10-CM

## 2014-08-11 DIAGNOSIS — M6289 Other specified disorders of muscle: Secondary | ICD-10-CM

## 2014-08-11 DIAGNOSIS — Z9181 History of falling: Secondary | ICD-10-CM | POA: Diagnosis not present

## 2014-08-11 NOTE — Therapy (Signed)
[place] [facility] [address] [place], [place], [address] Phone: [phone]   Fax:  [phone]  Occupational Therapy Treatment  Patient Details  Name: James Park MRN: [id number] Date of Birth: 1929/06/29 Referring Provider:  Eartha Inch, MD  Encounter Date: 08/11/2014      OT End of Session - 08/11/14 1616    Visit Number 5   Number of Visits 8   Date for OT Re-Evaluation 09/23/14  mini reassessment: 08/22/14   Authorization Type BCBS Medicare   Authorization Time Period before 10th visit   Authorization - Visit Number 5   Authorization - Number of Visits 10   OT Start Time 1349   OT Stop Time 1433   OT Time Calculation (min) 44 min   Activity Tolerance Patient tolerated treatment well   Behavior During Therapy [facility] for tasks assessed/performed      Past Medical History  Diagnosis Date  . Atrial fibrillation 02/2011    Not anticoagulated  . Right bundle branch block   . Essential hypertension, benign   . Anxiety   . Depression   . Constipation   . GERD (gastroesophageal reflux disease)   . Arthritis   . Urinary retention     Self-catheterization when necessary  . Rotator cuff tear     Right - not repaired  . Stroke 05/2012    Gait disturbance only residual - hemorrhagic stroke  . CKD (chronic kidney disease) stage 5, GFR less than 15 ml/min     Previously on hemodialysis  . Pulmonary hypertension     Severe  . Chronotropic incompetence     Suspected based on GXT  . PUD (peptic ulcer disease)     In his late 52s    Past Surgical History  Procedure Laterality Date  . Knee surgery      Tendon repair  . Eye surgery Bilateral     Cataract  . Insertion of dialysis catheter Right 06/30/2012    Procedure: INSERTION OF DIALYSIS CATHETER right IJ;  Surgeon: Larina Earthly, MD;  Location: [facility] OR;  Service: Vascular;  Laterality: Right;  . Tonsillectomy    . Appendectomy      There were no vitals filed for this  visit.  Visit Diagnosis:  Shoulder weakness  Shoulder stiffness, left  Shoulder stiffness, right  Tight fascia      Subjective Assessment - 08/11/14 1614    Subjective  S: I'm doing ok today, I need my shoulders worked out.    Currently in Pain? No/denies            [facility] OT Assessment - 08/11/14 1613    Assessment   Diagnosis bilateral shoulder pain and stiffness   Precautions   Precautions Fall                  OT Treatments/Exercises (OP) - 08/11/14 1352    Exercises   Exercises Shoulder   Shoulder Exercises: Supine   Protraction PROM;10 reps;AAROM;5 reps;Limitations   Protraction Limitations physical assistance from therapist with dowel   Horizontal ABduction PROM;10 reps;AAROM;5 reps;Limitations   Horizontal ABduction Limitations physical assistance from therapist with dowel   External Rotation PROM;10 reps   Internal Rotation PROM;10 reps   Flexion PROM;10 reps;AAROM;5 reps;Limitations   Flexion Limitations physical assistance from therapist with dowel   ABduction PROM;10 reps;AAROM;5 reps   ABduction Limitations physical assist from therapist with dowel rod   Shoulder Exercises: ROM/Strengthening   Thumb Tacks 1'  Shoulder Exercises: Stretch   Wall Stretch - Flexion 3 reps;10 seconds   Manual Therapy   Manual Therapy Myofascial release;Muscle Energy Technique   Myofascial Release Myofascial release to bilateral upper arms, trapezius, and scapularis region to decrease fascial restrictions and increase joint mobility in a pain free zone.    Muscle Energy Technique Muscle energy technique to right anterior deltoid to relax tone and muscle spasm and increase range of motion.                  OT Short Term Goals - 08/01/14 1504    OT SHORT TERM GOAL #1   Title Patient will be educated and independent with HEP.   Status On-going   OT SHORT TERM GOAL #2   Title Patient will return to highest level of independence with all basic daily and  leisure tasks.   Status On-going   OT SHORT TERM GOAL #3   Title Patient will increase AROM to [facility] to increase ability to reach into overhead cabinets with less difficulty.    Status On-going   OT SHORT TERM GOAL #4   Title Patient will decrease fascial restrictions to min amount to increase comfort level in BUE shoulder if driving.    Status On-going   OT SHORT TERM GOAL #5   Title Patient will increase BUE strength to 4-/5 to increase ability to complete daily tasks with less difficulty.    Status On-going                  Plan - 08/11/14 1616    Clinical Impression Statement A: Added low thumb tacks, shoulder flexion wall stretch. Pt required mod assist to completed supine AAROM exercises. Pt refused to complete more than 5 repetitions of each exercise. Unable to continue missed exercises due to time constraints.    Plan P: Continue with missed exercises from previous sessions. Attempt to increase AAROM exercises to between 5-10        Problem List Patient Active Problem List   Diagnosis Date Noted  . Melena 06/30/2014  . Anemia 06/30/2014  . Heme + stool 06/30/2014  . Constipation 11/30/2013  . Loss of weight 11/30/2013  . Abnormal CT scan, colon 11/30/2013  . Pulmonary hypertension 05/20/2013  . SSS (sick sinus syndrome) 05/06/2013  . CKD (chronic kidney disease) stage 5, GFR less than 15 ml/min 05/06/2013  . Essential hypertension, benign 06/11/2012  . Right bundle branch block   . Atrial fibrillation 02/14/2011    Ezra Sites, OTR/L  [phone]  08/11/2014, 4:19 PM  [place] [facility] [address] [place], [place], [address] Phone: [phone]   Fax:  [phone]

## 2014-08-14 ENCOUNTER — Ambulatory Visit (HOSPITAL_COMMUNITY): Payer: Medicare Other | Attending: Family Medicine | Admitting: Specialist

## 2014-08-14 DIAGNOSIS — M6289 Other specified disorders of muscle: Secondary | ICD-10-CM | POA: Diagnosis present

## 2014-08-14 DIAGNOSIS — M25612 Stiffness of left shoulder, not elsewhere classified: Secondary | ICD-10-CM | POA: Diagnosis present

## 2014-08-14 DIAGNOSIS — M629 Disorder of muscle, unspecified: Secondary | ICD-10-CM | POA: Insufficient documentation

## 2014-08-14 DIAGNOSIS — Z7409 Other reduced mobility: Secondary | ICD-10-CM | POA: Insufficient documentation

## 2014-08-14 DIAGNOSIS — R2689 Other abnormalities of gait and mobility: Secondary | ICD-10-CM | POA: Diagnosis present

## 2014-08-14 DIAGNOSIS — R2681 Unsteadiness on feet: Secondary | ICD-10-CM | POA: Insufficient documentation

## 2014-08-14 DIAGNOSIS — R269 Unspecified abnormalities of gait and mobility: Secondary | ICD-10-CM | POA: Insufficient documentation

## 2014-08-14 DIAGNOSIS — R29898 Other symptoms and signs involving the musculoskeletal system: Secondary | ICD-10-CM | POA: Diagnosis present

## 2014-08-14 DIAGNOSIS — R293 Abnormal posture: Secondary | ICD-10-CM | POA: Insufficient documentation

## 2014-08-14 DIAGNOSIS — M25611 Stiffness of right shoulder, not elsewhere classified: Secondary | ICD-10-CM | POA: Insufficient documentation

## 2014-08-14 DIAGNOSIS — Z9181 History of falling: Secondary | ICD-10-CM | POA: Insufficient documentation

## 2014-08-14 NOTE — Therapy (Signed)
[place] [facility] [address] [place], [place], [address] Phone: [phone]   Fax:  [phone]  Occupational Therapy Treatment  Patient Details  Name: James Park MRN: [id number] Date of Birth: Jul 19, 1929 Referring Provider:  Eartha Inch, MD  Encounter Date: 08/14/2014      OT End of Session - 08/14/14 1458    Visit Number 6   Number of Visits 8   Date for OT Re-Evaluation 09/23/14  mini reassess on 8/9   Authorization Type BCBS Medicare   Authorization Time Period before 10th visit   Authorization - Visit Number 6   Authorization - Number of Visits 10   OT Start Time 1350   OT Stop Time 1435   OT Time Calculation (min) 45 min   Activity Tolerance Patient tolerated treatment well   Behavior During Therapy [facility] for tasks assessed/performed      Past Medical History  Diagnosis Date  . Atrial fibrillation 02/2011    Not anticoagulated  . Right bundle branch block   . Essential hypertension, benign   . Anxiety   . Depression   . Constipation   . GERD (gastroesophageal reflux disease)   . Arthritis   . Urinary retention     Self-catheterization when necessary  . Rotator cuff tear     Right - not repaired  . Stroke 05/2012    Gait disturbance only residual - hemorrhagic stroke  . CKD (chronic kidney disease) stage 5, GFR less than 15 ml/min     Previously on hemodialysis  . Pulmonary hypertension     Severe  . Chronotropic incompetence     Suspected based on GXT  . PUD (peptic ulcer disease)     In his late 71s    Past Surgical History  Procedure Laterality Date  . Knee surgery      Tendon repair  . Eye surgery Bilateral     Cataract  . Insertion of dialysis catheter Right 06/30/2012    Procedure: INSERTION OF DIALYSIS CATHETER right IJ;  Surgeon: Larina Earthly, MD;  Location: [facility] OR;  Service: Vascular;  Laterality: Right;  . Tonsillectomy    . Appendectomy      There were no vitals filed for this  visit.  Visit Diagnosis:  Shoulder weakness  Shoulder stiffness, left      Subjective Assessment - 08/14/14 1453    Subjective  S:  I really think my shoulders are what needs the most work.  They just wont work and I dont know why.            [facility] OT Assessment - 08/14/14 0001    Assessment   Diagnosis bilateral shoulder pain and stiffness   Precautions   Precautions Fall                  OT Treatments/Exercises (OP) - 08/14/14 0001    Exercises   Exercises Shoulder   Shoulder Exercises: Supine   Protraction PROM;10 reps;AAROM;5 reps;Limitations   Protraction Limitations physical assistance from therapist with dowel   Horizontal ABduction PROM;10 reps;AAROM;5 reps;Limitations   Horizontal ABduction Limitations physical assistance from therapist with dowel   External Rotation PROM;10 reps   Internal Rotation PROM;10 reps   Flexion PROM;10 reps;AAROM;5 reps;Limitations   Flexion Limitations physical assistance from therapist with dowel   ABduction PROM;10 reps;AAROM;5 reps   ABduction Limitations physical assist from therapist with dowel rod   Shoulder Exercises: Therapy Ball   Flexion 20 reps  ABduction 20 reps   Shoulder Exercises: ROM/Strengthening   Thumb Tacks 1'   Other ROM/Strengthening Exercises pinch tree placed 10 clothespins from bucket to horizontal to vertical with mod difficulty with right arm.  attempted with left arm and could only complete bucket to horizontal    Shoulder Exercises: Stretch   Wall Stretch - Flexion 3 reps;10 seconds   Manual Therapy   Manual Therapy Myofascial release;Muscle Energy Technique   Myofascial Release Myofascial release to bilateral upper arms, trapezius, and scapularis region to decrease fascial restrictions and increase joint mobility in a pain free zone.    Muscle Energy Technique Muscle energy technique to right anterior deltoid to relax tone and muscle spasm and increase range of motion.                   OT Short Term Goals - 08/01/14 1504    OT SHORT TERM GOAL #1   Title Patient will be educated and independent with HEP.   Status On-going   OT SHORT TERM GOAL #2   Title Patient will return to highest level of independence with all basic daily and leisure tasks.   Status On-going   OT SHORT TERM GOAL #3   Title Patient will increase AROM to [facility] to increase ability to reach into overhead cabinets with less difficulty.    Status On-going   OT SHORT TERM GOAL #4   Title Patient will decrease fascial restrictions to min amount to increase comfort level in BUE shoulder if driving.    Status On-going   OT SHORT TERM GOAL #5   Title Patient will increase BUE strength to 4-/5 to increase ability to complete daily tasks with less difficulty.    Status On-going                  Plan - 08/14/14 1458    Clinical Impression Statement A:  patient continues to have difficulty completing dowel rod exercises in supine, requiriing physical assistance from OT.  Increased repetitions with ball stretches this date and added functional reaching activity.   Plan P:  Increase AAROM repetitions to 10 with less facilitation from OT.          Problem List Patient Active Problem List   Diagnosis Date Noted  . Melena 06/30/2014  . Anemia 06/30/2014  . Heme + stool 06/30/2014  . Constipation 11/30/2013  . Loss of weight 11/30/2013  . Abnormal CT scan, colon 11/30/2013  . Pulmonary hypertension 05/20/2013  . SSS (sick sinus syndrome) 05/06/2013  . CKD (chronic kidney disease) stage 5, GFR less than 15 ml/min 05/06/2013  . Essential hypertension, benign 06/11/2012  . Right bundle branch block   . Atrial fibrillation 02/14/2011    Shirlean Mylar, OTR/L [phone]  08/14/2014, 3:02 PM  [place] [facility] [address] [place], [place], [address] Phone: [phone]   Fax:  [phone]

## 2014-08-15 ENCOUNTER — Ambulatory Visit (HOSPITAL_COMMUNITY): Payer: Medicare Other | Admitting: Physical Therapy

## 2014-08-15 ENCOUNTER — Encounter (HOSPITAL_COMMUNITY): Payer: Self-pay | Admitting: Physical Therapy

## 2014-08-15 DIAGNOSIS — R29898 Other symptoms and signs involving the musculoskeletal system: Secondary | ICD-10-CM | POA: Diagnosis not present

## 2014-08-15 DIAGNOSIS — R269 Unspecified abnormalities of gait and mobility: Secondary | ICD-10-CM

## 2014-08-15 DIAGNOSIS — Z7409 Other reduced mobility: Secondary | ICD-10-CM

## 2014-08-15 DIAGNOSIS — R293 Abnormal posture: Secondary | ICD-10-CM

## 2014-08-15 DIAGNOSIS — Z9181 History of falling: Secondary | ICD-10-CM

## 2014-08-15 DIAGNOSIS — R2681 Unsteadiness on feet: Secondary | ICD-10-CM

## 2014-08-15 DIAGNOSIS — M6281 Muscle weakness (generalized): Secondary | ICD-10-CM

## 2014-08-15 DIAGNOSIS — R2689 Other abnormalities of gait and mobility: Secondary | ICD-10-CM

## 2014-08-15 NOTE — Patient Instructions (Signed)
Bridging   Slowly raise buttocks from floor, keeping stomach tight. Repeat _10___ times per set. Do __1__ sets per session. Do ___2_ sessions per day.  http://orth.exer.us/1096   Copyright  VHI. All rights reserved.  Functional Quadriceps: Sit to Stand   Sit on edge of chair, feet flat on floor. Stand upright, extending knees fully. Repeat 10____ times per set. Do ___1_ sets per session. Do 2____ sessions per day.  http://orth.exer.us/734   Copyright  VHI. All rights reserved.  Functional Quadriceps: Chair Squat   Keeping feet flat on floor, shoulder width apart, squat as low as is comfortable. Use support as necessary. Repeat ___10_ times per set. Do ___1_ sets per session. Do ___2_ sessions per day.  http://orth.exer.us/736   Copyright  VHI. All rights reserved.  Feet Heel-Toe "Tandem", Varied Arm Positions - Eyes Open  At a kitchen counter With eyes open, right foot slightly  in front of the other, arms at counter  look straight ahead at a stationary object. Hold __20-30__ seconds. Repeat __2__ times per session. Do __1__ sessions per day.  Copyright  VHI. All rights reserved.  Single Leg - Eyes Open   Holding support, lift right leg while maintaining balance over other leg. Progress to removing hands from support surface for longer periods of time. Hold__5__ seconds. Repeat __3__ times per session. Do _2___ sessions per day.  Copyright  VHI. All rights reserved.

## 2014-08-15 NOTE — Therapy (Signed)
[place] [facility] [address] [place], [place], [address] Phone: [phone]   Fax:  [phone]  Physical Therapy Treatment  Patient Details  Name: James Park MRN: [id number] Date of Birth: 1929-02-15 Referring Provider:  Eartha Inch, MD  Encounter Date: 08/15/2014      PT End of Session - 08/15/14 1208    Visit Number 12   Number of Visits 15   Authorization Type BCBS Medicare Advantage    Authorization - Visit Number 12   Authorization - Number of Visits 15   PT Start Time 1100   PT Stop Time 1200   PT Time Calculation (min) 60 min   Equipment Utilized During Treatment Gait belt   Activity Tolerance Patient tolerated treatment well      Past Medical History  Diagnosis Date  . Atrial fibrillation 02/2011    Not anticoagulated  . Right bundle branch block   . Essential hypertension, benign   . Anxiety   . Depression   . Constipation   . GERD (gastroesophageal reflux disease)   . Arthritis   . Urinary retention     Self-catheterization when necessary  . Rotator cuff tear     Right - not repaired  . Stroke 05/2012    Gait disturbance only residual - hemorrhagic stroke  . CKD (chronic kidney disease) stage 5, GFR less than 15 ml/min     Previously on hemodialysis  . Pulmonary hypertension     Severe  . Chronotropic incompetence     Suspected based on GXT  . PUD (peptic ulcer disease)     In his late 32s    Past Surgical History  Procedure Laterality Date  . Knee surgery      Tendon repair  . Eye surgery Bilateral     Cataract  . Insertion of dialysis catheter Right 06/30/2012    Procedure: INSERTION OF DIALYSIS CATHETER right IJ;  Surgeon: Larina Earthly, MD;  Location: [facility] OR;  Service: Vascular;  Laterality: Right;  . Tonsillectomy    . Appendectomy      There were no vitals filed for this visit.  Visit Diagnosis:  History of fall  Poor balance  Poor posture  Proximal muscle weakness  Abnormality  of gait  Impaired functional mobility and activity tolerance  Unsteadiness      Subjective Assessment - 08/15/14 1103    Subjective Mr. Villena states that he is not doing his exercises at home.   Pt has not fallen since he started therapy.    Pertinent History Patient had a fall recently, about 5 weeks ago; he was trying to get something out of his barn and got his feet tangled up in an electrical cord. No fractures. this was the first big fall he has had recently.    How long can you walk comfortably? Pt is able to walk for 5-10 minutes before he wants to sit down secondary to being tired.    Patient Stated Goals better balance    Currently in Pain? No/denies            [facility] PT Assessment - 08/15/14 0001    Assessment   Medical Diagnosis recent fall    Onset Date/Surgical Date --  about 5 weeks ago    Precautions   Precautions None;Fall   Restrictions   Weight Bearing Restrictions No   Balance Screen   Has the patient fallen in the past 6 months Yes   How many  times? 1   Has the patient had a decrease in activity level because of a fear of falling?  Yes   Is the patient reluctant to leave their home because of a fear of falling?  Yes   Prior Function   Level of Independence Independent with basic ADLs;Independent with gait;Independent with transfers   Vocation Retired   Leisure no hobbies    Observation/Other Assessments   Observations 6 minute no rest x 806 ft.  806 was 420   Focus on Therapeutic Outcomes (FOTO)  40% limited    Posture/Postural Control   Posture Comments forward head, flat spinal curves, rounded shoulders   AROM   Right Hip External Rotation  40   Right Hip Internal Rotation  21   Left Hip External Rotation  40   Left Hip Internal Rotation  22   Right Ankle Dorsiflexion 18   Left Ankle Dorsiflexion 16   Strength   Right Hip Flexion 5/5  was 3/5   Right Hip ABduction 5/5  was 3-/5   Left Hip Flexion 5/5  was 3-/5   Left Hip Extension 3-/5    Left Hip ABduction 5/5  was 2/5    Right Knee Flexion 5/5  was 4-5    Right Knee Extension 5/5  was a 4/5   Left Knee Flexion 5/5  was 3/5   Left Knee Extension 5/5  initally 3+   Right Ankle Dorsiflexion 5/5   Left Ankle Dorsiflexion 5/5   Ambulation/Gait   Gait Comments very rigid trunk and hips with minimal arm swing, reduced step lengths/stance time, knee flexion in  stance, reduced ankle DF, possible slight varus knees    Berg Balance Test   Sit to Stand Able to stand without using hands and stabilize independently   Standing Unsupported Able to stand safely 2 minutes   Sitting with Back Unsupported but Feet Supported on Floor or Stool Able to sit safely and securely 2 minutes   Stand to Sit Controls descent by using hands   Transfers Able to transfer safely, minor use of hands   Standing Unsupported with Eyes Closed Able to stand 10 seconds with supervision   Standing Ubsupported with Feet Together Able to place feet together independently and stand for 1 minute with supervision   From Standing, Reach Forward with Outstretched Arm Can reach forward >5 cm safely (2")   From Standing Position, Pick up Object from Floor Able to pick up shoe, needs supervision   From Standing Position, Turn to Look Behind Over each Shoulder Looks behind one side only/other side shows less weight shift   Turn 360 Degrees Able to turn 360 degrees safely but slowly   Standing Unsupported, Alternately Place Feet on Step/Stool Able to stand independently and complete 8 steps >20 seconds   Standing Unsupported, One Foot in Front Able to plae foot ahead of the other independently and hold 30 seconds   Standing on One Leg Tries to lift leg/unable to hold 3 seconds but remains standing independently   Total Score 42 was 34       completed 15 reps bridging.               PT Education - 08/15/14 1208    Education provided Yes   Education Details new HEP to include balance    Person(s) Educated  Patient   Methods Explanation;Demonstration   Comprehension Verbalized understanding;Returned demonstration          PT Short Term Goals -  08/15/14 1133    PT SHORT TERM GOAL #1   Title Patient will demonstrate a score of at least 42 on Berg balance test in order to reduce overall fall risk    Time 4   Period Weeks   Status Achieved   PT SHORT TERM GOAL #2   Title Patient will demonstrate at least 4-/5 proximal muscle strength and 5/5 strength in bilateral lower extremities    Baseline all except hip extension which is at 3-/5    Time 4   Status Achieved   PT SHORT TERM GOAL #3   Title Patient will demonstrate at least 30 degrees bilateral hip IR and 45 degrees bilateral hip ER    Time 4   Status Achieved   PT SHORT TERM GOAL #4   Title Patient will demonstrate improved functional activity tolerance as evidenced by an ability to ambulate at least 616ft during 6 minute walk test with no assistive device    Baseline 600 ft on 08/15/2014   Time 4   Period Weeks   Status On-going           PT Long Term Goals - 08/15/14 1145    PT LONG TERM GOAL #1   Title Patient will be independent in correctly and consistently performing appropriate HEP    Time 8   Period Weeks   Status On-going   PT LONG TERM GOAL #2   Title Patient will demonstrate reduced fall risk as evidenced by an increase on Berg balance scale to at least 48 points    Baseline Berg 42 on 08/15/2014   Time 8   Period Weeks   Status On-going   PT LONG TERM GOAL #3   Title Patient will demonstrate improved gait mechanics, including improved pelvic and trunk rotation, symmetrical bilateral arm swing, improved step lengths with improved TKE, improved foot clearance during gait, and improved gait speed    Time 8   Period Weeks   Status On-going   PT LONG TERM GOAL #4   Title Patient will demonstrate improved functional activity tolerance as evidenced by an ability to ambulate at least 830ft during 6 minute walk test  with no fatigue and no assistive device    Time 8   Period Weeks   Status On-going   PT LONG TERM GOAL #5   Title Patient will demonstrate improved balance reaction skills and safety awareness as evidenced by successful navigation of obstacle courses on even and uneven surfaces and only supervision from therapist    Time 8   Status On-going   PT LONG TERM GOAL #6   Title Patient to report that he has experienced no falls in or outside of his home within the past 8 weeks    Baseline Pt fell 4 weeks ago.but was sitting down and missed his chair.    Time 8   Status On-going               Plan - 08/15/14 1210    Clinical Impression Statement Mr. Tiberio was reassessed today with noted improvement in all aspects.  Pt has three more sesions left and will benefit with focus on hip extension strength, and balance.  Pt will be ready for discharge in three sessions    Pt will benefit from skilled therapeutic intervention in order to improve on the following deficits Abnormal gait;Decreased coordination;Decreased range of motion;Difficulty walking;Decreased safety awareness;Decreased activity tolerance;Decreased balance;Hypomobility;Impaired flexibility;Improper body mechanics;Decreased mobility;Decreased strength;Postural dysfunction   PT Frequency 2x /  week   PT Duration 3 weeks   PT Treatment/Interventions ADLs/Self Care Home Management;Moist Heat;Gait training;Stair training;Functional mobility training;Therapeutic activities;Therapeutic exercise;Balance training;Neuromuscular re-education;Patient/family education;Manual techniques;Energy conservation   PT Next Visit Plan slow sit to stand, SLS, tandem stance , narrow base of support, standing hip extension B. bridging and stair climbing.    PT Home Exercise Plan update given           G-Codes - 24-Aug-2014 05-31-14    Functional Assessment Tool Used FOTO    Functional Limitation Mobility: Walking and moving around   Mobility: Walking and  Moving Around Current Status [phone]) At least 40 percent but less than 60 percent impaired, limited or restricted   Mobility: Walking and Moving Around Goal Status [phone]) At least 40 percent but less than 60 percent impaired, limited or restricted      Problem List Patient Active Problem List   Diagnosis Date Noted  . Melena 06/30/2014  . Anemia 06/30/2014  . Heme + stool 06/30/2014  . Constipation 11/30/2013  . Loss of weight 11/30/2013  . Abnormal CT scan, colon 11/30/2013  . Pulmonary hypertension 05/20/2013  . SSS (sick sinus syndrome) 05/06/2013  . CKD (chronic kidney disease) stage 5, GFR less than 15 ml/min 05/06/2013  . Essential hypertension, benign 06/11/2012  . Right bundle branch block   . Atrial fibrillation 02/14/2011    Virgina Organ, PT CLT [phone] Aug 24, 2014, 12:23 PM   [facility] [address] [place], [place], [address] Phone: [phone]   Fax:  [phone]

## 2014-08-16 ENCOUNTER — Ambulatory Visit (HOSPITAL_COMMUNITY): Payer: Medicare Other

## 2014-08-16 DIAGNOSIS — R2689 Other abnormalities of gait and mobility: Secondary | ICD-10-CM

## 2014-08-16 DIAGNOSIS — R29898 Other symptoms and signs involving the musculoskeletal system: Secondary | ICD-10-CM

## 2014-08-16 DIAGNOSIS — M6281 Muscle weakness (generalized): Secondary | ICD-10-CM

## 2014-08-16 DIAGNOSIS — R2681 Unsteadiness on feet: Secondary | ICD-10-CM

## 2014-08-16 DIAGNOSIS — M25612 Stiffness of left shoulder, not elsewhere classified: Secondary | ICD-10-CM

## 2014-08-16 DIAGNOSIS — Z7409 Other reduced mobility: Secondary | ICD-10-CM

## 2014-08-16 DIAGNOSIS — M629 Disorder of muscle, unspecified: Secondary | ICD-10-CM

## 2014-08-16 DIAGNOSIS — Z9181 History of falling: Secondary | ICD-10-CM

## 2014-08-16 DIAGNOSIS — M25611 Stiffness of right shoulder, not elsewhere classified: Secondary | ICD-10-CM

## 2014-08-16 DIAGNOSIS — R293 Abnormal posture: Secondary | ICD-10-CM

## 2014-08-16 DIAGNOSIS — R269 Unspecified abnormalities of gait and mobility: Secondary | ICD-10-CM

## 2014-08-16 DIAGNOSIS — M6289 Other specified disorders of muscle: Secondary | ICD-10-CM

## 2014-08-16 NOTE — Therapy (Signed)
[facility] [address] [place], [place], [address] Phone: [phone]   Fax:  [phone]  Physical Therapy Treatment  Patient Details  Name: James Park MRN: [id number] Date of Birth: 1929/10/31 Referring Provider:  Eartha Inch, MD  Encounter Date: 08/16/2014      PT End of Session - 08/16/14 1523    Visit Number 13   Number of Visits 15   PT Start Time 1438   PT Stop Time 1523   PT Time Calculation (min) 45 min   Equipment Utilized During Treatment Gait belt   Activity Tolerance Patient limited by fatigue;Patient tolerated treatment well   Behavior During Therapy [facility] for tasks assessed/performed      Past Medical History  Diagnosis Date  . Atrial fibrillation 02/2011    Not anticoagulated  . Right bundle branch block   . Essential hypertension, benign   . Anxiety   . Depression   . Constipation   . GERD (gastroesophageal reflux disease)   . Arthritis   . Urinary retention     Self-catheterization when necessary  . Rotator cuff tear     Right - not repaired  . Stroke 05/2012    Gait disturbance only residual - hemorrhagic stroke  . CKD (chronic kidney disease) stage 5, GFR less than 15 ml/min     Previously on hemodialysis  . Pulmonary hypertension     Severe  . Chronotropic incompetence     Suspected based on GXT  . PUD (peptic ulcer disease)     In his late 36s    Past Surgical History  Procedure Laterality Date  . Knee surgery      Tendon repair  . Eye surgery Bilateral     Cataract  . Insertion of dialysis catheter Right 06/30/2012    Procedure: INSERTION OF DIALYSIS CATHETER right IJ;  Surgeon: Larina Earthly, MD;  Location: [facility] OR;  Service: Vascular;  Laterality: Right;  . Tonsillectomy    . Appendectomy      There were no vitals filed for this visit.  Visit Diagnosis:  History of fall  Poor balance  Poor posture  Proximal muscle weakness  Abnormality of gait  Impaired functional  mobility and activity tolerance  Unsteadiness      Subjective Assessment - 08/16/14 1437    Subjective Pt stated at entrance that he is tired today, main problem is fatigue   Currently in Pain? No/denies          [facility] Adult PT Treatment/Exercise - 08/16/14 1508    Knee/Hip Exercises: Aerobic   [place] level 3 x 10 min   Knee/Hip Exercises: Standing   Hip Extension Both;10 reps;Knee straight   Shoulder Exercises: Supine    Supine Bridges 15x         Balance Exercises - 08/16/14 1502    Balance Exercises: Standing   Standing Eyes Opened Narrow base of support (BOS);2 reps;Time  2x 60" min guard no HHA   Tandem Stance Eyes open;3 reps;30 secs  no HHA   SLS Eyes open;Solid surface;3 reps;30 secs  1 finger assistnace BLE   Sit to Stand Time 5 STS each LE behind           PT Education - 08/15/14 1208    Education provided Yes   Education Details new HEP to include balance    Person(s) Educated Patient   Methods Explanation;Demonstration   Comprehension Verbalized understanding;Returned demonstration  PT Short Term Goals - 09-02-14 1133    PT SHORT TERM GOAL #1   Title Patient will demonstrate a score of at least 42 on Berg balance test in order to reduce overall fall risk    Time 4   Period Weeks   Status Achieved   PT SHORT TERM GOAL #2   Title Patient will demonstrate at least 4-/5 proximal muscle strength and 5/5 strength in bilateral lower extremities    Baseline all except hip extension which is at 3-/5    Time 4   Status Achieved   PT SHORT TERM GOAL #3   Title Patient will demonstrate at least 30 degrees bilateral hip IR and 45 degrees bilateral hip ER    Time 4   Status Achieved   PT SHORT TERM GOAL #4   Title Patient will demonstrate improved functional activity tolerance as evidenced by an ability to ambulate at least 654ft during 6 minute walk test with no assistive device    Baseline 600 ft on September 02, 2014   Time 4   Period Weeks    Status On-going           PT Long Term Goals - 08/16/14 1837    PT LONG TERM GOAL #1   Title Patient will be independent in correctly and consistently performing appropriate HEP    Status On-going   PT LONG TERM GOAL #2   Title Patient will demonstrate reduced fall risk as evidenced by an increase on Berg balance scale to at least 48 points    Status On-going   PT LONG TERM GOAL #3   Title Patient will demonstrate improved gait mechanics, including improved pelvic and trunk rotation, symmetrical bilateral arm swing, improved step lengths with improved TKE, improved foot clearance during gait, and improved gait speed    Status On-going   PT LONG TERM GOAL #4   Title Patient will demonstrate improved functional activity tolerance as evidenced by an ability to ambulate at least 87ft during 6 minute walk test with no fatigue and no assistive device    Status On-going   PT LONG TERM GOAL #5   Title Patient will demonstrate improved balance reaction skills and safety awareness as evidenced by successful navigation of obstacle courses on even and uneven surfaces and only supervision from therapist    Status On-going   PT LONG TERM GOAL #6   Title Patient to report that he has experienced no falls in or outside of his home within the past 8 weeks    Status On-going               Plan - 08/16/14 1549    Clinical Impression Statement Session focus on improving balance, gluteal strengthenig and acitivity tolerance.  Pt continues to exhibit weak gluteal musculature with increased difficutly descending to sit slowly and cueing to improve hip extension with gait mechanics.  Min assistnace required through session with balance activities to reduce risk of falls.  No reports of pain through session.   PT Next Visit Plan Continue with slow sit to stand, SLS, tandem stance , narrow base of support, standing hip extension B. bridging.  Begin stair climbing. next session.             G-Codes - September 02, 2014 1216    Functional Assessment Tool Used FOTO    Functional Limitation Mobility: Walking and moving around   Mobility: Walking and Moving Around Current Status [phone]) At least 40 percent but less than 60 percent  impaired, limited or restricted   Mobility: Walking and Moving Around Goal Status [phone]) At least 40 percent but less than 60 percent impaired, limited or restricted      Problem List Patient Active Problem List   Diagnosis Date Noted  . Melena 06/30/2014  . Anemia 06/30/2014  . Heme + stool 06/30/2014  . Constipation 11/30/2013  . Loss of weight 11/30/2013  . Abnormal CT scan, colon 11/30/2013  . Pulmonary hypertension 05/20/2013  . SSS (sick sinus syndrome) 05/06/2013  . CKD (chronic kidney disease) stage 5, GFR less than 15 ml/min 05/06/2013  . Essential hypertension, benign 06/11/2012  . Right bundle branch block   . Atrial fibrillation 02/14/2011   Becky Sax, LPTA; CBIS [phone]   James Park 08/16/2014, 6:39 PM  [place] [facility] [address] [place], [place], [address] Phone: [phone]   Fax:  [phone]

## 2014-08-16 NOTE — Therapy (Signed)
[facility] [address] [place], [place], [address] Phone: [phone]   Fax:  [phone]  Occupational Therapy Treatment  Patient Details  Name: James Park MRN: [id number] Date of Birth: 1929/04/11 Referring Provider:  Eartha Inch, MD  Encounter Date: 08/16/2014      OT End of Session - 08/16/14 1442    Visit Number 7   Number of Visits 8   Date for OT Re-Evaluation 09/23/14  mini reassess on 8/9   Authorization Type BCBS Medicare   Authorization Time Period before 10th visit   Authorization - Visit Number 7   Authorization - Number of Visits 10   OT Start Time 1351   OT Stop Time 1430   OT Time Calculation (min) 39 min   Activity Tolerance Patient tolerated treatment well   Behavior During Therapy [facility] for tasks assessed/performed      Past Medical History  Diagnosis Date  . Atrial fibrillation 02/2011    Not anticoagulated  . Right bundle branch block   . Essential hypertension, benign   . Anxiety   . Depression   . Constipation   . GERD (gastroesophageal reflux disease)   . Arthritis   . Urinary retention     Self-catheterization when necessary  . Rotator cuff tear     Right - not repaired  . Stroke 05/2012    Gait disturbance only residual - hemorrhagic stroke  . CKD (chronic kidney disease) stage 5, GFR less than 15 ml/min     Previously on hemodialysis  . Pulmonary hypertension     Severe  . Chronotropic incompetence     Suspected based on GXT  . PUD (peptic ulcer disease)     In his late 45s    Past Surgical History  Procedure Laterality Date  . Knee surgery      Tendon repair  . Eye surgery Bilateral     Cataract  . Insertion of dialysis catheter Right 06/30/2012    Procedure: INSERTION OF DIALYSIS CATHETER right IJ;  Surgeon: Larina Earthly, MD;  Location: [facility] OR;  Service: Vascular;  Laterality: Right;  . Tonsillectomy    . Appendectomy      There were no vitals filed for this  visit.  Visit Diagnosis:  Shoulder weakness  Shoulder stiffness, left  Shoulder stiffness, right  Tight fascia      Subjective Assessment - 08/16/14 1437    Subjective  S: My shoulder are sore today. Less than the middle.    Currently in Pain? Yes   Pain Score 5    Pain Location Shoulder   Pain Orientation Right;Left   Pain Descriptors / Indicators Sore   Pain Type Chronic pain                      OT Treatments/Exercises (OP) - 08/16/14 1421    Exercises   Exercises Shoulder   Shoulder Exercises: Supine   Protraction PROM;AAROM;10 reps;Limitations   Protraction Limitations physical assistance from therapist with dowel   Horizontal ABduction PROM;10 reps   External Rotation PROM;10 reps   Internal Rotation PROM;10 reps   Flexion PROM;AAROM;10 reps;Limitations   Flexion Limitations physical assistance from therapist with dowel   ABduction PROM;10 reps   Shoulder Exercises: Pulleys   Flexion 1 minute   ABduction 1 minute   Manual Therapy   Manual Therapy Myofascial release;Muscle Energy Technique   Myofascial Release Myofascial release to bilateral upper arms, trapezius, and scapularis  region to decrease fascial restrictions and increase joint mobility in a pain free zone.    Muscle Energy Technique Muscle energy technique to right anterior deltoid to relax tone and muscle spasm and increase range of motion.                  OT Short Term Goals - 08/01/14 1504    OT SHORT TERM GOAL #1   Title Patient will be educated and independent with HEP.   Status On-going   OT SHORT TERM GOAL #2   Title Patient will return to highest level of independence with all basic daily and leisure tasks.   Status On-going   OT SHORT TERM GOAL #3   Title Patient will increase AROM to [facility] to increase ability to reach into overhead cabinets with less difficulty.    Status On-going   OT SHORT TERM GOAL #4   Title Patient will decrease fascial restrictions to min  amount to increase comfort level in BUE shoulder if driving.    Status On-going   OT SHORT TERM GOAL #5   Title Patient will increase BUE strength to 4-/5 to increase ability to complete daily tasks with less difficulty.    Status On-going                  Plan - 08/16/14 1443    Clinical Impression Statement A: Increased repetitions to 10 for flexion and protraction during AAROM. OT did provide facilitation although it was slightly less.    Plan P: Cont to work on increasing AAROM repetitions to 10 with less faciliation.        Problem List Patient Active Problem List   Diagnosis Date Noted  . Melena 06/30/2014  . Anemia 06/30/2014  . Heme + stool 06/30/2014  . Constipation 11/30/2013  . Loss of weight 11/30/2013  . Abnormal CT scan, colon 11/30/2013  . Pulmonary hypertension 05/20/2013  . SSS (sick sinus syndrome) 05/06/2013  . CKD (chronic kidney disease) stage 5, GFR less than 15 ml/min 05/06/2013  . Essential hypertension, benign 06/11/2012  . Right bundle branch block   . Atrial fibrillation 02/14/2011    Limmie Patricia, OTR/L,CBIS  [phone]  08/16/2014, 2:54 PM  [facility] [address] [place], [place], [address] Phone: [phone]   Fax:  [phone]

## 2014-08-21 ENCOUNTER — Ambulatory Visit (HOSPITAL_COMMUNITY): Payer: Medicare Other | Admitting: Occupational Therapy

## 2014-08-21 ENCOUNTER — Ambulatory Visit (HOSPITAL_COMMUNITY): Payer: Medicare Other | Admitting: Physical Therapy

## 2014-08-21 ENCOUNTER — Encounter (HOSPITAL_COMMUNITY): Payer: Self-pay | Admitting: Occupational Therapy

## 2014-08-21 DIAGNOSIS — R269 Unspecified abnormalities of gait and mobility: Secondary | ICD-10-CM

## 2014-08-21 DIAGNOSIS — Z7409 Other reduced mobility: Secondary | ICD-10-CM

## 2014-08-21 DIAGNOSIS — R293 Abnormal posture: Secondary | ICD-10-CM

## 2014-08-21 DIAGNOSIS — R29898 Other symptoms and signs involving the musculoskeletal system: Secondary | ICD-10-CM

## 2014-08-21 DIAGNOSIS — M25611 Stiffness of right shoulder, not elsewhere classified: Secondary | ICD-10-CM

## 2014-08-21 DIAGNOSIS — M25612 Stiffness of left shoulder, not elsewhere classified: Secondary | ICD-10-CM

## 2014-08-21 DIAGNOSIS — M6289 Other specified disorders of muscle: Secondary | ICD-10-CM

## 2014-08-21 DIAGNOSIS — M629 Disorder of muscle, unspecified: Secondary | ICD-10-CM

## 2014-08-21 DIAGNOSIS — Z9181 History of falling: Secondary | ICD-10-CM

## 2014-08-21 DIAGNOSIS — M6281 Muscle weakness (generalized): Secondary | ICD-10-CM

## 2014-08-21 DIAGNOSIS — R2681 Unsteadiness on feet: Secondary | ICD-10-CM

## 2014-08-21 NOTE — Therapy (Signed)
[place], [place], [address] Phone: [phone]   Fax:  [phone]  Physical Therapy Treatment (Discharge)  Patient Details  Name: James Park MRN: [id number] Date of Birth: Nov 04, 1929 Referring Provider:  Chesley Noon, MD  Encounter Date: 08/21/2014      PT End of Session - 08/21/14 1506    Visit Number 14   Number of Visits 15   Authorization Type BCBS Medicare Advantage    Authorization Time Period 06/06/14 to 08/06/14; G-code done 7th visit    Authorization - Visit Number 14   PT Start Time 1245   PT Stop Time 1516   PT Time Calculation (min) 45 min   Activity Tolerance Patient limited by fatigue;Patient tolerated treatment well   Behavior During Therapy [facility] for tasks assessed/performed      Past Medical History  Diagnosis Date  . Atrial fibrillation 02/2011    Not anticoagulated  . Right bundle branch block   . Essential hypertension, benign   . Anxiety   . Depression   . Constipation   . GERD (gastroesophageal reflux disease)   . Arthritis   . Urinary retention     Self-catheterization when necessary  . Rotator cuff tear     Right - not repaired  . Stroke 05/2012    Gait disturbance only residual - hemorrhagic stroke  . CKD (chronic kidney disease) stage 5, GFR less than 15 ml/min     Previously on hemodialysis  . Pulmonary hypertension     Severe  . Chronotropic incompetence     Suspected based on GXT  . PUD (peptic ulcer disease)     In his late 78s    Past Surgical History  Procedure Laterality Date  . Knee surgery      Tendon repair  . Eye surgery Bilateral     Cataract  . Insertion of dialysis catheter Right 06/30/2012    Procedure: INSERTION OF DIALYSIS CATHETER right IJ;  Surgeon: Rosetta Posner, MD;  Location: [facility];  Service: Vascular;  Laterality: Right;  . Tonsillectomy    . Appendectomy      There were no vitals filed for this visit.  Visit Diagnosis:  History  of fall  Poor posture  Proximal muscle weakness  Abnormality of gait  Impaired functional mobility and activity tolerance  Unsteadiness      Subjective Assessment - 08/21/14 1432    Subjective Patient reports that he still doesn't feel like PT has helped him much, feels like OT has helped him more; requests that today be his last day with skilled PT services    Pertinent History Patient had a fall recently, about 5 weeks ago; he was trying to get something out of his barn and got his feet tangled up in an electrical cord. No fractures. this was the first big fall he has had recently.    Currently in Pain? No/denies                         [facility] Adult PT Treatment/Exercise - 08/21/14 1436    Ambulation/Gait   Stairs Yes   Stairs Assistance 4: Min guard   Stair Management Technique Two rails;Alternating pattern;Forwards   Number of Stairs --  7 four inch steps, 4 seven inch steps    Gait Comments difficulty with eccentric control, required cues for safety with foot placement    Knee/Hip Exercises: Aerobic   [facility]  3 level 2 x8 minutes    Knee/Hip Exercises: Standing   Heel Raises --   Hip Extension --   Rocker Board Limitations --   Other Standing Knee Exercises Sit to stand with eccentric lower 1x10   Knee/Hip Exercises: Supine   Bridges 15 reps   Straight Leg Raises Both;1 set;10 reps   Knee/Hip Exercises: Sidelying   Clams Both 10 reps              Balance Exercises - 08/21/14 1437    Balance Exercises: Standing   Standing Eyes Closed Narrow base of support (BOS);Foam/compliant surface;3 reps;20 secs   Tandem Stance Eyes closed;Foam/compliant surface;3 reps;20 secs   SLS --   Retro Gait --           PT Education - 08/21/14 1803    Education provided Yes   Education Details DC today    Person(s) Educated Patient   Methods Explanation   Comprehension Verbalized understanding          PT Short Term Goals - 08/15/14 1133     PT SHORT TERM GOAL #1   Title Patient will demonstrate a score of at least 42 on Berg balance test in order to reduce overall fall risk    Time 4   Period Weeks   Status Achieved   PT SHORT TERM GOAL #2   Title Patient will demonstrate at least 4-/5 proximal muscle strength and 5/5 strength in bilateral lower extremities    Baseline all except hip extension which is at 3-/5    Time 4   Status Achieved   PT SHORT TERM GOAL #3   Title Patient will demonstrate at least 30 degrees bilateral hip IR and 45 degrees bilateral hip ER    Time 4   Status Achieved   PT SHORT TERM GOAL #4   Title Patient will demonstrate improved functional activity tolerance as evidenced by an ability to ambulate at least 669f during 6 minute walk test with no assistive device    Baseline 600 ft on 08/15/2014   Time 4   Period Weeks   Status On-going           PT Long Term Goals - 08/16/14 1837    PT LONG TERM GOAL #1   Title Patient will be independent in correctly and consistently performing appropriate HEP    Status On-going   PT LONG TERM GOAL #2   Title Patient will demonstrate reduced fall risk as evidenced by an increase on Berg balance scale to at least 48 points    Status On-going   PT LONG TERM GOAL #3   Title Patient will demonstrate improved gait mechanics, including improved pelvic and trunk rotation, symmetrical bilateral arm swing, improved step lengths with improved TKE, improved foot clearance during gait, and improved gait speed    Status On-going   PT LONG TERM GOAL #4   Title Patient will demonstrate improved functional activity tolerance as evidenced by an ability to ambulate at least 8572fduring 6 minute walk test with no fatigue and no assistive device    Status On-going   PT LONG TERM GOAL #5   Title Patient will demonstrate improved balance reaction skills and safety awareness as evidenced by successful navigation of obstacle courses on even and uneven surfaces and only  supervision from therapist    Status On-going   PT LONG TERM GOAL #6   Title Patient to report that he has experienced no falls in or outside  of his home within the past 8 weeks    Status On-going               Plan - 2014/08/25 1506    Clinical Impression Statement Today's session focsed on balance, functional strength, functional activity tolerance, and introduced stair training. Patient continues to demonstrate weakness in key muscle groups such as gluts, quads, and functional abductor groups today. Difficulty with stair training especially safe descent due to functional weakness and stiffness. Patientt continues to be limited by reduced functional activity tolerance as well but requests that today be his last day, as he does not feel that he is improving a great deal with skilled PT services.  DC today.    Pt will benefit from skilled therapeutic intervention in order to improve on the following deficits Abnormal gait;Decreased coordination;Decreased range of motion;Difficulty walking;Decreased safety awareness;Decreased activity tolerance;Decreased balance;Hypomobility;Impaired flexibility;Improper body mechanics;Decreased mobility;Decreased strength;Postural dysfunction   PT Next Visit Plan DC today.    PT Home Exercise Plan update given    Consulted and Agree with Plan of Care Patient          G-Codes - 08-25-2014 1508    Functional Assessment Tool Used FOTO    Functional Limitation Mobility: Walking and moving around   Mobility: Walking and Moving Around Goal Status [phone]) At least 40 percent but less than 60 percent impaired, limited or restricted   Mobility: Walking and Moving Around Discharge Status [phone]) At least 40 percent but less than 60 percent impaired, limited or restricted      Problem List Patient Active Problem List   Diagnosis Date Noted  . Melena 06/30/2014  . Anemia 06/30/2014  . Heme + stool 06/30/2014  . Constipation 11/30/2013  . Loss of weight  11/30/2013  . Abnormal CT scan, colon 11/30/2013  . Pulmonary hypertension 05/20/2013  . SSS (sick sinus syndrome) 05/06/2013  . CKD (chronic kidney disease) stage 5, GFR less than 15 ml/min 05/06/2013  . Essential hypertension, benign 06/11/2012  . Right bundle branch block   . Atrial fibrillation 02/14/2011    PHYSICAL THERAPY DISCHARGE SUMMARY  Visits from Start of Care: 14  Current functional level related to goals / functional outcomes: Patient requests that today be his last day with skilled PT services, as he feels he is not getting much better at all.    Remaining deficits: Fall risk, gait and postural impairments, generalized weakness, reduced functional activity tolerance, reduced safety awareness    Education / Equipment: Patient has already received updated HEP  Plan: Patient agrees to discharge.  Patient goals were partially met. Patient is being discharged due to the patient's request.  ?????       Deniece Ree PT, DPT [place], [place], [address] Phone: [phone]   Fax:  [phone]

## 2014-08-21 NOTE — Therapy (Signed)
[facility] [address] [place], [place], [address] Phone: [phone]   Fax:  [phone]  Occupational Therapy Treatment  Patient Details  Name: James Park MRN: [id number] Date of Birth: 12/11/1929 Referring Provider:  Eartha Inch, MD  Encounter Date: 08/21/2014      OT End of Session - 08/21/14 1458    Visit Number 8   Number of Visits 8   Date for OT Re-Evaluation 09/23/14  mini reassess on 8/9   Authorization Type BCBS Medicare   Authorization Time Period before 10th visit   Authorization - Visit Number 8   Authorization - Number of Visits 10   OT Start Time 1349   OT Stop Time 1430   OT Time Calculation (min) 41 min   Activity Tolerance Patient tolerated treatment well   Behavior During Therapy [facility] for tasks assessed/performed      Past Medical History  Diagnosis Date  . Atrial fibrillation 02/2011    Not anticoagulated  . Right bundle branch block   . Essential hypertension, benign   . Anxiety   . Depression   . Constipation   . GERD (gastroesophageal reflux disease)   . Arthritis   . Urinary retention     Self-catheterization when necessary  . Rotator cuff tear     Right - not repaired  . Stroke 05/2012    Gait disturbance only residual - hemorrhagic stroke  . CKD (chronic kidney disease) stage 5, GFR less than 15 ml/min     Previously on hemodialysis  . Pulmonary hypertension     Severe  . Chronotropic incompetence     Suspected based on GXT  . PUD (peptic ulcer disease)     In his late 62s    Past Surgical History  Procedure Laterality Date  . Knee surgery      Tendon repair  . Eye surgery Bilateral     Cataract  . Insertion of dialysis catheter Right 06/30/2012    Procedure: INSERTION OF DIALYSIS CATHETER right IJ;  Surgeon: Larina Earthly, MD;  Location: [facility] OR;  Service: Vascular;  Laterality: Right;  . Tonsillectomy    . Appendectomy      There were no vitals filed for this  visit.  Visit Diagnosis:  Shoulder weakness  Shoulder stiffness, left  Shoulder stiffness, right  Tight fascia      Subjective Assessment - 08/21/14 1458    Subjective  S: I'm feeling ok, not great though because I can't get moving.    Currently in Pain? No/denies            [facility] OT Assessment - 08/21/14 1457    Assessment   Diagnosis bilateral shoulder pain and stiffness   Precautions   Precautions Fall                  OT Treatments/Exercises (OP) - 08/21/14 1353    Exercises   Exercises Shoulder   Shoulder Exercises: Supine   Protraction PROM;AAROM;10 reps;Limitations   Protraction Limitations physical assistance from therapist with dowel   Horizontal ABduction PROM;10 reps;AAROM;5 reps;Limitations   Horizontal ABduction Limitations physical assistance from therapist with dowel   External Rotation PROM;AAROM;10 reps   Internal Rotation PROM;AAROM;10 reps   Flexion PROM;AAROM;10 reps;Limitations   Flexion Limitations physical assistance from therapist with dowel   ABduction PROM;10 reps   Shoulder Exercises: Pulleys   Flexion 1 minute   ABduction 1 minute   Manual Therapy   Manual Therapy  Myofascial release;Muscle Energy Technique   Myofascial Release Myofascial release to bilateral upper arms, trapezius, and scapularis region to decrease fascial restrictions and increase joint mobility in a pain free zone.    Muscle Energy Technique Muscle energy technique to right anterior deltoid to relax tone and muscle spasm and increase range of motion.                    OT Short Term Goals - 08/01/14 1504    OT SHORT TERM GOAL #1   Title Patient will be educated and independent with HEP.   Status On-going   OT SHORT TERM GOAL #2   Title Patient will return to highest level of independence with all basic daily and leisure tasks.   Status On-going   OT SHORT TERM GOAL #3   Title Patient will increase AROM to [facility] to increase ability to reach  into overhead cabinets with less difficulty.    Status On-going   OT SHORT TERM GOAL #4   Title Patient will decrease fascial restrictions to min amount to increase comfort level in BUE shoulder if driving.    Status On-going   OT SHORT TERM GOAL #5   Title Patient will increase BUE strength to 4-/5 to increase ability to complete daily tasks with less difficulty.    Status On-going                  Plan - 08/21/14 1458    Clinical Impression Statement A: Resumed AAROM in horizontal ab/adduction this session, pt tolerated 5 repetitions. OT provided facilitation for all AAROM exercises this session.    Plan P: Mini-reassess. Cont to work on increasing AAROM to 10 repetitions        Problem List Patient Active Problem List   Diagnosis Date Noted  . Melena 06/30/2014  . Anemia 06/30/2014  . Heme + stool 06/30/2014  . Constipation 11/30/2013  . Loss of weight 11/30/2013  . Abnormal CT scan, colon 11/30/2013  . Pulmonary hypertension 05/20/2013  . SSS (sick sinus syndrome) 05/06/2013  . CKD (chronic kidney disease) stage 5, GFR less than 15 ml/min 05/06/2013  . Essential hypertension, benign 06/11/2012  . Right bundle branch block   . Atrial fibrillation 02/14/2011    Ezra Sites, OTR/L  [phone]  08/21/2014, 3:01 PM  [facility] [address] [place], [place], [address] Phone: [phone]   Fax:  [phone]

## 2014-08-22 ENCOUNTER — Encounter (HOSPITAL_COMMUNITY): Payer: Self-pay

## 2014-08-23 ENCOUNTER — Encounter (HOSPITAL_COMMUNITY): Payer: Self-pay

## 2014-08-23 ENCOUNTER — Encounter (HOSPITAL_COMMUNITY): Payer: Self-pay | Admitting: Physical Therapy

## 2014-08-30 ENCOUNTER — Encounter (HOSPITAL_COMMUNITY): Payer: Self-pay | Admitting: Specialist

## 2014-08-30 ENCOUNTER — Ambulatory Visit (HOSPITAL_COMMUNITY): Payer: Medicare Other | Admitting: Specialist

## 2014-08-30 DIAGNOSIS — R29898 Other symptoms and signs involving the musculoskeletal system: Secondary | ICD-10-CM | POA: Diagnosis not present

## 2014-08-30 DIAGNOSIS — M25611 Stiffness of right shoulder, not elsewhere classified: Secondary | ICD-10-CM

## 2014-08-30 DIAGNOSIS — M25612 Stiffness of left shoulder, not elsewhere classified: Secondary | ICD-10-CM

## 2014-08-30 NOTE — Therapy (Signed)
[facility] [address] [place], [place], [address] Phone: [phone]   Fax:  [phone]  Occupational Therapy Treatment  Patient Details  Name: James Park MRN: [id number] Date of Birth: [birth year] Referring Provider:  Eartha Inch, MD  Encounter Date: 08/30/2014      OT End of Session - 08/30/14 1420    Visit Number 9   Number of Visits 16   Date for OT Re-Evaluation 09/23/14   Authorization Type BCBS Medicare   Authorization Time Period before 19th visit   Authorization - Visit Number 9   Authorization - Number of Visits 19   OT Start Time 1354   OT Stop Time 1440   OT Time Calculation (James) 46 James   Activity Tolerance Patient tolerated treatment well   Behavior During Therapy [facility] for tasks assessed/performed      Past Medical History  Diagnosis Date  . Atrial fibrillation 02/2011    Not anticoagulated  . Right bundle branch block   . Essential hypertension, benign   . Anxiety   . Depression   . Constipation   . GERD (gastroesophageal reflux disease)   . Arthritis   . Urinary retention     Self-catheterization when necessary  . Rotator cuff tear     Right - not repaired  . Stroke 05/2012    Gait disturbance only residual - hemorrhagic stroke  . CKD (chronic kidney disease) stage 5, GFR less than 15 ml/James     Previously on hemodialysis  . Pulmonary hypertension     Severe  . Chronotropic incompetence     Suspected based on GXT  . PUD (peptic ulcer disease)     In his late 18s    Past Surgical History  Procedure Laterality Date  . Knee surgery      Tendon repair  . Eye surgery Bilateral     Cataract  . Insertion of dialysis catheter Right 06/30/2012    Procedure: INSERTION OF DIALYSIS CATHETER right IJ;  Surgeon: Larina Earthly, MD;  Location: [facility] OR;  Service: Vascular;  Laterality: Right;  . Tonsillectomy    . Appendectomy      There were no vitals filed for this visit.  Visit Diagnosis:   Shoulder weakness  Shoulder stiffness, left  Shoulder stiffness, right      Subjective Assessment - 08/30/14 1356    Subjective  S:  My left shoulder is a little sore today.   Pain Score 5    Pain Location Shoulder   Pain Orientation Right;Left   Pain Descriptors / Indicators Sore   Pain Type Chronic pain            [facility] OT Assessment - 08/30/14 0001    Assessment   Diagnosis bilateral shoulder pain and stiffness   Precautions   Precautions Fall   Restrictions   Weight Bearing Restrictions No   AROM   Overall AROM Comments Assessed seated. IR/ER adducted. (07/25/14)   Right Shoulder Flexion 80 Degrees  80   Right Shoulder ABduction 68 Degrees  70   Right Shoulder Internal Rotation 60 Degrees  78   Right Shoulder External Rotation 70 Degrees  60   Left Shoulder Flexion 68 Degrees  75   Left Shoulder ABduction 65 Degrees  67   Left Shoulder Internal Rotation 90 Degrees  90   Left Shoulder External Rotation 30 Degrees  45   Strength   Overall Strength Comments strength was 3-/5 at initial evaluation  Right Shoulder Flexion 3+/5   Right Shoulder ABduction 3+/5   Right Shoulder Internal Rotation 3+/5   Right Shoulder External Rotation 3+/5   Left Shoulder Flexion 3+/5   Left Shoulder ABduction 3+/5   Left Shoulder Internal Rotation 3+/5   Left Shoulder External Rotation 3+/5                  OT Treatments/Exercises (OP) - 08/30/14 0001    Exercises   Exercises Shoulder   Shoulder Exercises: Supine   Protraction PROM;5 reps   External Rotation PROM;5 reps   Internal Rotation PROM;5 reps   Flexion PROM;5 reps   ABduction PROM;5 reps   Shoulder Exercises: Seated   Extension Theraband;10 reps   Theraband Level (Shoulder Extension) Level 2 (Red)   Retraction Theraband;10 reps   Theraband Level (Shoulder Retraction) Level 2 (Red)   Row Theraband;10 reps   Theraband Level (Shoulder Row) Level 2 (Red)   Protraction AAROM;10 reps   Horizontal  ABduction AAROM;5 reps;Limitations   Horizontal ABduction Limitations hand over hand assist from OT   External Rotation AAROM;10 reps   Internal Rotation AAROM;10 reps   Flexion AAROM;5 reps;Limitations   Flexion Limitations hand over hand assist from OT   Abduction AAROM;10 reps   Manual Therapy   Manual Therapy Myofascial release   Myofascial Release Myofascial release to bilateral upper arms, trapezius, and scapularis region to decrease fascial restrictions and increase joint mobility in a pain free zone.                   OT Short Term Goals - 08/30/14 1414    OT SHORT TERM GOAL #1   Title Patient will be educated and independent with HEP.   Status On-going   OT SHORT TERM GOAL #2   Title Patient will return to highest level of independence with all basic daily and leisure tasks.   Status On-going   OT SHORT TERM GOAL #3   Title Patient will increase AROM to [facility] to increase ability to reach into overhead cabinets  and comb hair and don coat with less difficulty.    Status On-going   OT SHORT TERM GOAL #4   Title Patient will decrease fascial restrictions to James amount to increase comfort level in BUE shoulder if driving.    Status On-going   OT SHORT TERM GOAL #5   Title Patient will increase BUE strength to 4-/5 to increase ability to complete daily tasks with less difficulty.    Status On-going                  Plan - 08/30/14 1459    Clinical Impression Statement A: Patient continues to have limitied AROM in bilateral shoulders.  Patient states he has good days and bad days, depending on fatigue levels. Today, he reports, is a bad day.  His strength has improved from 3-/5 to 3+/5 in bilateral shoulders.  He is making slow progress towards his goals, however, I would like to continue with his skilled OT intervention as this is a chronic problem and progress tends to be slower.  We will contnuees skilled OT intervention 2 times per week to improve BUE  functional mobility so that he can comb his hair, turn the steering wheel of his car, and put on his coat.     OT Frequency 2x / week   OT Duration 4 weeks   OT Treatment/Interventions Self-care/ADL training;Therapeutic exercise;Patient/family education;Manual Therapy;Neuromuscular education;Ultrasound;Therapeutic activities;Cryotherapy;DME and/or AE instruction;Electrical Stimulation;Moist  Heat;Passive range of motion   Plan P:  Increase repetitions of AAROM to 10 in seated, continue scapular strengthening activities to improve strength and stabillity needed to reach overhead and to shoulder height.    Consulted and Agree with Plan of Care Patient          G-Codes - 09-16-2014 1420    Functional Assessment Tool Used clinical judgement   Functional Limitation Carrying, moving and handling objects   Carrying, Moving and Handling Objects Current Status [id number]) At least 60 percent but less than 80 percent impaired, limited or restricted   Carrying, Moving and Handling Objects Goal Status (U0454) At least 20 percent but less than 40 percent impaired, limited or restricted      Problem List Patient Active Problem List   Diagnosis Date Noted  . Melena 06/30/2014  . Anemia 06/30/2014  . Heme + stool 06/30/2014  . Constipation 11/30/2013  . Loss of weight 11/30/2013  . Abnormal CT scan, colon 11/30/2013  . Pulmonary hypertension 05/20/2013  . SSS (sick sinus syndrome) 05/06/2013  . CKD (chronic kidney disease) stage 5, GFR less than 15 ml/James 05/06/2013  . Essential hypertension, benign 06/11/2012  . Right bundle branch block   . Atrial fibrillation 02/14/2011    Shirlean Mylar, OTR/L [phone]  16-Sep-2014, 3:04 PM  [facility] [address] [place], [place], [address] Phone: [phone]   Fax:  [phone]  Occupational Therapy Progress Note  Dates of Reporting Period: 07/25/14 to Sep 16, 2014  Objective Reports of Subjective  Statement: I cant put my coat on, comb my hair, or steer the car.  Objective Measurements: see above  Goal Update: see above  Plan: Patient continues to have limitied AROM in bilateral shoulders.  Patient states he has good days and bad days, depending on fatigue levels. Today, he reports, is a bad day.  His strength has improved from 3-/5 to 3+/5 in bilateral shoulders.  He is making slow progress towards his goals, however, I would like to continue with his skilled OT intervention as this is a chronic problem and progress tends to be slower.  We will contnuees skilled OT intervention 2 times per week to improve BUE functional mobility so that he can comb his hair, turn the steering wheel of his car, and put on his coat.    Reason Skilled Services are Required: Patient unable to self range, has limited AROM and strength in bilateral shoulders which is causing a decrease in independence with daily activities.  Shirlean Mylar, OTR/L [phone]

## 2014-09-01 ENCOUNTER — Encounter (HOSPITAL_COMMUNITY): Payer: Self-pay

## 2014-09-05 ENCOUNTER — Ambulatory Visit (HOSPITAL_COMMUNITY): Payer: Medicare Other

## 2014-09-05 ENCOUNTER — Encounter (HOSPITAL_COMMUNITY): Payer: Self-pay

## 2014-09-05 DIAGNOSIS — M25611 Stiffness of right shoulder, not elsewhere classified: Secondary | ICD-10-CM

## 2014-09-05 DIAGNOSIS — R29898 Other symptoms and signs involving the musculoskeletal system: Secondary | ICD-10-CM | POA: Diagnosis not present

## 2014-09-05 DIAGNOSIS — M25612 Stiffness of left shoulder, not elsewhere classified: Secondary | ICD-10-CM

## 2014-09-05 NOTE — Therapy (Signed)
[place] [address] [place], [place], [address] Phone: [phone]   Fax:  [phone]  Occupational Therapy Treatment  Patient Details  Name: James Park MRN: [id number] Date of Birth: Dec 13, 1929 Referring Provider:  Eartha Inch, MD  Encounter Date: 09/05/2014      OT End of Session - 09/05/14 1529    Visit Number 10   Number of Visits 16   Date for OT Re-Evaluation 09/23/14   Authorization Type BCBS Medicare   Authorization Time Period before 19th visit   Authorization - Visit Number 10   Authorization - Number of Visits 19   OT Start Time 1435   OT Stop Time 1515   OT Time Calculation (min) 40 min   Activity Tolerance Patient tolerated treatment well   Behavior During Therapy [facility] for tasks assessed/performed      Past Medical History  Diagnosis Date  . Atrial fibrillation 02/2011    Not anticoagulated  . Right bundle branch block   . Essential hypertension, benign   . Anxiety   . Depression   . Constipation   . GERD (gastroesophageal reflux disease)   . Arthritis   . Urinary retention     Self-catheterization when necessary  . Rotator cuff tear     Right - not repaired  . Stroke 05/2012    Gait disturbance only residual - hemorrhagic stroke  . CKD (chronic kidney disease) stage 5, GFR less than 15 ml/min     Previously on hemodialysis  . Pulmonary hypertension     Severe  . Chronotropic incompetence     Suspected based on GXT  . PUD (peptic ulcer disease)     In his late 38s    Past Surgical History  Procedure Laterality Date  . Knee surgery      Tendon repair  . Eye surgery Bilateral     Cataract  . Insertion of dialysis catheter Right 06/30/2012    Procedure: INSERTION OF DIALYSIS CATHETER right IJ;  Surgeon: Larina Earthly, MD;  Location: [facility] OR;  Service: Vascular;  Laterality: Right;  . Tonsillectomy    . Appendectomy      There were no vitals filed for this visit.  Visit Diagnosis:   Shoulder weakness  Shoulder stiffness, left  Shoulder stiffness, right      Subjective Assessment - 09/05/14 1459    Subjective  S: I have trouble reaching out to the side with this left one.   Currently in Pain? No/denies                      OT Treatments/Exercises (OP) - 09/05/14 1502    Exercises   Exercises Shoulder   Shoulder Exercises: Supine   Protraction PROM;5 reps;AAROM;10 reps   Horizontal ABduction PROM;5 reps;AAROM;10 reps   External Rotation PROM;5 reps   Internal Rotation PROM;5 reps   Flexion PROM;5 reps   ABduction PROM;5 reps   Shoulder Exercises: Seated   Row Theraband;10 reps   Theraband Level (Shoulder Row) Level 2 (Red)   Protraction AAROM;10 reps   Horizontal ABduction AAROM;10 reps;Limitations   Horizontal ABduction Limitations hand over hand assist from OT   Abduction AAROM;10 reps   Manual Therapy   Manual Therapy Myofascial release   Myofascial Release Myofascial release to bilateral upper arms, trapezius, and scapularis region to decrease fascial restrictions and increase joint mobility in a pain free zone.  OT Short Term Goals - 08/30/14 1414    OT SHORT TERM GOAL #1   Title Patient will be educated and independent with HEP.   Status On-going   OT SHORT TERM GOAL #2   Title Patient will return to highest level of independence with all basic daily and leisure tasks.   Status On-going   OT SHORT TERM GOAL #3   Title Patient will increase AROM to [facility] to increase ability to reach into overhead cabinets  and comb hair and don coat with less difficulty.    Status On-going   OT SHORT TERM GOAL #4   Title Patient will decrease fascial restrictions to min amount to increase comfort level in BUE shoulder if driving.    Status On-going   OT SHORT TERM GOAL #5   Title Patient will increase BUE strength to 4-/5 to increase ability to complete daily tasks with less difficulty.    Status On-going                   Plan - 09/05/14 1529    Clinical Impression Statement A: Was able to increase repetitions to 10X for seated horizontal abduction with assistance from OT to off load weight. Pt also completed 10X seated abduction with BUE. Increased rest breaks when needed. Pt reports muscle fatigue at end of session. Unable to complete all exercises due to time constraint.    Plan P: Attempt to complete all seated AAROM exercises for 10X. Work on lateral reaching activities  with LUE to increase functional performance at home.         Problem List Patient Active Problem List   Diagnosis Date Noted  . Melena 06/30/2014  . Anemia 06/30/2014  . Heme + stool 06/30/2014  . Constipation 11/30/2013  . Loss of weight 11/30/2013  . Abnormal CT scan, colon 11/30/2013  . Pulmonary hypertension 05/20/2013  . SSS (sick sinus syndrome) 05/06/2013  . CKD (chronic kidney disease) stage 5, GFR less than 15 ml/min 05/06/2013  . Essential hypertension, benign 06/11/2012  . Right bundle branch block   . Atrial fibrillation 02/14/2011    Limmie Patricia, OTR/L,CBIS  [phone]  09/05/2014, 3:44 PM  [place] [facility] [address] [place], [place], [address] Phone: [phone]   Fax:  [phone]

## 2014-09-08 ENCOUNTER — Encounter (HOSPITAL_COMMUNITY): Payer: Self-pay

## 2014-09-08 ENCOUNTER — Ambulatory Visit (HOSPITAL_COMMUNITY): Payer: Medicare Other

## 2014-09-08 DIAGNOSIS — R29898 Other symptoms and signs involving the musculoskeletal system: Secondary | ICD-10-CM

## 2014-09-08 DIAGNOSIS — M25611 Stiffness of right shoulder, not elsewhere classified: Secondary | ICD-10-CM

## 2014-09-08 DIAGNOSIS — M25612 Stiffness of left shoulder, not elsewhere classified: Secondary | ICD-10-CM

## 2014-09-08 NOTE — Therapy (Signed)
[facility] [address] [place], [place], [address] Phone: [phone]   Fax:  [phone]  Occupational Therapy Treatment  Patient Details  Name: James Park MRN: [id number] Date of Birth: [birth year] Referring Provider:  Eartha Inch, MD  Encounter Date: 09/08/2014      OT End of Session - 09/08/14 1547    Visit Number 11   Number of Visits 16   Date for OT Re-Evaluation 09/23/14   Authorization Type BCBS Medicare   Authorization Time Period before 19th visit   Authorization - Visit Number 11   Authorization - Number of Visits 19   OT Start Time 1347   OT Stop Time 1430   OT Time Calculation (min) 43 min   Activity Tolerance Patient tolerated treatment well   Behavior During Therapy [facility] for tasks assessed/performed      Past Medical History  Diagnosis Date  . Atrial fibrillation 02/2011    Not anticoagulated  . Right bundle branch block   . Essential hypertension, benign   . Anxiety   . Depression   . Constipation   . GERD (gastroesophageal reflux disease)   . Arthritis   . Urinary retention     Self-catheterization when necessary  . Rotator cuff tear     Right - not repaired  . Stroke 05/2012    Gait disturbance only residual - hemorrhagic stroke  . CKD (chronic kidney disease) stage 5, GFR less than 15 ml/min     Previously on hemodialysis  . Pulmonary hypertension     Severe  . Chronotropic incompetence     Suspected based on GXT  . PUD (peptic ulcer disease)     In his late 46s    Past Surgical History  Procedure Laterality Date  . Knee surgery      Tendon repair  . Eye surgery Bilateral     Cataract  . Insertion of dialysis catheter Right 06/30/2012    Procedure: INSERTION OF DIALYSIS CATHETER right IJ;  Surgeon: Larina Earthly, MD;  Location: [facility] OR;  Service: Vascular;  Laterality: Right;  . Tonsillectomy    . Appendectomy      There were no vitals filed for this visit.  Visit Diagnosis:   Shoulder weakness  Shoulder stiffness, left  Shoulder stiffness, right      Subjective Assessment - 09/08/14 1425    Subjective  S: Therapist: How do your arms feel today? Patient: I don't know. They're there.   Currently in Pain? No/denies            [facility] OT Assessment - 09/08/14 1426    Assessment   Diagnosis bilateral shoulder pain and stiffness   Precautions   Precautions Fall                  OT Treatments/Exercises (OP) - 09/08/14 1426    Exercises   Exercises Shoulder   Shoulder Exercises: Supine   Protraction PROM;5 reps;Both   Horizontal ABduction PROM;5 reps;Both   External Rotation PROM;5 reps;Both   Internal Rotation PROM;5 reps;Both   Flexion PROM;5 reps;Both   ABduction PROM;5 reps;Both   Shoulder Exercises: ROM/Strengthening   UBE (Upper Arm Bike) Level 1 1' forward 1' reverse   Functional Reaching Activities   Low Level Seated low level reaching task using cones with reaching from left to right then right to left with increased time. Mod to max assist depending on level.    Manual Therapy   Manual Therapy  Myofascial release   Myofascial Release Myofascial release to bilateral upper arms, trapezius, and scapularis region to decrease fascial restrictions and increase joint mobility in a pain free zone.                   OT Short Term Goals - 08/30/14 1414    OT SHORT TERM GOAL #1   Title Patient will be educated and independent with HEP.   Status On-going   OT SHORT TERM GOAL #2   Title Patient will return to highest level of independence with all basic daily and leisure tasks.   Status On-going   OT SHORT TERM GOAL #3   Title Patient will increase AROM to [facility] to increase ability to reach into overhead cabinets  and comb hair and don coat with less difficulty.    Status On-going   OT SHORT TERM GOAL #4   Title Patient will decrease fascial restrictions to min amount to increase comfort level in BUE shoulder if driving.     Status On-going   OT SHORT TERM GOAL #5   Title Patient will increase BUE strength to 4-/5 to increase ability to complete daily tasks with less difficulty.    Status On-going                  Plan - 09/08/14 1547    Clinical Impression Statement A: Added a functional reaching task this session as patient stated that reaching out is very difficult for him to do at home. Pt completed with mod to max difficulty.    Plan P: Attempt to complete all seated AAROM exercises for 10X. Cont with lateral reaching task.        Problem List Patient Active Problem List   Diagnosis Date Noted  . Melena 06/30/2014  . Anemia 06/30/2014  . Heme + stool 06/30/2014  . Constipation 11/30/2013  . Loss of weight 11/30/2013  . Abnormal CT scan, colon 11/30/2013  . Pulmonary hypertension 05/20/2013  . SSS (sick sinus syndrome) 05/06/2013  . CKD (chronic kidney disease) stage 5, GFR less than 15 ml/min 05/06/2013  . Essential hypertension, benign 06/11/2012  . Right bundle branch block   . Atrial fibrillation 02/14/2011    Limmie Patricia, OTR/L,CBIS  [phone]  09/08/2014, 3:49 PM  [facility] [address] [place], [place], [address] Phone: [phone]   Fax:  [phone]

## 2014-09-11 ENCOUNTER — Ambulatory Visit (HOSPITAL_COMMUNITY): Payer: Medicare Other | Admitting: Specialist

## 2014-09-11 DIAGNOSIS — M25611 Stiffness of right shoulder, not elsewhere classified: Secondary | ICD-10-CM

## 2014-09-11 DIAGNOSIS — R29898 Other symptoms and signs involving the musculoskeletal system: Secondary | ICD-10-CM | POA: Diagnosis not present

## 2014-09-11 DIAGNOSIS — M25612 Stiffness of left shoulder, not elsewhere classified: Secondary | ICD-10-CM

## 2014-09-11 NOTE — Therapy (Signed)
[place] [facility] [address] [place], [place], [address] Phone: [phone]   Fax:  [phone]  Occupational Therapy Treatment  Patient Details  Name: James Park MRN: [id number] Date of Birth: Jun 20, 1929 Referring Provider:  Eartha Inch, MD  Encounter Date: 09/11/2014      OT End of Session - 09/11/14 1532    Visit Number 12   Number of Visits 16   Date for OT Re-Evaluation 09/23/14   Authorization Type BCBS Medicare   Authorization Time Period before 19th visit   Authorization - Visit Number 12   Authorization - Number of Visits 19   OT Start Time 1351   OT Stop Time 1432   OT Time Calculation (min) 41 min   Activity Tolerance Patient tolerated treatment well   Behavior During Therapy [facility] for tasks assessed/performed      Past Medical History  Diagnosis Date  . Atrial fibrillation 02/2011    Not anticoagulated  . Right bundle branch block   . Essential hypertension, benign   . Anxiety   . Depression   . Constipation   . GERD (gastroesophageal reflux disease)   . Arthritis   . Urinary retention     Self-catheterization when necessary  . Rotator cuff tear     Right - not repaired  . Stroke 05/2012    Gait disturbance only residual - hemorrhagic stroke  . CKD (chronic kidney disease) stage 5, GFR less than 15 ml/min     Previously on hemodialysis  . Pulmonary hypertension     Severe  . Chronotropic incompetence     Suspected based on GXT  . PUD (peptic ulcer disease)     In his late 44s    Past Surgical History  Procedure Laterality Date  . Knee surgery      Tendon repair  . Eye surgery Bilateral     Cataract  . Insertion of dialysis catheter Right 06/30/2012    Procedure: INSERTION OF DIALYSIS CATHETER right IJ;  Surgeon: Larina Earthly, MD;  Location: [facility] OR;  Service: Vascular;  Laterality: Right;  . Tonsillectomy    . Appendectomy      There were no vitals filed for this visit.  Visit Diagnosis:   Shoulder weakness  Shoulder stiffness, left  Shoulder stiffness, right      Subjective Assessment - 09/11/14 1352    Subjective  S:  I feel better some days than others.  no rhyme or reason to it.   Currently in Pain? No/denies            [facility] OT Assessment - 09/11/14 0001    Assessment   Diagnosis bilateral shoulder pain and stiffness   Precautions   Precautions Fall                  OT Treatments/Exercises (OP) - 09/11/14 0001    Exercises   Exercises Shoulder   Shoulder Exercises: Supine   Protraction PROM;AAROM;5 reps   Horizontal ABduction PROM;AAROM;5 reps   External Rotation PROM;AAROM;5 reps   Internal Rotation PROM;AAROM;5 reps   Flexion PROM;AAROM;5 reps   ABduction PROM;AAROM;5 reps   Shoulder Exercises: Seated   Elevation AROM;10 reps   Extension Theraband;10 reps   Theraband Level (Shoulder Extension) Level 2 (Red)   Retraction Theraband;10 reps   Theraband Level (Shoulder Retraction) Level 2 (Red)   Row Theraband;10 reps   Theraband Level (Shoulder Row) Level 2 (Red)   Shoulder Exercises: ROM/Strengthening   UBE (  Upper Arm Bike) Level 1 3' forward and 3' reverse   Functional Reaching Activities   Low Level seated, patient reached for a cone positioned to patients side to improve abduction, patient then exchanged cone to opposite hand and reached out into abduction with opposite arm to hand cone to therapist 12 times, able to reach to approx 45 degrees of abduction   Manual Therapy   Manual Therapy Myofascial release   Myofascial Release Myofascial release to bilateral upper arms, trapezius, and scapularis region to decrease fascial restrictions and increase joint mobility in a pain free zone.                   OT Short Term Goals - 08/30/14 1414    OT SHORT TERM GOAL #1   Title Patient will be educated and independent with HEP.   Status On-going   OT SHORT TERM GOAL #2   Title Patient will return to highest level of  independence with all basic daily and leisure tasks.   Status On-going   OT SHORT TERM GOAL #3   Title Patient will increase AROM to [facility] to increase ability to reach into overhead cabinets  and comb hair and don coat with less difficulty.    Status On-going   OT SHORT TERM GOAL #4   Title Patient will decrease fascial restrictions to min amount to increase comfort level in BUE shoulder if driving.    Status On-going   OT SHORT TERM GOAL #5   Title Patient will increase BUE strength to 4-/5 to increase ability to complete daily tasks with less difficulty.    Status On-going                  Plan - 09/11/14 1532    Clinical Impression Statement A:  Patient able to reach to approximately 45 degrees of abduction with bilateral arms.  Discussed treatment plan with patient, patient is not making significant improvement in AROM gains, reports that many exercises have not gotten any easier.  Focus of therapy sessions will be strengthening of available range.   Plan P:  Focus on strengthening of available range vs gaining additional range.   Consulted and Agree with Plan of Care Patient        Problem List Patient Active Problem List   Diagnosis Date Noted  . Melena 06/30/2014  . Anemia 06/30/2014  . Heme + stool 06/30/2014  . Constipation 11/30/2013  . Loss of weight 11/30/2013  . Abnormal CT scan, colon 11/30/2013  . Pulmonary hypertension 05/20/2013  . SSS (sick sinus syndrome) 05/06/2013  . CKD (chronic kidney disease) stage 5, GFR less than 15 ml/min 05/06/2013  . Essential hypertension, benign 06/11/2012  . Right bundle branch block   . Atrial fibrillation 02/14/2011    Shirlean Mylar, OTR/L [phone]  09/11/2014, 3:38 PM  [facility] [address] [place], [place], [address] Phone: [phone]   Fax:  [phone]

## 2014-09-14 ENCOUNTER — Ambulatory Visit (HOSPITAL_COMMUNITY): Payer: Medicare Other | Attending: Family Medicine | Admitting: Occupational Therapy

## 2014-09-14 ENCOUNTER — Encounter (HOSPITAL_COMMUNITY): Payer: Self-pay | Admitting: Occupational Therapy

## 2014-09-14 DIAGNOSIS — M6289 Other specified disorders of muscle: Secondary | ICD-10-CM

## 2014-09-14 DIAGNOSIS — M629 Disorder of muscle, unspecified: Secondary | ICD-10-CM | POA: Insufficient documentation

## 2014-09-14 DIAGNOSIS — R29898 Other symptoms and signs involving the musculoskeletal system: Secondary | ICD-10-CM | POA: Insufficient documentation

## 2014-09-14 DIAGNOSIS — M25611 Stiffness of right shoulder, not elsewhere classified: Secondary | ICD-10-CM | POA: Diagnosis present

## 2014-09-14 DIAGNOSIS — M25612 Stiffness of left shoulder, not elsewhere classified: Secondary | ICD-10-CM | POA: Insufficient documentation

## 2014-09-14 NOTE — Therapy (Signed)
[place] [facility] [address] [place], [place], [address] Phone: [phone]   Fax:  [phone]  Occupational Therapy Treatment  Patient Details  Name: James Park MRN: [id number] Date of Birth: May 01, 1929 Referring Provider:  Eartha Inch, MD  Encounter Date: 09/14/2014      OT End of Session - 09/14/14 1645    Visit Number 13   Number of Visits 16   Date for OT Re-Evaluation 09/23/14   Authorization Type BCBS Medicare   Authorization Time Period before 19th visit   Authorization - Visit Number 13   Authorization - Number of Visits 19   OT Start Time 1433   OT Stop Time 1516   OT Time Calculation (min) 43 min   Activity Tolerance Patient tolerated treatment well   Behavior During Therapy [facility] for tasks assessed/performed      Past Medical History  Diagnosis Date  . Atrial fibrillation 02/2011    Not anticoagulated  . Right bundle branch block   . Essential hypertension, benign   . Anxiety   . Depression   . Constipation   . GERD (gastroesophageal reflux disease)   . Arthritis   . Urinary retention     Self-catheterization when necessary  . Rotator cuff tear     Right - not repaired  . Stroke 05/2012    Gait disturbance only residual - hemorrhagic stroke  . CKD (chronic kidney disease) stage 5, GFR less than 15 ml/min     Previously on hemodialysis  . Pulmonary hypertension     Severe  . Chronotropic incompetence     Suspected based on GXT  . PUD (peptic ulcer disease)     In his late 2s    Past Surgical History  Procedure Laterality Date  . Knee surgery      Tendon repair  . Eye surgery Bilateral     Cataract  . Insertion of dialysis catheter Right 06/30/2012    Procedure: INSERTION OF DIALYSIS CATHETER right IJ;  Surgeon: James Earthly, MD;  Location: [facility] OR;  Service: Vascular;  Laterality: Right;  . Tonsillectomy    . Appendectomy      There were no vitals filed for this visit.  Visit Diagnosis:   Shoulder weakness  Shoulder stiffness, left  Shoulder stiffness, right  Tight fascia      Subjective Assessment - 09/14/14 1433    Subjective  S: These shoulders sure are feeling stiff and sore today.    Currently in Pain? Yes   Pain Score 5    Pain Location Shoulder   Pain Orientation Left;Right   Pain Descriptors / Indicators Aching   Pain Type Chronic pain            [facility] OT Assessment - 09/14/14 1642    Assessment   Diagnosis bilateral shoulder pain and stiffness   Precautions   Precautions Fall                  OT Treatments/Exercises (OP) - 09/14/14 1436    Exercises   Exercises Shoulder   Shoulder Exercises: Supine   Protraction PROM;AAROM;5 reps   Protraction Limitations --  2# dowel rod   Horizontal ABduction PROM;AAROM;5 reps   Horizontal ABduction Limitations 2# dowel rod   External Rotation PROM;AAROM;5 reps   External Rotation Limitations 2# dowel rod   Internal Rotation PROM;AAROM;5 reps   Internal Rotation Limitations 2# dowel rod   Flexion PROM;AAROM;5 reps   Flexion Limitations 2#  dowel rod   ABduction PROM;AAROM;5 reps   ABduction Limitations 2# dowel rod   Shoulder Exercises: Seated   Extension Theraband;10 reps   Theraband Level (Shoulder Extension) Level 2 (Red)   Retraction Theraband;10 reps   Theraband Level (Shoulder Retraction) Level 2 (Red)   Row Theraband;10 reps   Theraband Level (Shoulder Row) Level 2 (Red)   Shoulder Exercises: ROM/Strengthening   UBE (Upper Arm Bike) Level 1 2' forward 1.5' reverse  pt reports he is too tired to finish reverse   Manual Therapy   Manual Therapy Myofascial release   Myofascial Release Myofascial release to bilateral upper arms, trapezius, and scapularis region to decrease fascial restrictions and increase joint mobility in a pain free zone.                   OT Short Term Goals - 08/30/14 1414    OT SHORT TERM GOAL #1   Title Patient will be educated and independent  with HEP.   Status On-going   OT SHORT TERM GOAL #2   Title Patient will return to highest level of independence with all basic daily and leisure tasks.   Status On-going   OT SHORT TERM GOAL #3   Title Patient will increase AROM to [facility] to increase ability to reach into overhead cabinets  and comb hair and don coat with less difficulty.    Status On-going   OT SHORT TERM GOAL #4   Title Patient will decrease fascial restrictions to min amount to increase comfort level in BUE shoulder if driving.    Status On-going   OT SHORT TERM GOAL #5   Title Patient will increase BUE strength to 4-/5 to increase ability to complete daily tasks with less difficulty.    Status On-going                  Plan - 09/14/14 1645    Clinical Impression Statement A: Patient reports increased stiffness and pain this session. Pt required max verbal cuing and mod assist to complete AAROM exercises, 2# dowel rod used to focus on gentle strengthening of available range of motion. Reviewed treatment plan with pt.    Plan P: Complete missed exercises, focusing on strengthening in available range.         Problem List Patient Active Problem List   Diagnosis Date Noted  . Melena 06/30/2014  . Anemia 06/30/2014  . Heme + stool 06/30/2014  . Constipation 11/30/2013  . Loss of weight 11/30/2013  . Abnormal CT scan, colon 11/30/2013  . Pulmonary hypertension 05/20/2013  . SSS (sick sinus syndrome) 05/06/2013  . CKD (chronic kidney disease) stage 5, GFR less than 15 ml/min 05/06/2013  . Essential hypertension, benign 06/11/2012  . Right bundle branch block   . Atrial fibrillation 02/14/2011    James Park, OTR/L  [phone]  09/14/2014, 4:47 PM  [facility] [address] [place], [place], [address] Phone: [phone]   Fax:  [phone]

## 2014-09-19 ENCOUNTER — Encounter (HOSPITAL_COMMUNITY): Payer: Self-pay

## 2014-09-19 ENCOUNTER — Ambulatory Visit (HOSPITAL_COMMUNITY): Payer: Medicare Other

## 2014-09-19 DIAGNOSIS — M25611 Stiffness of right shoulder, not elsewhere classified: Secondary | ICD-10-CM

## 2014-09-19 DIAGNOSIS — M6289 Other specified disorders of muscle: Secondary | ICD-10-CM

## 2014-09-19 DIAGNOSIS — M629 Disorder of muscle, unspecified: Secondary | ICD-10-CM

## 2014-09-19 DIAGNOSIS — R29898 Other symptoms and signs involving the musculoskeletal system: Secondary | ICD-10-CM | POA: Diagnosis not present

## 2014-09-19 DIAGNOSIS — M25612 Stiffness of left shoulder, not elsewhere classified: Secondary | ICD-10-CM

## 2014-09-19 NOTE — Therapy (Signed)
[place], [place], [address] Phone: [phone]   Fax:  [phone]  Occupational Therapy Treatment and reassessment  Patient Details  Name: James Park MRN: [id number] Date of Birth: [birth year] Referring Provider:  Chesley Noon, MD  Encounter Date: 09/19/2014      OT End of Session - 09/19/14 1450    Visit Number 14   Number of Visits 16   Authorization Type BCBS Medicare   Authorization Time Period before 19th visit   Authorization - Visit Number 14   Authorization - Number of Visits 19   OT Start Time 1350   OT Stop Time 1440   OT Time Calculation (min) 50 min   Activity Tolerance Patient tolerated treatment well   Behavior During Therapy [facility] for tasks assessed/performed      Past Medical History  Diagnosis Date  . Atrial fibrillation 02/2011    Not anticoagulated  . Right bundle branch block   . Essential hypertension, benign   . Anxiety   . Depression   . Constipation   . GERD (gastroesophageal reflux disease)   . Arthritis   . Urinary retention     Self-catheterization when necessary  . Rotator cuff tear     Right - not repaired  . Stroke 05/2012    Gait disturbance only residual - hemorrhagic stroke  . CKD (chronic kidney disease) stage 5, GFR less than 15 ml/min     Previously on hemodialysis  . Pulmonary hypertension     Severe  . Chronotropic incompetence     Suspected based on GXT  . PUD (peptic ulcer disease)     In his late 76s    Past Surgical History  Procedure Laterality Date  . Knee surgery      Tendon repair  . Eye surgery Bilateral     Cataract  . Insertion of dialysis catheter Right 06/30/2012    Procedure: INSERTION OF DIALYSIS CATHETER right IJ;  Surgeon: Rosetta Posner, MD;  Location: [place];  Service: Vascular;  Laterality: Right;  . Tonsillectomy    . Appendectomy      There were no vitals filed for this visit.  Visit Diagnosis:  Shoulder  weakness  Shoulder stiffness, left  Shoulder stiffness, right  Tight fascia      Subjective Assessment - 09/19/14 1443    Subjective  S: My shoulders feel the same.    Currently in Pain? No/denies            [facility] OT Assessment - 09/19/14 1357    Assessment   Diagnosis bilateral shoulder pain and stiffness   Precautions   Precautions Fall   AROM   Overall AROM Comments Assessed seated. IR/ER adducted.    AROM Assessment Site Shoulder   Right/Left Shoulder Right;Left   Right Shoulder Flexion 72 Degrees  previous: 80   Right Shoulder ABduction 61 Degrees  previous: 68   Right Shoulder Internal Rotation 83 Degrees  previous: 60   Right Shoulder External Rotation 81 Degrees  previous: 70   Left Shoulder Flexion 81 Degrees  previous: 68   Left Shoulder ABduction 60 Degrees  previous: 65   Left Shoulder Internal Rotation 90 Degrees  previous: 90   Left Shoulder External Rotation 45 Degrees  previous: 30   Strength   Overall Strength Comments Assessed seated. IR/ER adducted.   Strength Assessment Site Shoulder   Right/Left Shoulder Right;Left   Right Shoulder Flexion 3-/5  same at  eval   Right Shoulder ABduction 3-/5  same at eval   Right Shoulder Internal Rotation 3/5   Right Shoulder External Rotation 3/5  at eval: 3-/5   Left Shoulder Flexion 3-/5  same at eval   Left Shoulder ABduction 3-/5  same at eval   Left Shoulder Internal Rotation 3/5  same at eval   Left Shoulder External Rotation 3/5  at eval: 3-/5                  OT Treatments/Exercises (OP) - 2014-10-14 1447    Exercises   Exercises Shoulder   Shoulder Exercises: Supine   Protraction PROM;10 reps;AROM;5 reps   Protraction Limitations Unable to achieve more than 10% range.   Horizontal ABduction PROM;10 reps   External Rotation PROM;AROM;10 reps   Internal Rotation PROM;AROM;10 reps   Flexion PROM;10 reps;AROM;5 reps   Flexion Limitations unable to achieve more than 10%  range.   ABduction PROM;10 reps   Shoulder Exercises: Seated   Elevation AROM;10 reps   Extension AROM;Theraband;10 reps   Theraband Level (Shoulder Extension) Level 2 (Red)   Retraction Theraband;10 reps   Theraband Level (Shoulder Retraction) Level 2 (Red)   Row AROM;Theraband;10 reps   Theraband Level (Shoulder Row) Level 2 (Red)   Manual Therapy   Manual Therapy Myofascial release   Myofascial Release Myofascial release to bilateral upper arms, trapezius, and scapularis region to decrease fascial restrictions and increase joint mobility in a pain free zone.                 OT Education - 10/14/2014 1443    Education provided Yes   Education Details AROM supine, scapular AROM, theraband scapular strengthening with red band   Person(s) Educated Patient   Methods Explanation;Demonstration;Handout   Comprehension Returned demonstration;Verbalized understanding          OT Short Term Goals - October 14, 2014 1452    OT SHORT TERM GOAL #1   Title Patient will be educated and independent with HEP.   Status Achieved   OT SHORT TERM GOAL #2   Title Patient will return to highest level of independence with all basic daily and leisure tasks.   Status Not Met   OT SHORT TERM GOAL #3   Title Patient will increase AROM to [facility] to increase ability to reach into overhead cabinets  and comb hair and don coat with less difficulty.    Status Not Met   OT SHORT TERM GOAL #4   Title Patient will decrease fascial restrictions to min amount to increase comfort level in BUE shoulder if driving.    Status Achieved   OT SHORT TERM GOAL #5   Title Patient will increase BUE strength to 4-/5 to increase ability to complete daily tasks with less difficulty.    Status Not Met                  Plan - 10-14-14 1450    Clinical Impression Statement A: Pt reports that his driver has moved to [place] and this will be his last tx session. Pt reports that he has noticed no improvement with  shoulders since coming to therapy. When therapist discussed specific aspects of daily life he did report that getting his shirts on and off is a little better. Pt met 2/5 therapy goals. Updated HEP and reviewed all exercises.    Plan P: D/C from therapy with HEP.           G-Codes - 10/14/14  1453    Functional Assessment Tool Used clinical judgement   Functional Limitation Carrying, moving and handling objects   Carrying, Moving and Handling Objects Goal Status [phone]) At least 20 percent but less than 40 percent impaired, limited or restricted   Carrying, Moving and Handling Objects Discharge Status [phone]) At least 60 percent but less than 80 percent impaired, limited or restricted      Problem List Patient Active Problem List   Diagnosis Date Noted  . Melena 06/30/2014  . Anemia 06/30/2014  . Heme + stool 06/30/2014  . Constipation 11/30/2013  . Loss of weight 11/30/2013  . Abnormal CT scan, colon 11/30/2013  . Pulmonary hypertension 05/20/2013  . SSS (sick sinus syndrome) 05/06/2013  . CKD (chronic kidney disease) stage 5, GFR less than 15 ml/min 05/06/2013  . Essential hypertension, benign 06/11/2012  . Right bundle branch block   . Atrial fibrillation 02/14/2011  OCCUPATIONAL THERAPY DISCHARGE SUMMARY  Visits from Start of Care: 14  Current functional level related to goals / functional outcomes: See goals above   Remaining deficits: See clinical impression statement above   Education / Equipment: Red theraband, AROM exercises, Educated patient regarding DJD and prognosis.  Plan: Patient agrees to discharge.  Patient goals were not met. Patient is being discharged due to the patient's request.  ?????and lack of progress.        Ailene Ravel, OTR/L,CBIS  [phone]  09/19/2014, 2:55 PM  [place] [address] [place], [place], [address] Phone: [phone]   Fax:  [phone]

## 2014-09-19 NOTE — Patient Instructions (Addendum)
*  Do the following laying down in bed.*  1) Shoulder Protraction    Begin with elbows by your side, slowly "punch" straight out in front of you. Repeat  5___times, each arm. Do each arm separately.    2) Shoulder Flexion  Supine:             Begin with arms at your side with thumbs pointed up, slowly raise both arms up and forward towards overhead. Repeat_5__times, each arm. Do each arm separately.       4) Internal & External Rotation    *No band* -Stand with elbows at the side and elbows bent 90 degrees. Move your forearms away from your body, then bring back inward toward the body.  Repeat _10__times    *Do the following seated on the edge of the bed.* 1) Seated Row   Sit up straight with elbows by your sides. Pull back with shoulders/elbows, keeping forearms straight, as if pulling back on the reins of a horse. Squeeze shoulder blades together. Repeat 10___times.    2) Shoulder Elevation    Sit up straight with arms by your sides. Slowly bring your shoulders up towards your ears. Repeat_10__times  (Home) Extension: Isometric / Bilateral Arm Retraction - Sitting   Facing anchor, hold hands and elbow at shoulder height, with elbow bent.  Pull arms back to squeeze shoulder blades together. Repeat 10-15 times.  Copyright  VHI. All rights reserved.   (Home) Retraction: Row - Bilateral (Anchor)   Facing anchor, arms reaching forward, pull hands toward stomach, keeping elbows bent and at your sides and pinching shoulder blades together. Repeat 10-15 times.  Copyright  VHI. All rights reserved.   (Clinic) Extension / Flexion (Assist)   Face anchor, pull arms back, keeping elbow straight, and squeze shoulder blades together. Repeat 10-15 times.   Copyright  VHI. All rights reserved.

## 2014-09-21 ENCOUNTER — Encounter (HOSPITAL_COMMUNITY): Payer: Self-pay

## 2014-09-26 ENCOUNTER — Encounter (HOSPITAL_COMMUNITY): Payer: Self-pay

## 2014-09-28 ENCOUNTER — Encounter (HOSPITAL_COMMUNITY): Payer: Self-pay

## 2014-11-02 ENCOUNTER — Other Ambulatory Visit (HOSPITAL_COMMUNITY): Payer: Self-pay | Admitting: Physician Assistant

## 2014-11-02 ENCOUNTER — Ambulatory Visit (HOSPITAL_COMMUNITY)
Admission: RE | Admit: 2014-11-02 | Discharge: 2014-11-02 | Disposition: A | Discharge status: Home / Self Care | Payer: Medicare Other | Source: Ambulatory Visit | Attending: Physician Assistant | Admitting: Physician Assistant

## 2014-11-02 DIAGNOSIS — R5383 Other fatigue: Secondary | ICD-10-CM

## 2014-11-02 DIAGNOSIS — I517 Cardiomegaly: Secondary | ICD-10-CM | POA: Insufficient documentation

## 2014-11-02 DIAGNOSIS — R0989 Other specified symptoms and signs involving the circulatory and respiratory systems: Secondary | ICD-10-CM | POA: Insufficient documentation

## 2014-11-02 DIAGNOSIS — Z87891 Personal history of nicotine dependence: Secondary | ICD-10-CM | POA: Insufficient documentation

## 2014-11-02 DIAGNOSIS — R001 Bradycardia, unspecified: Secondary | ICD-10-CM

## 2014-11-16 ENCOUNTER — Ambulatory Visit: Payer: Self-pay | Admitting: Adult Health

## 2014-11-16 ENCOUNTER — Encounter: Payer: Self-pay | Admitting: Adult Health

## 2014-11-16 ENCOUNTER — Ambulatory Visit (INDEPENDENT_AMBULATORY_CARE_PROVIDER_SITE_OTHER): Payer: Medicare Other | Admitting: Adult Health

## 2014-11-16 ENCOUNTER — Ambulatory Visit: Payer: Self-pay | Admitting: Physician Assistant

## 2014-11-16 VITALS — BP 162/62 | HR 37 | Ht 70.0 in | Wt 164.0 lb

## 2014-11-16 DIAGNOSIS — I495 Sick sinus syndrome: Secondary | ICD-10-CM

## 2014-11-16 DIAGNOSIS — N179 Acute kidney failure, unspecified: Secondary | ICD-10-CM

## 2014-11-16 DIAGNOSIS — I10 Essential (primary) hypertension: Secondary | ICD-10-CM | POA: Diagnosis not present

## 2014-11-16 DIAGNOSIS — R001 Bradycardia, unspecified: Secondary | ICD-10-CM

## 2014-11-16 NOTE — Progress Notes (Signed)
Name: James Park    DOB: 08-Oct-1929  Age: 81 y.o.  MR#: [id number]       PCP:  Eartha Inch, MD      Insurance: Payor: BLUE [employer]OSS [employer] MEDICARE / Plan: BCBS MEDICARE / Product Type: *No Product type* /   CC:   No chief complaint on file.   VS Filed Vitals:   11/16/14 1554  BP: 162/62  Pulse: 37  Height:  (1.778 m)  Weight: 164 lb (74.39 kg)  SpO2: 97%    Weights Current Weight  11/16/14 164 lb (74.39 kg)  06/30/14 162 lb (73.483 kg)  02/21/14 175 lb 9.6 oz (79.652 kg)    Blood Pressure  BP Readings from Last 3 Encounters:  11/16/14 162/62  06/30/14 138/53  02/21/14 125/76     Admit date:  (Not on file) Last encounter with RMR:  Visit date not found   Allergy Altace  Current Outpatient Prescriptions  Medication Sig Dispense Refill  . albuterol (PROVENTIL) (2.5 MG/3ML) 0.083% nebulizer solution Take 2.5 mg by nebulization every 6 (six) hours as needed for wheezing or shortness of breath.    . ALPRAZolam (XANAX) 0.25 MG tablet Take 0.25 mg by mouth at bedtime as needed for sleep.     . calcium acetate (PHOSLO) 667 MG capsule Take 667 mg by mouth 3 (three) times daily with meals. Pt said he only takes one tablet in the morning    . doxazosin (CARDURA) 1 MG tablet Take 1 mg by mouth daily.    Marland Kitchen lubiprostone (AMITIZA) 24 MCG capsule Take 1 capsule (24 mcg total) by mouth 2 (two) times daily with a meal. 60 capsule 3  . Melatonin 5 MG TABS Take 5 mg by mouth at bedtime as needed (Sleep).    . sodium bicarbonate 650 MG tablet Take 650 mg by mouth daily.     No current facility-administered medications for this visit.    Discontinued Meds:    Medications Discontinued During This Encounter  Medication Reason  . citalopram (CELEXA) 20 MG tablet Error  . lansoprazole (PREVACID) 30 MG capsule Error  . Linaclotide (LINZESS) 290 MCG CAPS capsule Error  . pantoprazole (PROTONIX) 40 MG tablet Error  . losartan (COZAAR) 50 MG tablet Error  . clindamycin  (CLEOCIN) 300 MG capsule Error  . VENTOLIN HFA 108 (90 BASE) MCG/ACT inhaler Error    Patient Active Problem List   Diagnosis Date Noted  . Melena 06/30/2014  . Anemia 06/30/2014  . Heme + stool 06/30/2014  . Constipation 11/30/2013  . Loss of weight 11/30/2013  . Abnormal CT scan, colon 11/30/2013  . Pulmonary hypertension (HCC) 05/20/2013  . SSS (sick sinus syndrome) (HCC) 05/06/2013  . CKD (chronic kidney disease) stage 5, GFR less than 15 ml/min (HCC) 05/06/2013  . Essential hypertension, benign 06/11/2012  . Right bundle branch block   . Atrial fibrillation (HCC) 02/14/2011    LABS    Component Value Date/Time   NA 137 12/14/2013 1201   NA 135* 10/21/2013 1710   NA 135* 05/04/2013 2132   K 5.2 12/14/2013 1201   K 5.0 10/21/2013 1710   K 4.6 05/04/2013 2132   CL 112 12/14/2013 1201   CL 102 10/21/2013 1710   CL 96 05/04/2013 2132   CO2 14* 12/14/2013 1201   CO2 19 10/21/2013 1710   CO2 24 05/04/2013 2132   GLUCOSE 84 12/14/2013 1201   GLUCOSE 123* 10/21/2013 1710   GLUCOSE 98 05/04/2013 2132  BUN 89* 12/14/2013 1201   BUN 88* 10/21/2013 1710   BUN 97* 05/04/2013 2132   CREATININE 5.34* 12/14/2013 1201   CREATININE 4.78* 10/21/2013 1710   CREATININE 5.80* 05/04/2013 2132   CREATININE 5.43* 06/12/2012 0447   CALCIUM 8.4 12/14/2013 1201   CALCIUM 8.4 10/21/2013 1710   CALCIUM 9.2 05/04/2013 2132   GFRNONAA 10* 10/21/2013 1710   GFRNONAA 8* 05/04/2013 2132   GFRNONAA 9* 06/12/2012 0447   GFRAA 12* 10/21/2013 1710   GFRAA 9* 05/04/2013 2132   GFRAA 10* 06/12/2012 0447   CMP     Component Value Date/Time   NA 137 12/14/2013 1201   K 5.2 12/14/2013 1201   CL 112 12/14/2013 1201   CO2 14* 12/14/2013 1201   GLUCOSE 84 12/14/2013 1201   BUN 89* 12/14/2013 1201   CREATININE 5.34* 12/14/2013 1201   CREATININE 4.78* 10/21/2013 1710   CALCIUM 8.4 12/14/2013 1201   PROT 6.3 12/14/2013 1201   ALBUMIN 3.4* 12/14/2013 1201   AST 10 12/14/2013 1201   ALT <8  12/14/2013 1201   ALKPHOS 119* 12/14/2013 1201   BILITOT 0.6 12/14/2013 1201   GFRNONAA 10* 10/21/2013 1710   GFRAA 12* 10/21/2013 1710       Component Value Date/Time   WBC 3.9* 12/14/2013 1201   WBC 5.7 10/21/2013 1710   WBC 6.3 05/04/2013 2132   HGB 10.6* 12/14/2013 1201   HGB 11.4* 10/21/2013 1710   HGB 12.3* 05/04/2013 2132   HCT 31.8* 12/14/2013 1201   HCT 34.2* 10/21/2013 1710   HCT 36.9* 05/04/2013 2132   MCV 89.3 12/14/2013 1201   MCV 88.8 10/21/2013 1710   MCV 88.1 05/04/2013 2132    Lipid Panel  No results found for: CHOL, TRIG, HDL, CHOLHDL, VLDL, LDLCALC, LDLDIRECT  ABG No results found for: PHART, PCO2ART, PO2ART, HCO3, TCO2, ACIDBASEDEF, O2SAT   No results found for: TSH BNP (last 3 results) No results for input(s): BNP in the last 8760 hours.  ProBNP (last 3 results) No results for input(s): PROBNP in the last 8760 hours.  Cardiac Panel (last 3 results) No results for input(s): CKTOTAL, CKMB, TROPONINI, RELINDX in the last 72 hours.  Iron/TIBC/Ferritin/ %Sat No results found for: IRON, TIBC, FERRITIN, IRONPCTSAT   EKG Orders placed or performed in visit on 11/16/14  . EKG 12-Lead     Prior Assessment and Plan Problem List as of 11/16/2014      Cardiovascular and Mediastinum   Atrial fibrillation [facility](HCC)   Last Assessment & Plan 06/24/2013 Office Visit Written 06/28/2013  4:04 PM by Marinus MawGregg W Taylor, MD    His ventricular rates are slow. He has chronic atrial fib. He is not an anti-coagulation candidate. We discussed PPM insertion. He is not on coumadin.      Right bundle branch block   Essential hypertension, benign   Last Assessment & Plan 05/20/2013 Office Visit Written 05/20/2013  2:28 PM by Jonelle SidleSamuel G McDowell, MD    Systemic blood pressure mildly elevated today.      SSS (sick sinus syndrome) [facility](HCC)   Last Assessment & Plan 05/27/2013 Office Visit Written 05/27/2013  5:24 PM by Jonelle SidleSamuel G McDowell, MD    Patient with PAF, junctional rhythm, and  evidence of chronotropic incompetence based on GXT as detailed above. He has a fairly chronic sensation of progressive reduction in stamina and fatigue. As noted above, this is likely multifactorial, however contributed to by his rhythm as well I suspect. He has requested EP consultation to discuss  whether a pacemaker might at all positively impact his quality of life.      Pulmonary hypertension [facility])   Last Assessment & Plan 06/24/2013 Office Visit Written 06/28/2013  4:07 PM by Marinus Maw, MD    We discussed the treatment options. I have recommended right heart cath along with the possibility of vasodilator therapy. This has been discussed with him previously. He is considering his options.         Digestive   Constipation   Last Assessment & Plan 11/30/2013 Office Visit Written 12/02/2013  2:30 PM by Tiffany Kocher, PA-C    81 year old gentleman with complaints of constipation for 1 month's duration. CT back on October 9 suggested mild diverticulitis. Treated with Augmentin initially but switched to Cipro but urine culture sensitivities returned. Patient denies abdominal pain. Remote colonoscopy. Mild anemia/thrombocytopenia almost recent labs although thrombocytopenia appears to be intermittent. Given the current abdominal pain, fever will hold off on further antibiotic therapy.  Address constipation initially. Start Amitiza 8 g twice a day I will discuss CT findings with Dr. Jena Gauss decision to be made whether or not to offer patient colonoscopy for evaluation of abnormal CT findings. Further recommendations to follow.      Melena   Last Assessment & Plan 06/30/2014 Office Visit Edited 06/30/2014  2:03 PM by Anice Paganini, NP    Patient with a two-day episode of noted black stools approximately 2-3 weeks ago. Has not noticed any additional black stools or hematochezia symptoms. Patient has a history of peptic ulcer disease in his late 16s. Is not on any current PPI therapy. Black stools were  also associated with a drop in his hemoglobin. He is normally mildly anemic with hemoglobin in the mid 11 range. Repeat on 06/23/2014 by PCP noted a hemoglobin of 9.8. Denies chest pain, shortness of breath, dizziness. Does admit lightheadedness, but states this is a chronic problem for him. Given his presentation we will refer him for endoscopic evaluation I'm checking a CBC today as well as a BMP to check his kidney function as he is complaining of increased itching. We'll also start him on a PPI. His PCP started him on her tonics 40 mg daily, however he stopped taking this as a gave him diarrhea. The only other PPI covered by his insurance is Prilosec, however this has a drug interaction with his citalopram. He is refusing to take Prevacid because of "I've hurt it does bad things." We will provide him with Dexilant samples to last 2 weeks until his endoscopy can be completed. Informed him and his daughter of the importance of taking PPI given his possible upper GI bleed and history of peptic ulcer disease. They verbalized understanding.  Proceed with EGD with Dr. Jena Gauss in near future: the risks, benefits, and alternatives have been discussed with the patient in detail. The patient states understanding and desires to proceed.  The patient is not on any anticoagulants. He is on a low-dose Celexa. Conscious sedation should be adequate for his procedure.        Genitourinary   CKD (chronic kidney disease) stage 5, GFR less than 15 ml/min [facility])   Last Assessment & Plan 05/27/2013 Office Visit Edited 05/27/2013  5:26 PM by Jonelle Sidle, MD    Followed by Dr. Kristian Covey. Patient was on hemodialysis for 3 months, and was then able to stop. Reports relatively stable renal function.        Other   Loss of weight   Abnormal  CT scan, colon   Anemia   Last Assessment & Plan 06/30/2014 Office Visit Edited 06/30/2014  2:04 PM by Anice Paganini, NP    Patient with history of chronic mild anemia with a hemoglobin  of typically runs in the range low to mid 11. Recheck of his H&H proximally 1 week ago by his PCP was noted to be hemoglobin of 9.8. He had 2 days of black stools. Has a history of peptic ulcer disease. Denies any further bleeding in the past couple weeks. Today we will recheck his CBC, start him on a PPI, and refer him for endoscopic evaluation for possible upper GI bleed. His stool was heme positive on rectal exam today.  Proceed with EGD with Dr. Jena Gauss in near future: the risks, benefits, and alternatives have been discussed with the patient in detail. The patient states understanding and desires to proceed.  The patient is not on any anticoagulants. He is on a low-dose Celexa. Conscious sedation should be adequate for his procedure.      Heme + stool   Last Assessment & Plan 06/30/2014 Office Visit Written 06/30/2014  2:03 PM by Anice Paganini, NP    Rectal exam completed along with Hemoccult card which was mildly heme positive. No frank bright red blood noted on exam. Stool noted during exam was brown in color. Given his presentation, symptoms, and heme-positive stool we will start him on a PPI given his history of peptic ulcer disease, and refer him for endoscopic evaluation for possible upper GI bleed.  Proceed with EGD with Dr. Jena Gauss in near future: the risks, benefits, and alternatives have been discussed with the patient in detail. The patient states understanding and desires to proceed.  The patient is not on any anticoagulants. He is on a low-dose Celexa. Conscious sedation should be adequate for his procedure.          Imaging: Dg Chest 2 View  11/02/2014  CLINICAL DATA:  Congestion for several weeks, former smoking history, history of asthma EXAM: CHEST  2 VIEW COMPARISON:  Chest x-ray of 05/23/2016 FINDINGS: The lungs are clear but hyperaerated. and element of emphysema may be present. Mediastinal and hilar contours are unchanged. Cardiomegaly is stable. Old fractures of the right  posterolateral sixth, seventh and possibly eighth ribs are noted. There are degenerative changes in the thoracic spine. IMPRESSION: 1. No active lung disease.  Question emphysema. 2. Stable cardiomegaly. 3. Old healed right rib fractures Electronically Signed   By: Dwyane Dee M.D.   On: 11/02/2014 16:42

## 2014-11-16 NOTE — Progress Notes (Signed)
Cardiology Office Note   Date:  11/16/2014   ID:  James Park, DOB Jun 20, 1929, MRN [id number]  PCP:  Eartha Inch, MD  Cardiologist:  McDowell/ Joni Reining, NP   Chief Complaint  Patient presents with  . Bradycardia  . Fatigue      History of Present Illness: James Park is a 81 y.o. male who presents for assessment of bradycardia and fatigue. He has a history of renal failure and at the time was undergoing dialysis with a temporary catheter. He has since stopped dialysis stating he no longer wanted to continue this treatment.Heh as a history of atrial fib and refuses anticoagulation.      He has not been seen by our practice since June of 2015. He was seen then by Dr. Diona Browner, who at that time diagnosed him with SSS, and sent him to Dr. Ladona Ridgel for evaluation for PPM. According to Dr. Lubertha Basque note, he recommended a RHC for his pulmonary hypertension. He is unable to take antihypertensive medications due to renal failure. PPM insertion was discussed at that time by Dr. Ladona Ridgel in June of 2015 while he was still undergoing dialysis.     He comes today with continued fatigue. He has been seen by PCP who has done labs demonstrating BUN 11, Creatinine of 6.0. He was treated for UTI. The patient continued to refuse dialysis. EKG today demonstrates wide complex bradycardia, RBBB, with T-wave inversion, globally. His HR is 30 bpm. His daughter who is a nurse questions whether he is still a candidate for PPM now.   Past Medical History  Diagnosis Date  . Atrial fibrillation (HCC) 02/2011    Not anticoagulated  . Right bundle branch block   . Essential hypertension, benign   . Anxiety   . Depression   . Constipation   . GERD (gastroesophageal reflux disease)   . Arthritis   . Urinary retention     Self-catheterization when necessary  . Rotator cuff tear     Right - not repaired  . Stroke [facility]) 05/2012    Gait disturbance only residual - hemorrhagic stroke  . CKD (chronic  kidney disease) stage 5, GFR less than 15 ml/min (HCC)     Previously on hemodialysis  . Pulmonary hypertension (HCC)     Severe  . Chronotropic incompetence     Suspected based on GXT  . PUD (peptic ulcer disease)     In his late 73s    Past Surgical History  Procedure Laterality Date  . Knee surgery      Tendon repair  . Eye surgery Bilateral     Cataract  . Insertion of dialysis catheter Right 06/30/2012    Procedure: INSERTION OF DIALYSIS CATHETER right IJ;  Surgeon: Larina Earthly, MD;  Location: [facility] OR;  Service: Vascular;  Laterality: Right;  . Tonsillectomy    . Appendectomy       Current Outpatient Prescriptions  Medication Sig Dispense Refill  . albuterol (PROVENTIL) (2.5 MG/3ML) 0.083% nebulizer solution Take 2.5 mg by nebulization every 6 (six) hours as needed for wheezing or shortness of breath.    . ALPRAZolam (XANAX) 0.25 MG tablet Take 0.25 mg by mouth at bedtime as needed for sleep.     . calcium acetate (PHOSLO) 667 MG capsule Take 667 mg by mouth 3 (three) times daily with meals. Pt said he only takes one tablet in the morning    . doxazosin (CARDURA) 1 MG tablet Take 1 mg by mouth daily.    Marland Kitchen  lubiprostone (AMITIZA) 24 MCG capsule Take 1 capsule (24 mcg total) by mouth 2 (two) times daily with a meal. 60 capsule 3  . Melatonin 5 MG TABS Take 5 mg by mouth at bedtime as needed (Sleep).    . sodium bicarbonate 650 MG tablet Take 650 mg by mouth daily.     No current facility-administered medications for this visit.    Allergies:   Altace    Social History:  The patient  reports that he has never smoked. He does not have any smokeless tobacco history on file. He reports that he drinks about 1.2 oz of alcohol per week. He reports that he does not use illicit drugs.   Family History:  The patient's family history includes Parkinson's disease in his brother. There is no history of Colon cancer.    ROS: All other systems are reviewed and negative. Unless otherwise  mentioned in H&P    PHYSICAL EXAM: VS:  BP 162/62 mmHg  Pulse 37  Ht 5\' 10"  (1.778 m)  Wt 164 lb (74.39 kg)  BMI 23.53 kg/m2  SpO2 97% , BMI Body mass index is 23.53 kg/(m^2). GEN: Well nourished, well developed, in no acute distress HEENT: normal Neck: no JVD, carotid bruits, or masses Cardiac: RRR; bradycardic no murmurs, rubs, or gallops,no edema  Respiratory:  clear to auscultation bilaterally, normal work of breathing GI: soft, nontender, nondistended, + BS MS: no deformity or atrophy Skin: warm and dry, no rash Neuro:  Strength and sensation are intact Psych: euthymic mood, full affect   EKG:  EThe ekg ordered today demonstrates Marked sinus bradycardia, Wide complex, with HR of 30 with RBBB.    Recent Labs: 12/14/2013: ALT <8; BUN 89*; Creat 5.34*; Hemoglobin 10.6*; Platelets 150; Potassium 5.2; Sodium 137    Lipid Panel No results found for: CHOL, TRIG, HDL, CHOLHDL, VLDL, LDLCALC, LDLDIRECT    Wt Readings from Last 3 Encounters:  11/16/14 164 lb (74.39 kg)  06/30/14 162 lb (73.483 kg)  02/21/14 175 lb 9.6 oz (79.652 kg)     ASSESSMENT AND PLAN:  1.  Marked bradycardia; In speaking with the patient there is a question of whether having a PPM is still an option for him in the setting of worsening renal status and refusal to have dialysis. I do not think that having a PPM would actually make him feel better in the long term with his other co-morbidities and refusal of treatment. I have discussed the option of continuing care and comfort vs referral back to Dr. Ladona Ridgelaylor to discuss PPM placement.   The patient does not want to have a PPM placed at this time but it willing to discuss this further with his daughters and come to a final decision. Based upon what he has said, he wants to continue without invasive strategies because his kidney function will continue to worsen. I have asked them to come to a decision as whether they want to move forward with EP consult or not.  They will call us back.   I have discussed this with Dr. Purvis SheffieldKoneswaran who recommends that Dr. Ladona Ridgelaylor assist them with informed decision if the patient wishes to proceed with PPM insertion conversation or not. One this is determined, we will make appt or continue care and comfort measures only. He has signed a DNR per PCP. I have spent over 30 minutes with this patient and family discussing his bradycardia and options.   2. Hypertension: No options for antihypertensives with low HR and renal  function with the exception of hydralazine. He does not wish to have more treatments.   Current medicines are reviewed at length with the patient today.       Disposition:   FU with PRN or see Dr. Ladona Ridgel. They will call us.    Signed, Joni Reining, NP  11/16/2014 4:41 PM    [facility] [address] [place], [place] [address] Phone: [phone]; Fax: [phone]

## 2014-11-16 NOTE — Patient Instructions (Signed)
Your physician recommends that you schedule a follow-up appointment in: to be determined  Call us with your decision either way regarding decision for pacemaker     Thank you for choosing [facility] !

## 2014-11-17 ENCOUNTER — Encounter: Payer: Self-pay | Admitting: Adult Health

## 2014-12-19 ENCOUNTER — Ambulatory Visit (INDEPENDENT_AMBULATORY_CARE_PROVIDER_SITE_OTHER): Payer: Medicare Other | Admitting: Internal Medicine

## 2014-12-19 ENCOUNTER — Encounter: Payer: Self-pay | Admitting: Internal Medicine

## 2014-12-19 VITALS — BP 130/90 | HR 44 | Temp 97.0°F | Ht 70.0 in | Wt 160.8 lb

## 2014-12-19 DIAGNOSIS — K4090 Unilateral inguinal hernia, without obstruction or gangrene, not specified as recurrent: Secondary | ICD-10-CM

## 2014-12-19 DIAGNOSIS — R001 Bradycardia, unspecified: Secondary | ICD-10-CM | POA: Diagnosis not present

## 2014-12-19 DIAGNOSIS — R1032 Left lower quadrant pain: Secondary | ICD-10-CM

## 2014-12-19 NOTE — Progress Notes (Signed)
Primary Care Physician:  Eartha Inch, MD Primary Gastroenterologist:  Dr. Jena Gauss  Pre-Procedure History & Physical: HPI:  James Park is a 81 y.o. male with multiple medical problems including rather advanced renal failure (dialysis declined previously) here for follow-up. States he just feels bad. He is weak  - has no energy. He said multiple falls recently. They do not all sound mechanical. There possibly associated with a brief loss of consciousness. Constipation symptoms much improved with dietary management. He has a known left inguinal hernia which contains a knuckle of sigmoid colon. Presented this past summer with melena. We offered him an EGD. A fall at that time precluded performing the procedure. He has declined to have it rescheduled. He denies hematochezia, melena or vomiting at this time. He does not have postprandial abdominal pain.   Past Medical History  Diagnosis Date  . Atrial fibrillation (HCC) 02/2011    Not anticoagulated  . Right bundle branch block   . Essential hypertension, benign   . Anxiety   . Depression   . Constipation   . GERD (gastroesophageal reflux disease)   . Arthritis   . Urinary retention     Self-catheterization when necessary  . Rotator cuff tear     Right - not repaired  . Stroke [facility]) 05/2012    Gait disturbance only residual - hemorrhagic stroke  . CKD (chronic kidney disease) stage 5, GFR less than 15 ml/min (HCC)     Previously on hemodialysis  . Pulmonary hypertension (HCC)     Severe  . Chronotropic incompetence     Suspected based on GXT  . PUD (peptic ulcer disease)     In his late 29s    Past Surgical History  Procedure Laterality Date  . Knee surgery      Tendon repair  . Eye surgery Bilateral     Cataract  . Insertion of dialysis catheter Right 06/30/2012    Procedure: INSERTION OF DIALYSIS CATHETER right IJ;  Surgeon: Larina Earthly, MD;  Location: [facility] OR;  Service: Vascular;  Laterality: Right;  . Tonsillectomy     . Appendectomy      Prior to Admission medications   Medication Sig Start Date End Date Taking? Authorizing Provider  albuterol (PROVENTIL) (2.5 MG/3ML) 0.083% nebulizer solution Take 2.5 mg by nebulization every 6 (six) hours as needed for wheezing or shortness of breath.   Yes Historical Provider, MD  ALPRAZolam Prudy Feeler) 0.25 MG tablet Take 0.25 mg by mouth at bedtime as needed for sleep.    Yes Historical Provider, MD  calcium acetate (PHOSLO) 667 MG capsule Take 667 mg by mouth 3 (three) times daily with meals. Pt said he only takes one tablet in the morning 05/23/13  Yes Historical Provider, MD  doxazosin (CARDURA) 1 MG tablet Take 1 mg by mouth daily.   Yes Historical Provider, MD  Melatonin 5 MG TABS Take 5 mg by mouth at bedtime as needed (Sleep).   Yes Historical Provider, MD  sodium bicarbonate 650 MG tablet Take 650 mg by mouth daily.   Yes Historical Provider, MD  lubiprostone (AMITIZA) 24 MCG capsule Take 1 capsule (24 mcg total) by mouth 2 (two) times daily with a meal. Patient not taking: Reported on 12/19/2014 12/02/13   Tiffany Kocher, PA-C    Allergies as of 12/19/2014 - Review Complete 12/19/2014  Allergen Reaction Noted  . Altace [ramipril] Hives 06/11/2012    Family History  Problem Relation Age of Onset  .  Parkinson's disease Brother   . Colon cancer Neg Hx     Social History   Social History  . Marital Status: Divorced    Spouse Name: N/A  . Number of Children: 2  . Years of Education: N/A   Occupational History  . Not on file.   Social History Main Topics  . Smoking status: Never Smoker   . Smokeless tobacco: Not on file     Comment: quit 1967  . Alcohol Use: 1.2 oz/week    2 Cans of beer per week     Comment: 2 cans of beer daily  . Drug Use: No  . Sexual Activity: Not on file   Other Topics Concern  . Not on file   Social History Narrative    Review of Systems: See HPI, otherwise negative ROS  Physical Exam: BP 130/90 mmHg  Pulse 44   Temp(Src) 97 F (36.1 C)  Ht 5\' 10"  (1.778 m)  Wt 160 lb 12.8 oz (72.938 kg)  BMI 23.07 kg/m2 General:   Gaunt appearing chronically ill, pleasant and cooperative in NAD Skin:  No jaundice Eyes:  Sclera clear, no icterus.   Conjunctiva pink. Neck:  Supple; no masses or thyromegaly. No significant cervical adenopathy. Lungs:  Clear throughout to auscultation.   No wheezes, crackles, or rhonchi. No acute distress. Heart:  Irregular rhythm rhythm; no murmurs, clicks, rubs,  or gallops. Abdomen: Non-distended, normal bowel sounds.  No bruits. Soft and nontender without appreciable mass or hepatosplenomegaly. I do not appreciate a hernia today. Pulses:  Normal pulses noted. Extremities:  Without clubbing or edema.  Impression:  Pleasant 81 year old gentleman with chronic kidney disease, previously declined dialysis, multiple other comorbidities including pulmonary hypertension atrial fibrillation, sick sinus syndrome presenting with constitutional symptoms of weakness and occasional left lower quadrant abdominal pain. Constipation appears not to be playing a role although now although it has been a significant problem previously. History of self-limiting melena.   Hemoccult-positive stool this past summer. EGD not done. Patient declines further evaluation.  Recurrent falls. They do not necessarily sound mechanical. Wonder about orthostatic hypotension in the setting of sick sinus syndrome. Query cardiogenic syncope. His heart rate by my exam today  - at best - 40.    I suspect much of his recent symptomatology (GI non-GI) may be related to bradycardia with hypoperfusion in in the setting of worsening renal failure.  I do not feel he needs further GI evaluation at this time. However, he certainly needs further cardiology evaluation. He tells me he does not want to go back to his PCP at this time. Also, he wishes to see a new cardiologist.  Recommendations:  I told him I would make every effort to  get him an expedited appointment to see Dr. Ladona Ridgelaylor at heart clinic.  Will see what the cardiologist as he did get him back here for follow-up.       Notice: This dictation was prepared with Dragon dictation along with smaller phrase technology. Any transcriptional errors that result from this process are unintentional and may not be corrected upon review.

## 2014-12-19 NOTE — Patient Instructions (Signed)
Will hold off further GI testing until heart evaluated further  We will make you an appointment to see Dr. Merry ProudKonswaren at   The heart center [facility]  Will plan to see you back after heart evaluation complete

## 2014-12-20 ENCOUNTER — Other Ambulatory Visit: Payer: Self-pay

## 2014-12-20 DIAGNOSIS — R933 Abnormal findings on diagnostic imaging of other parts of digestive tract: Secondary | ICD-10-CM

## 2015-01-01 ENCOUNTER — Encounter: Payer: Self-pay | Admitting: Internal Medicine

## 2015-01-01 ENCOUNTER — Ambulatory Visit (INDEPENDENT_AMBULATORY_CARE_PROVIDER_SITE_OTHER): Payer: Medicare Other | Admitting: Internal Medicine

## 2015-01-01 VITALS — BP 132/58 | HR 64 | Ht 71.0 in | Wt 160.0 lb

## 2015-01-01 DIAGNOSIS — I10 Essential (primary) hypertension: Secondary | ICD-10-CM

## 2015-01-01 DIAGNOSIS — D509 Iron deficiency anemia, unspecified: Secondary | ICD-10-CM

## 2015-01-01 DIAGNOSIS — R001 Bradycardia, unspecified: Secondary | ICD-10-CM

## 2015-01-01 DIAGNOSIS — I482 Chronic atrial fibrillation, unspecified: Secondary | ICD-10-CM

## 2015-01-01 NOTE — Assessment & Plan Note (Signed)
His blood pressure is minimally elevated. He will continue his current meds.

## 2015-01-01 NOTE — Patient Instructions (Signed)
Your physician recommends that you schedule a follow-up appointment in: As Needed  Your physician recommends that you continue on your current medications as directed. Please refer to the Current Medication list given to you today.  Thank you for choosing [facility]!    

## 2015-01-01 NOTE — Assessment & Plan Note (Signed)
The last HGB we have was from a year ago and was 10. If it is less than 9, he should be considered for EPO. I discussed this with his daughter and recommended he be referred back to his primary MD which is now Dr[place] in [place].

## 2015-01-01 NOTE — Progress Notes (Signed)
HPI [name] is referred today for consideration for PPM insertion. He is an 81 yo man with chronotropic incompetence, pulmonary HTN, and RBBB. He has felt bad for 3-4 years. He also has severe renal insufficiency and underwent insertion of a dialysis catheter. He has decided not to undergo HD having done it for a few months. He still makes urine. His daughter states that his creatinine is now 7.He has been recommended to have a right heart cath to understand the severity of his pulmonary HTN. He has mild peripheral edema. His blood pressure remains elevated but has been unable to take many anti-HTN meds because of bradycardia. When I saw him last 18 months ago, I was not sure he would benefit from a PPM. He remains bradycardic and in atrial fib. He fell 2 weeks ago getting out of bed. He does not think he lost consciousness.  Allergies  Allergen Reactions  . Altace [Ramipril] Hives     Current Outpatient Prescriptions  Medication Sig Dispense Refill  . albuterol (PROVENTIL) (2.5 MG/3ML) 0.083% nebulizer solution Take 2.5 mg by nebulization every 6 (six) hours as needed for wheezing or shortness of breath.    . ALPRAZolam (XANAX) 0.25 MG tablet Take 0.25 mg by mouth at bedtime as needed for sleep.     . calcium acetate (PHOSLO) 667 MG capsule Take 667 mg by mouth 3 (three) times daily with meals. Pt said he only takes one tablet in the morning    . doxazosin (CARDURA) 1 MG tablet Take 1 mg by mouth daily.    . Melatonin 5 MG TABS Take 5 mg by mouth at bedtime as needed (Sleep).    . sodium bicarbonate 650 MG tablet Take 650 mg by mouth daily.     No current facility-administered medications for this visit.     Past Medical History  Diagnosis Date  . Atrial fibrillation (HCC) 02/2011    Not anticoagulated  . Right bundle branch block   . Essential hypertension, benign   . Anxiety   . Depression   . Constipation   . GERD (gastroesophageal reflux disease)   . Arthritis   .  Urinary retention     Self-catheterization when necessary  . Rotator cuff tear     Right - not repaired  . Stroke [facility](HCC) 05/2012    Gait disturbance only residual - hemorrhagic stroke  . CKD (chronic kidney disease) stage 5, GFR less than 15 ml/min (HCC)     Previously on hemodialysis  . Pulmonary hypertension (HCC)     Severe  . Chronotropic incompetence     Suspected based on GXT  . PUD (peptic ulcer disease)     In his late 4850s    ROS:   All systems reviewed and negative except as noted in the HPI.   Past Surgical History  Procedure Laterality Date  . Knee surgery      Tendon repair  . Eye surgery Bilateral     Cataract  . Insertion of dialysis catheter Right 06/30/2012    Procedure: INSERTION OF DIALYSIS CATHETER right IJ;  Surgeon: Larina Earthlyodd F Early, MD;  [facility];  Service: Vascular;  Laterality: Right;  . Tonsillectomy    . Appendectomy       Family History  Problem Relation Age of Onset  . Parkinson's disease Brother   . Colon cancer Neg Hx      Social History   Social History  . Marital Status: Divorced  Spouse Name: N/A  . Number of Children: 2  . Years of Education: N/A   Occupational History  . Not on file.   Social History Main Topics  . Smoking status: Never Smoker   . Smokeless tobacco: Not on file     Comment: quit 1967  . Alcohol Use: 1.2 oz/week    2 Cans of beer per week     Comment: 2 cans of beer daily  . Drug Use: No  . Sexual Activity: Not on file   Other Topics Concern  . Not on file   Social History Narrative     BP 132/58 mmHg  Pulse 64  Ht  (1.803 m)  Wt 160 lb (72.576 kg)  BMI 22.33 kg/m2  SpO2 99%  Physical Exam:  Chronically ill appearing elderly man, NAD HEENT: Unremarkable Neck:  No JVD, no thyromegally Back:  No CVA tenderness Lungs:  Clear with minimal basilar rales.  HEART:  IRegular brady rhythm, 2/6 systolic murmur, no rubs, no clicks Abd:  soft, positive bowel sounds, no organomegally, no  rebound, no guarding Ext:  2 plus pulses, no edema, no cyanosis, no clubbing Skin:  No rashes no nodules Neuro:  CN II through XII intact, motor grossly intact   Assess/Plan:

## 2015-01-01 NOTE — Assessment & Plan Note (Signed)
He has a slow VR. We discussed permanent PM insertion. At this point, I do not think he is a candidate for PPM insertion or anti-coagulation. The risk/benefit do not suggest either would be good for him.

## 2015-05-11 ENCOUNTER — Ambulatory Visit (INDEPENDENT_AMBULATORY_CARE_PROVIDER_SITE_OTHER): Payer: Medicare Other | Admitting: Internal Medicine

## 2015-05-11 ENCOUNTER — Encounter: Payer: Self-pay | Admitting: Internal Medicine

## 2015-05-11 ENCOUNTER — Telehealth: Payer: Self-pay

## 2015-05-11 VITALS — BP 100/70 | HR 66 | Temp 98.1°F | Ht 70.0 in | Wt 157.2 lb

## 2015-05-11 DIAGNOSIS — R1013 Epigastric pain: Secondary | ICD-10-CM | POA: Diagnosis not present

## 2015-05-11 DIAGNOSIS — K219 Gastro-esophageal reflux disease without esophagitis: Secondary | ICD-10-CM

## 2015-05-11 DIAGNOSIS — K5909 Other constipation: Secondary | ICD-10-CM

## 2015-05-11 DIAGNOSIS — R1310 Dysphagia, unspecified: Secondary | ICD-10-CM | POA: Diagnosis not present

## 2015-05-11 NOTE — Patient Instructions (Signed)
Begin protonix 40 mg daiy  Use Carnation instant breakfast twice daily as a dietary supplement  Let me know how you are doing in 1 week  Office visit in 2 months

## 2015-05-11 NOTE — Progress Notes (Signed)
Primary Care Physician:  Chesley Noon, MD Primary Gastroenterologist:  Dr. Gala Romney  Pre-Procedure History & Physical: HPI:  James Park is a 82 y.o. male here for reflux, abdominal pain  -  particularly when he takes his sodium bicarbonate tablet. Patient states he is supposed to take 3 a day but takes one. He tells me his nephrologist is not aware of his alteration in regimen. Sees both Dr. Melford Aase and Dr. Willey Blade. Long-standing history of reflux disease  -  he describes vague intermittent esophageal dysphagia to solids. Happens more when he gets upset. Has had a significant problem with left inguinal hernia containing a loop of sigmoid colon (not a surgical candidate and e declined to try a truss). Constipation now well controlled with diet fiber fortified with vegetables like broccoli and corn. Denies melena or rectal bleeding.  Prior history of melena  -  EGD and colonoscopy were recommended, however, the patient declined. He did see Dr. Lovena Le for bradycardia and possible cardiogenic syncope. He did not feel a pacemaker or anticoagulation therapy was warranted, etc. He continues to refuse dialysis. Labs from nephrology actually noted improvement in his creatinine from 6-4 since last fall.  States he'd like to gain some weight. He does eat 3 meals daily. Does not snack in between. Denies early satiety, nausea or vomiting.  He's not taking any nonsteroidals. He's not on any acid suppression therapy.  Past Medical History  Diagnosis Date  . Atrial fibrillation ([place]) 02/2011    Not anticoagulated  . Right bundle branch block   . Essential hypertension, benign   . Anxiety   . Depression   . Constipation   . GERD (gastroesophageal reflux disease)   . Arthritis   . Urinary retention     Self-catheterization when necessary  . Rotator cuff tear     Right - not repaired  . Stroke [facility]) 05/2012    Gait disturbance only residual - hemorrhagic stroke  . CKD (chronic kidney disease) stage 5,  GFR less than 15 ml/min (HCC)     Previously on hemodialysis  . Pulmonary hypertension (HCC)     Severe  . Chronotropic incompetence     Suspected based on GXT  . PUD (peptic ulcer disease)     In his late 15s    Past Surgical History  Procedure Laterality Date  . Knee surgery      Tendon repair  . Eye surgery Bilateral     Cataract  . Insertion of dialysis catheter Right 06/30/2012    Procedure: INSERTION OF DIALYSIS CATHETER right IJ;  Surgeon: Rosetta Posner, MD;  Location: [place];  Service: Vascular;  Laterality: Right;  . Tonsillectomy    . Appendectomy      Prior to Admission medications   Medication Sig Start Date End Date Taking? Authorizing Provider  albuterol (PROVENTIL) (2.5 MG/3ML) 0.083% nebulizer solution Take 2.5 mg by nebulization every 6 (six) hours as needed for wheezing or shortness of breath.   Yes Historical Provider, MD  ALPRAZolam Duanne Moron) 0.25 MG tablet Take 0.25 mg by mouth at bedtime as needed for sleep.    Yes Historical Provider, MD  calcium acetate (PHOSLO) 667 MG capsule Take 667 mg by mouth 3 (three) times daily with meals. Pt said he only takes one tablet in the morning 05/23/13  Yes Historical Provider, MD  doxazosin (CARDURA) 1 MG tablet Take 1 mg by mouth daily.   Yes Historical Provider, MD  Melatonin 5 MG TABS Take 5  mg by mouth at bedtime as needed (Sleep).   Yes Historical Provider, MD  sodium bicarbonate 650 MG tablet Take 650 mg by mouth daily. Reported on 05/11/2015   Yes Historical Provider, MD    Allergies as of 05/11/2015 - Review Complete 05/11/2015  Allergen Reaction Noted  . Altace [ramipril] Hives 06/11/2012    Family History  Problem Relation Age of Onset  . Parkinson's disease Brother   . Colon cancer Neg Hx     Social History   Social History  . Marital Status: Divorced    Spouse Name: N/A  . Number of Children: 2  . Years of Education: N/A   Occupational History  . Not on file.   Social History Main Topics  . Smoking  status: Never Smoker   . Smokeless tobacco: Not on file     Comment: quit 1967  . Alcohol Use: 1.2 oz/week    2 Cans of beer per week     Comment: 2 cans of beer daily  . Drug Use: No  . Sexual Activity: Not on file   Other Topics Concern  . Not on file   Social History Narrative    Review of Systems: See HPI, otherwise negative ROS  Physical Exam: BP 100/70 mmHg  Pulse 66  Temp(Src) 98.1 F (36.7 C)  Ht 5' 10"  (1.778 m)  Wt 157 lb 3.2 oz (71.305 kg)  BMI 22.56 kg/m2 General:   Gaunt, Chronically ill-appearing pleasant and cooperative in NAD Skin:  Intact without significant lesions or rashes. Eyes:  Sclera clear, no icterus.   Conjunctiva pink. Ears:  Normal auditory acuity. Nose:  No deformity, discharge,  or lesions. Mouth:  No deformity or lesions. Neck:  Supple; no masses or thyromegaly. No significant cervical adenopathy. Lungs:  Clear throughout to auscultation.   No wheezes, crackles, or rhonchi. No acute distress. Heart:  Regular rate and rhythm; no murmurs, clicks, rubs,  or gallops. Abdomen: Non-distended, normal bowel sounds.  Soft and nontender without appreciable mass or hepatosplenomegaly.  Pulses:  Normal pulses noted. Extremities:  Without clubbing or edema.  Impression: Pleasant 82 year old gentleman with GERD, chronic constipation in the setting of a myriad of other significant co-morbidities including pulmonary hypertension, chronic renal failure, He is describing some dyspepsia/flair in reflux symptoms around the time he takes his sodium carbonate tablet daily. He likely is having a flare reflux/dyspepsia. He may have a stricture or ring or other structural abnormality in his esophagus. He absolutely declines any further evaluation including endoscopic evaluation, barium evaluation. Lack of weight gain likely multifactorial in etiology. Effective management of reflux may, in part, help that to some degree.  Constipation appears not to be a significant  issue at this time. Left inguinal hernia   -  appears to be quiscent at this time.    Recommendations:    Begin protonix 40 mg daiy  Use Carnation instant breakfast twice daily as a dietary supplement  Patient is to call in 1 week give [place] a progress report.  I told the patient if he develops severe left lower quadrant abdominal pain  -  this could be an indicator of an incarcerated inguinal hernia. If that were to occur, I recommended he proceed to the nearest emergency room.  Office visit in 2 months       Notice: This dictation was prepared with Dragon dictation along with smaller phrase technology. Any transcriptional errors that result from this process are unintentional and may not be corrected upon review.

## 2015-05-11 NOTE — Telephone Encounter (Signed)
thanks

## 2015-05-11 NOTE — Telephone Encounter (Signed)
Per Dr. Jena Gaussourk Rx called to [facility] to [place].   Protonix 40 mg one daily #30 Refills 11

## 2015-05-21 ENCOUNTER — Telehealth: Payer: Self-pay | Admitting: Internal Medicine

## 2015-05-21 NOTE — Telephone Encounter (Signed)
Spoke with the pt, he said the protonix is not working at all. He said he even woke up this morning at 5am with terrible reflux and that he has never had that happen to him before. He has never taken any reflux medications in the past except protonix. He wants to know if we can send in something else.

## 2015-05-21 NOTE — Telephone Encounter (Signed)
Pt is aware and samples are at the front desk 

## 2015-05-21 NOTE — Telephone Encounter (Signed)
Pt called today to say that he had seen RMR on 4/28 and the prescription for reflux was not working, but seemed to made it worse. He would like something else called into his pharmacy and for the nurse to call him [phone]

## 2015-05-21 NOTE — Telephone Encounter (Signed)
Ok; stop protonix;  give him 10 days worth of Dexilant 60 mg daily - samples -if this doesn't work, will need to consider other dx possibilities  - discuss possible EGD once again.

## 2015-05-30 ENCOUNTER — Encounter: Payer: Self-pay | Admitting: Internal Medicine

## 2015-06-09 ENCOUNTER — Encounter (HOSPITAL_COMMUNITY): Payer: Self-pay | Admitting: Cardiology

## 2015-06-09 ENCOUNTER — Emergency Department (HOSPITAL_COMMUNITY): Payer: Medicare Other

## 2015-06-09 ENCOUNTER — Inpatient Hospital Stay (HOSPITAL_COMMUNITY): Payer: Medicare Other

## 2015-06-09 DIAGNOSIS — I214 Non-ST elevation (NSTEMI) myocardial infarction: Secondary | ICD-10-CM | POA: Diagnosis present

## 2015-06-09 DIAGNOSIS — J9601 Acute respiratory failure with hypoxia: Secondary | ICD-10-CM | POA: Diagnosis not present

## 2015-06-09 DIAGNOSIS — R269 Unspecified abnormalities of gait and mobility: Secondary | ICD-10-CM | POA: Diagnosis present

## 2015-06-09 DIAGNOSIS — Z888 Allergy status to other drugs, medicaments and biological substances status: Secondary | ICD-10-CM

## 2015-06-09 DIAGNOSIS — R7989 Other specified abnormal findings of blood chemistry: Secondary | ICD-10-CM | POA: Diagnosis not present

## 2015-06-09 DIAGNOSIS — Z7189 Other specified counseling: Secondary | ICD-10-CM | POA: Diagnosis not present

## 2015-06-09 DIAGNOSIS — I4891 Unspecified atrial fibrillation: Secondary | ICD-10-CM | POA: Diagnosis present

## 2015-06-09 DIAGNOSIS — R739 Hyperglycemia, unspecified: Secondary | ICD-10-CM | POA: Diagnosis present

## 2015-06-09 DIAGNOSIS — R509 Fever, unspecified: Secondary | ICD-10-CM | POA: Diagnosis present

## 2015-06-09 DIAGNOSIS — G931 Anoxic brain damage, not elsewhere classified: Secondary | ICD-10-CM | POA: Diagnosis present

## 2015-06-09 DIAGNOSIS — N184 Chronic kidney disease, stage 4 (severe): Secondary | ICD-10-CM

## 2015-06-09 DIAGNOSIS — I120 Hypertensive chronic kidney disease with stage 5 chronic kidney disease or end stage renal disease: Secondary | ICD-10-CM | POA: Diagnosis present

## 2015-06-09 DIAGNOSIS — R092 Respiratory arrest: Secondary | ICD-10-CM | POA: Diagnosis present

## 2015-06-09 DIAGNOSIS — I69398 Other sequelae of cerebral infarction: Secondary | ICD-10-CM

## 2015-06-09 DIAGNOSIS — I272 Other secondary pulmonary hypertension: Secondary | ICD-10-CM | POA: Diagnosis present

## 2015-06-09 DIAGNOSIS — Z66 Do not resuscitate: Secondary | ICD-10-CM | POA: Diagnosis not present

## 2015-06-09 DIAGNOSIS — N185 Chronic kidney disease, stage 5: Secondary | ICD-10-CM | POA: Diagnosis present

## 2015-06-09 DIAGNOSIS — I469 Cardiac arrest, cause unspecified: Secondary | ICD-10-CM | POA: Diagnosis not present

## 2015-06-09 DIAGNOSIS — E872 Acidosis: Secondary | ICD-10-CM | POA: Diagnosis present

## 2015-06-09 DIAGNOSIS — J9600 Acute respiratory failure, unspecified whether with hypoxia or hypercapnia: Secondary | ICD-10-CM

## 2015-06-09 DIAGNOSIS — N189 Chronic kidney disease, unspecified: Secondary | ICD-10-CM | POA: Diagnosis not present

## 2015-06-09 DIAGNOSIS — Z515 Encounter for palliative care: Secondary | ICD-10-CM | POA: Diagnosis not present

## 2015-06-09 DIAGNOSIS — I6782 Cerebral ischemia: Secondary | ICD-10-CM | POA: Diagnosis not present

## 2015-06-09 DIAGNOSIS — I4510 Unspecified right bundle-branch block: Secondary | ICD-10-CM | POA: Diagnosis present

## 2015-06-09 DIAGNOSIS — Z8711 Personal history of peptic ulcer disease: Secondary | ICD-10-CM

## 2015-06-09 DIAGNOSIS — E875 Hyperkalemia: Secondary | ICD-10-CM | POA: Diagnosis not present

## 2015-06-09 DIAGNOSIS — N179 Acute kidney failure, unspecified: Secondary | ICD-10-CM | POA: Diagnosis present

## 2015-06-09 DIAGNOSIS — K219 Gastro-esophageal reflux disease without esophagitis: Secondary | ICD-10-CM | POA: Diagnosis present

## 2015-06-09 DIAGNOSIS — D649 Anemia, unspecified: Secondary | ICD-10-CM | POA: Diagnosis present

## 2015-06-09 DIAGNOSIS — I639 Cerebral infarction, unspecified: Secondary | ICD-10-CM

## 2015-06-09 LAB — URINALYSIS, ROUTINE W REFLEX MICROSCOPIC
Bilirubin Urine: NEGATIVE
Glucose, UA: 100 mg/dL — AB
Ketones, ur: NEGATIVE mg/dL
Nitrite: NEGATIVE
Protein, ur: 30 mg/dL — AB
Specific Gravity, Urine: 1.01 (ref 1.005–1.030)
pH: 5.5 (ref 5.0–8.0)

## 2015-06-09 LAB — BLOOD GAS, ARTERIAL
Acid-base deficit: 17.7 mmol/L — ABNORMAL HIGH (ref 0.0–2.0)
Bicarbonate: 10.9 mEq/L — ABNORMAL LOW (ref 20.0–24.0)
Drawn by: 330991
FIO2: 100
MECHVT: 550 mL
O2 Saturation: 99.5 %
PEEP: 5 cmH2O
Patient temperature: 34.4
RATE: 22 resp/min
pCO2 arterial: 26.8 mmHg — ABNORMAL LOW (ref 35.0–45.0)
pH, Arterial: 7.162 — CL (ref 7.350–7.450)
pO2, Arterial: 470 mmHg — ABNORMAL HIGH (ref 80.0–100.0)

## 2015-06-09 LAB — URINE MICROSCOPIC-ADD ON

## 2015-06-09 LAB — CBC WITH DIFFERENTIAL/PLATELET
Basophils Absolute: 0 10*3/uL (ref 0.0–0.1)
Basophils Relative: 0 %
Eosinophils Absolute: 0 10*3/uL (ref 0.0–0.7)
Eosinophils Relative: 0 %
HCT: 30.4 % — ABNORMAL LOW (ref 39.0–52.0)
Hemoglobin: 9.7 g/dL — ABNORMAL LOW (ref 13.0–17.0)
Lymphocytes Relative: 24 %
Lymphs Abs: 2.4 10*3/uL (ref 0.7–4.0)
MCH: 28.1 pg (ref 26.0–34.0)
MCHC: 31.9 g/dL (ref 30.0–36.0)
MCV: 88.1 fL (ref 78.0–100.0)
Monocytes Absolute: 0.5 10*3/uL (ref 0.1–1.0)
Monocytes Relative: 5 %
Neutro Abs: 7.2 10*3/uL (ref 1.7–7.7)
Neutrophils Relative %: 71 %
Platelets: ADEQUATE 10*3/uL (ref 150–400)
RBC: 3.45 MIL/uL — ABNORMAL LOW (ref 4.22–5.81)
RDW: 15.3 % (ref 11.5–15.5)
WBC: 10.2 10*3/uL (ref 4.0–10.5)

## 2015-06-09 LAB — COMPREHENSIVE METABOLIC PANEL
ALT: 25 U/L (ref 17–63)
AST: 59 U/L — ABNORMAL HIGH (ref 15–41)
Albumin: 3 g/dL — ABNORMAL LOW (ref 3.5–5.0)
Alkaline Phosphatase: 85 U/L (ref 38–126)
Anion gap: 12 (ref 5–15)
BUN: 108 mg/dL — ABNORMAL HIGH (ref 6–20)
CO2: 10 mmol/L — ABNORMAL LOW (ref 22–32)
Calcium: 7.4 mg/dL — ABNORMAL LOW (ref 8.9–10.3)
Chloride: 112 mmol/L — ABNORMAL HIGH (ref 101–111)
Creatinine, Ser: 5 mg/dL — ABNORMAL HIGH (ref 0.61–1.24)
GFR calc Af Amer: 11 mL/min — ABNORMAL LOW (ref >60)
GFR calc non Af Amer: 10 mL/min — ABNORMAL LOW (ref >60)
Glucose, Bld: 193 mg/dL — ABNORMAL HIGH (ref 65–99)
Potassium: 4.6 mmol/L (ref 3.5–5.1)
Sodium: 134 mmol/L — ABNORMAL LOW (ref 135–145)
Total Bilirubin: 0.5 mg/dL (ref 0.3–1.2)
Total Protein: 5.8 g/dL — ABNORMAL LOW (ref 6.5–8.1)

## 2015-06-09 LAB — I-STAT CHEM 8, ED
BUN: 112 mg/dL — ABNORMAL HIGH (ref 6–20)
CHLORIDE: 111 mmol/L (ref 101–111)
CREATININE: 5.1 mg/dL — AB (ref 0.61–1.24)
Calcium, Ion: 1.12 mmol/L — ABNORMAL LOW (ref 1.13–1.30)
Glucose, Bld: 180 mg/dL — ABNORMAL HIGH (ref 65–99)
HEMATOCRIT: 30 % — AB (ref 39.0–52.0)
HEMOGLOBIN: 10.2 g/dL — AB (ref 13.0–17.0)
POTASSIUM: 4.7 mmol/L (ref 3.5–5.1)
Sodium: 138 mmol/L (ref 135–145)
TCO2: 12 mmol/L (ref 0–100)

## 2015-06-09 LAB — I-STAT CG4 LACTIC ACID, ED: LACTIC ACID, VENOUS: 7.67 mmol/L — AB (ref 0.5–2.0)

## 2015-06-09 LAB — GLUCOSE, CAPILLARY: GLUCOSE-CAPILLARY: 114 mg/dL — AB (ref 65–99)

## 2015-06-09 LAB — PROTIME-INR
INR: 1.49 (ref 0.00–1.49)
PROTHROMBIN TIME: 18.1 s — AB (ref 11.6–15.2)

## 2015-06-09 LAB — I-STAT TROPONIN, ED: Troponin i, poc: 0.16 ng/mL (ref 0.00–0.08)

## 2015-06-09 LAB — APTT: aPTT: 36 seconds (ref 24–37)

## 2015-06-09 LAB — TROPONIN I: Troponin I: 19.84 ng/mL (ref <0.031)

## 2015-06-09 LAB — LACTIC ACID, PLASMA: LACTIC ACID, VENOUS: 1.2 mmol/L (ref 0.5–2.0)

## 2015-06-09 LAB — MRSA PCR SCREENING: MRSA BY PCR: POSITIVE — AB

## 2015-06-09 MED ORDER — SODIUM CHLORIDE 0.9 % IV BOLUS (SEPSIS)
1000.0000 mL | Freq: Once | INTRAVENOUS | Status: AC
  Administered 2015-06-09: 1000 mL via INTRAVENOUS

## 2015-06-09 MED ORDER — MIDAZOLAM HCL 2 MG/2ML IJ SOLN
2.0000 mg | Freq: Once | INTRAMUSCULAR | Status: AC
  Administered 2015-06-09: 2 mg via INTRAVENOUS
  Filled 2015-06-09: qty 2

## 2015-06-09 MED ORDER — IPRATROPIUM-ALBUTEROL 0.5-2.5 (3) MG/3ML IN SOLN
RESPIRATORY_TRACT | Status: AC
  Administered 2015-06-09: 3 mL via RESPIRATORY_TRACT
  Filled 2015-06-09: qty 3

## 2015-06-09 MED ORDER — PANTOPRAZOLE SODIUM 40 MG IV SOLR
40.0000 mg | Freq: Every day | INTRAVENOUS | Status: DC
  Administered 2015-06-09 – 2015-06-10 (×2): 40 mg via INTRAVENOUS
  Filled 2015-06-09 (×2): qty 40

## 2015-06-09 MED ORDER — SODIUM CHLORIDE 0.9 % IV SOLN: INTRAVENOUS | Status: DC

## 2015-06-09 MED ORDER — ANTISEPTIC ORAL RINSE SOLUTION (CORINZ)
7.0000 mL | Freq: Four times a day (QID) | OROMUCOSAL | Status: DC
  Administered 2015-06-09 – 2015-06-11 (×5): 7 mL via OROMUCOSAL

## 2015-06-09 MED ORDER — CHLORHEXIDINE GLUCONATE 0.12% ORAL RINSE (MEDLINE KIT)
15.0000 mL | Freq: Two times a day (BID) | OROMUCOSAL | Status: DC
Start: 2015-06-09 — End: 2015-06-11
  Administered 2015-06-09 – 2015-06-11 (×4): 15 mL via OROMUCOSAL

## 2015-06-09 MED ORDER — SODIUM CHLORIDE 0.9 % IV SOLN: 250.0000 mL | INTRAVENOUS | Status: DC | PRN

## 2015-06-09 MED ORDER — IPRATROPIUM-ALBUTEROL 0.5-2.5 (3) MG/3ML IN SOLN
3.0000 mL | Freq: Once | RESPIRATORY_TRACT | Status: AC
  Administered 2015-06-09: 3 mL via RESPIRATORY_TRACT

## 2015-06-09 MED ORDER — VANCOMYCIN HCL IN DEXTROSE 1-5 GM/200ML-% IV SOLN
1000.0000 mg | Freq: Once | INTRAVENOUS | Status: AC
  Administered 2015-06-09: 1000 mg via INTRAVENOUS
  Filled 2015-06-09: qty 200

## 2015-06-09 MED ORDER — ATROPINE SULFATE 1 MG/ML IJ SOLN
INTRAMUSCULAR | Status: AC | PRN
  Administered 2015-06-09 (×2): 1 mg via INTRAVENOUS

## 2015-06-09 MED ORDER — PIPERACILLIN-TAZOBACTAM 3.375 G IVPB 30 MIN
3.3750 g | Freq: Once | INTRAVENOUS | Status: AC
  Administered 2015-06-09: 3.375 g via INTRAVENOUS
  Filled 2015-06-09: qty 50

## 2015-06-09 MED ORDER — MUPIROCIN 2 % EX OINT
1.0000 "application " | TOPICAL_OINTMENT | Freq: Two times a day (BID) | CUTANEOUS | Status: DC
  Administered 2015-06-09 – 2015-06-11 (×4): 1 via NASAL
  Filled 2015-06-09: qty 22

## 2015-06-09 MED ORDER — IPRATROPIUM-ALBUTEROL 0.5-2.5 (3) MG/3ML IN SOLN
3.0000 mL | Freq: Four times a day (QID) | RESPIRATORY_TRACT | Status: DC
  Administered 2015-06-09 – 2015-06-10 (×2): 3 mL via RESPIRATORY_TRACT
  Filled 2015-06-09 (×2): qty 3

## 2015-06-09 MED ORDER — MIDAZOLAM HCL 2 MG/2ML IJ SOLN
5.0000 mg | INTRAMUSCULAR | Status: DC | PRN
  Administered 2015-06-09: 5 mg via INTRAVENOUS
  Filled 2015-06-09: qty 6

## 2015-06-09 MED ORDER — MIDAZOLAM HCL 2 MG/2ML IJ SOLN
INTRAMUSCULAR | Status: AC
  Filled 2015-06-09: qty 2

## 2015-06-09 MED ORDER — MIDAZOLAM HCL 2 MG/2ML IJ SOLN: 2.0000 mg | Freq: Once | INTRAMUSCULAR | Status: DC

## 2015-06-09 MED ORDER — CHLORHEXIDINE GLUCONATE CLOTH 2 % EX PADS
6.0000 | MEDICATED_PAD | Freq: Every day | CUTANEOUS | Status: DC
  Administered 2015-06-10 – 2015-06-11 (×2): 6 via TOPICAL

## 2015-06-09 NOTE — Code Documentation (Signed)
Heart rate in the 30's.  Giving second dose of atrophine.

## 2015-06-09 NOTE — ED Notes (Signed)
Family found patient laying in floor unresponsive.  EMS arrived to house at 1030 and fire department was doing CPR at that time.  Per EMS regained pulses times 2.  Epi given 8 times. CBG 168.  Pt arrived to er pulseless with CPR being given.  ET tube size 6 .  PIV left AC.

## 2015-06-09 NOTE — Progress Notes (Signed)
eLink Physician-Brief Progress Note Patient Name: James BeringBenjamin R Park DOB: 03/10/1929 MRN: [id number]   Date of Service  06/01/2015  HPI/Events of Note  Breathing over vent with some dysynchrony  eICU Interventions  Try versed prn     Intervention Category Minor Interventions: Agitation / anxiety - evaluation and management  Sandrea HughsMichael Marty Sadlowski 05/31/2015, 10:44 PM

## 2015-06-09 NOTE — ED Notes (Signed)
Rhythm check .  Asystole.  CPR being given.

## 2015-06-09 NOTE — Progress Notes (Signed)
Verbal order given by MD for RT and [facility] Paramedic to change out ET tube to larger size. Pediatric bougie was inserted into 5.5cm ET tube, tube removed, and 6.5 ET tube threaded over bougie without difficulty. ETCo2 Positive for color change, BBS equal. ET tube secured at 24cm at the lip, pt placed back on ventilator. RT will continue to monitor.

## 2015-06-09 NOTE — ED Notes (Signed)
Femoral pulse palpated.  Stopped CPR.  Pt back pulseless asystole within seconds.  CPR started back.

## 2015-06-09 NOTE — Progress Notes (Signed)
Pt transported to mobile CT and back to ED2 without incidence.

## 2015-06-09 NOTE — ED Provider Notes (Signed)
CSN: [id number]     Arrival date & time 06/03/2015  1122 History  By signing my name below, I, Renetta Chalk, attest that this documentation has been prepared under the direction and in the presence of Bethann Berkshire, MD. Electronically signed, Renetta Chalk, ED Scribe. 06/01/2015. 11:49 AM.    Chief Complaint  Patient presents with  . Cardiac Arrest     LEVEL 5 CAVEAT DUE TO CARDIAC ARREST  Patient is a 82 y.o. male presenting with general illness. The history is provided by the EMS personnel (EMS states that the patient was trying to put his clothes on and got weakness in his left arm and then filled floor witnessed arrest. Patient was breathing slightly when the firemen arrived. Patient had a epinephrine and appropriate ACLS  for 45 min ). No language interpreter was used.  Illness Severity:  Severe Onset quality:  Sudden Duration: 45 minutes. Timing:  Constant Progression:  Unchanged Chronicity:  New  HPI Comments: James Park is a 82 y.o. male with a PMHx of A-fib, RBB, Pulmonary HTN, CVA, Stage 5 CKD, PUD, presents to the Emergency Department by EMS in cardiac arrest. Per EMS, they were called out for a lifealert call and when the family came in he was laying on the floor unresponsive. EMS arrived to the house at 1030 and fire department was doing active CPR at the time and lost pulse when transferring pt to spine board. Per EMS, he was given Epi 8 times (last dose at 11:16) and regained pulse 4 times after each CPR round.   Past Medical History  Diagnosis Date  . Atrial fibrillation (HCC) 02/2011    Not anticoagulated  . Right bundle branch block   . Essential hypertension, benign   . Anxiety   . Depression   . Constipation   . GERD (gastroesophageal reflux disease)   . Arthritis   . Urinary retention     Self-catheterization when necessary  . Rotator cuff tear     Right - not repaired  . Stroke [facility]) 05/2012    Gait disturbance only residual - hemorrhagic stroke  . CKD  (chronic kidney disease) stage 5, GFR less than 15 ml/min (HCC)     Previously on hemodialysis  . Pulmonary hypertension (HCC)     Severe  . Chronotropic incompetence     Suspected based on GXT  . PUD (peptic ulcer disease)     In his late 65s   Past Surgical History  Procedure Laterality Date  . Knee surgery      Tendon repair  . Eye surgery Bilateral     Cataract  . Insertion of dialysis catheter Right 06/30/2012    Procedure: INSERTION OF DIALYSIS CATHETER right IJ;  Surgeon: Larina Earthly, MD;  Location: [facility] OR;  Service: Vascular;  Laterality: Right;  . Tonsillectomy    . Appendectomy     Family History  Problem Relation Age of Onset  . Parkinson's disease Brother   . Colon cancer Neg Hx    Social History  Substance Use Topics  . Smoking status: Never Smoker   . Smokeless tobacco: None     Comment: quit 1967  . Alcohol Use: 1.2 oz/week    2 Cans of beer per week     Comment: 2 cans of beer daily    Review of Systems  Unable to perform ROS: Intubated    LEVEL 5 CAVEAT DUE TO CARDIAC ARREST  Allergies  Altace  Home Medications  Prior to Admission medications   Medication Sig Start Date End Date Taking? Authorizing Provider  albuterol (PROVENTIL) (2.5 MG/3ML) 0.083% nebulizer solution Take 2.5 mg by nebulization every 6 (six) hours as needed for wheezing or shortness of breath.    Historical Provider, MD  ALPRAZolam Prudy Feeler) 0.25 MG tablet Take 0.25 mg by mouth at bedtime as needed for sleep.     Historical Provider, MD  calcium acetate (PHOSLO) 667 MG capsule Take 667 mg by mouth 3 (three) times daily with meals. Pt said he only takes one tablet in the morning 05/23/13   Historical Provider, MD  doxazosin (CARDURA) 1 MG tablet Take 1 mg by mouth daily.    Historical Provider, MD  Melatonin 5 MG TABS Take 5 mg by mouth at bedtime as needed (Sleep).    Historical Provider, MD  sodium bicarbonate 650 MG tablet Take 650 mg by mouth daily. Reported on 05/11/2015     Historical Provider, MD   Triage Vitals: BP 129/74 mmHg  Pulse 30  Resp 11  SpO2 100% Physical Exam  Constitutional: He appears well-developed.  HENT:  Head: Normocephalic.  Both pupils dilated and unresponsive  Eyes: Conjunctivae and EOM are normal. No scleral icterus.  Neck: Neck supple. No thyromegaly present.  Cardiovascular: Exam reveals no gallop and no friction rub.   No murmur heard. Patient was initially asystolic with CPR being done when he arrived at the ER. When the CPR stopped the patient developed bradycardia  Pulmonary/Chest: No stridor. He has no wheezes. He has no rales.  Patient being bagged  Abdominal: He exhibits no distension. There is no tenderness. There is no rebound.  Musculoskeletal: Normal range of motion. He exhibits no edema.  Lymphadenopathy:    He has no cervical adenopathy.  Neurological: He exhibits normal muscle tone. Coordination normal.  Patient not responding to any verbal or painful stimuli. Patient not moving any extremities.  Skin: No rash noted. No erythema.  Nursing note and vitals reviewed.   ED Course  Procedures DIAGNOSTIC STUDIES: Oxygen Saturation is 100% on BVM, normal by my interpretation.  COORDINATION OF CARE: 11:45 AM Discussed treatment plan which includes with family at bedside and pt agreed to plan.   CRITICAL CARE Performed by: Bethann Berkshire, MD  Total critical care time: 35 minutes  Critical care time was exclusive of separately billable procedures and treating other patients.  Critical care was necessary to treat or prevent imminent or life-threatening deterioration.  Critical care was time spent personally by me on the following activities: development of treatment plan with patient and/or surrogate as well as nursing, discussions with consultants, evaluation of patient's response to treatment, examination of patient, obtaining history from patient or surrogate, ordering and performing treatments and  interventions, ordering and review of laboratory studies, ordering and review of radiographic studies, pulse oximetry and re-evaluation of patient's condition.  Labs Review Labs Reviewed - No data to display  Imaging Review No results found. I have personally reviewed and evaluated these images and lab results as part of my medical decision-making.   EKG Interpretation None      MDM   Final diagnoses:  None    Patient had weakness in his left hand for brief moment and then collapsed on the floor. This was witnessed. His caregiver said that he was breathing slightly when the department arrived. Patient had CPR performed for 45 minutes prior to arriving at the hospital. He begun 8 epinephrines IV. The patient initially was asystolic when he arrived  in the emergency department then became bradycardiac shortly after that.  Patient was given atropine and his pulse came up to 50 afib with a normal blood pressure. Vertical tear was consult and [facility]. I suspect the patient has had a large stroke possible brainstem stroke.   Bethann BerkshireJoseph Tequila Rottmann, MD 05/15/2015 [phone]

## 2015-06-09 NOTE — Code Documentation (Signed)
Sinus rhythm on monitor.  With palpable femoral pulse.

## 2015-06-09 NOTE — ED Notes (Signed)
Inserting temp foley.  Rectal temp 95.1.  Matt HolmesBaer hugger being placed on pt.  Inserting og tube.

## 2015-06-09 NOTE — Consult Note (Signed)
Admission H&P    Chief Complaint: Encephalopathy, post cardiac arrest.  HPI: James Park is an 82 y.o. male with a history of hypertension, atrial fibrillation not on anticoagulation, RBBB, chronic kidney disease and pulmonary hypertension who was transferred from [facility] for further evaluation here. Patient suffered a cardiac arrest and underwent resuscitation for 40-50 minutes. He reportedly had a facial droop and right upper extremity weakness prior to becoming pulseless. Initial troponin was 0.16. Repeat value was 19.84. Patient was given Versed, total of 4 mg, for  involuntary movements not thought to be manifestations of seizure activity. CT scan of his head showed no acute intracranial abnormality. MRI showed areas of ischemia involving the basal ganglia, thalamus and hippocampi, essentially symmetrically, indicative of probable diffuse ischemic etiology.  Past Medical History  Diagnosis Date  . Atrial fibrillation ([place]) 02/2011    Not anticoagulated  . Right bundle branch block   . Essential hypertension, benign   . Anxiety   . Depression   . Constipation   . GERD (gastroesophageal reflux disease)   . Arthritis   . Urinary retention     Self-catheterization when necessary  . Rotator cuff tear     Right - not repaired  . Stroke [facility]) 05/2012    Gait disturbance only residual - hemorrhagic stroke  . CKD (chronic kidney disease) stage 5, GFR less than 15 ml/min (HCC)     Previously on hemodialysis  . Pulmonary hypertension (HCC)     Severe  . Chronotropic incompetence     Suspected based on GXT  . PUD (peptic ulcer disease)     In his late 103s    Past Surgical History  Procedure Laterality Date  . Knee surgery      Tendon repair  . Eye surgery Bilateral     Cataract  . Insertion of dialysis catheter Right 06/30/2012    Procedure: INSERTION OF DIALYSIS CATHETER right IJ;  Surgeon: Rosetta Posner, MD;  Location: [place];  Service: Vascular;  Laterality: Right;  .  Tonsillectomy    . Appendectomy      Family History  Problem Relation Age of Onset  . Parkinson's disease Brother   . Colon cancer Neg Hx    Social History:  reports that he has never smoked. He does not have any smokeless tobacco history on file. He reports that he drinks about 1.2 oz of alcohol per week. He reports that he does not use illicit drugs.  Allergies:  Allergies  Allergen Reactions  . Altace [Ramipril] Hives    Medications Prior to Admission  Medication Sig Dispense Refill  . albuterol (PROVENTIL) (2.5 MG/3ML) 0.083% nebulizer solution Take 2.5 mg by nebulization every 6 (six) hours as needed for wheezing or shortness of breath.    . ALPRAZolam (XANAX) 0.25 MG tablet Take 0.25 mg by mouth at bedtime as needed for sleep.     . calcium acetate (PHOSLO) 667 MG capsule Take 667 mg by mouth 3 (three) times daily with meals. Pt said he only takes one tablet in the morning    . doxazosin (CARDURA) 1 MG tablet Take 1 mg by mouth daily.    Marland Kitchen levofloxacin (LEVAQUIN) 500 MG tablet Take 500 mg by mouth every other day.    . Melatonin 5 MG TABS Take 5 mg by mouth at bedtime as needed (Sleep).    . pantoprazole (PROTONIX) 40 MG tablet Take 40 mg by mouth daily.    . sodium bicarbonate 650 MG  tablet Take 650 mg by mouth daily. Reported on 05/11/2015      ROS: Available, as patient is unconscious.  Physical Examination: Blood pressure 182/76, pulse 48, temperature 99 F (37.2 C), temperature source Oral, resp. rate 28, height _0  (1.778 m), weight 88.4 kg (194 lb 14.2 oz), SpO2 100 %.  HEENT-  Normocephalic, no lesions, without obvious abnormality.  Normal external eye and conjunctiva.  Normal TM's bilaterally.  Normal auditory canals and external ears. Normal external nose, mucus membranes and septum.  Normal pharynx. Neck supple with no masses, nodes, nodules or enlargement. Cardiovascular - bradycardic in the 50s, regular rhythm, normal S1 and S2 Lungs - chest clear, no  wheezing, rales, normal symmetric air entry Abdomen - soft, non-tender; bowel sounds normal; no masses,  no organomegaly Extremities - no joint deformities, effusion, or inflammation and no edema  Neurologic Examination: Patient was intubated on mechanical ventilation. No spontaneous respirations were noted. He had no response to noxious stimuli. Pupils were equal and moderately dilated. There was no reaction to light on either side. Extraocular movements are absent with oculocephalic maneuvers. Face was symmetrical. Muscle tone was flaccid throughout. No abnormal posturing and no spontaneous movements of extremities. Deep tendon reflexes were 2+ and symmetrical. Plantar responses are mute.  Results for orders placed or performed during the hospital encounter of 05/28/2015 (from the past 48 hour(s))  CBC with Differential/Platelet     Status: Abnormal   Collection Time: 06/13/2015 11:42 AM  Result Value Ref Range   WBC 10.2 4.0 - 10.5 K/uL   RBC 3.45 (L) 4.22 - 5.81 MIL/uL   Hemoglobin 9.7 (L) 13.0 - 17.0 g/dL   HCT 30.4 (L) 39.0 - 52.0 %   MCV 88.1 78.0 - 100.0 fL   MCH 28.1 26.0 - 34.0 pg   MCHC 31.9 30.0 - 36.0 g/dL   RDW 15.3 11.5 - 15.5 %   Platelets  150 - 400 K/uL    PLATELET CLUMPS NOTED ON SMEAR, COUNT APPEARS ADEQUATE   Neutrophils Relative % 71 %   Neutro Abs 7.2 1.7 - 7.7 K/uL   Lymphocytes Relative 24 %   Lymphs Abs 2.4 0.7 - 4.0 K/uL   Monocytes Relative 5 %   Monocytes Absolute 0.5 0.1 - 1.0 K/uL   Eosinophils Relative 0 %   Eosinophils Absolute 0.0 0.0 - 0.7 K/uL   Basophils Relative 0 %   Basophils Absolute 0.0 0.0 - 0.1 K/uL  Comprehensive metabolic panel     Status: Abnormal   Collection Time: 05/24/2015 11:42 AM  Result Value Ref Range   Sodium 134 (L) 135 - 145 mmol/L   Potassium 4.6 3.5 - 5.1 mmol/L   Chloride 112 (H) 101 - 111 mmol/L   CO2 10 (L) 22 - 32 mmol/L   Glucose, Bld 193 (H) 65 - 99 mg/dL   BUN 108 (H) 6 - 20 mg/dL    Comment: RESULTS CONFIRMED  BY MANUAL DILUTION   Creatinine, Ser 5.00 (H) 0.61 - 1.24 mg/dL   Calcium 7.4 (L) 8.9 - 10.3 mg/dL   Total Protein 5.8 (L) 6.5 - 8.1 g/dL   Albumin 3.0 (L) 3.5 - 5.0 g/dL   AST 59 (H) 15 - 41 U/L   ALT 25 17 - 63 U/L   Alkaline Phosphatase 85 38 - 126 U/L   Total Bilirubin 0.5 0.3 - 1.2 mg/dL   GFR calc non Af Amer 10 (L) >60 mL/min   GFR calc Af Amer 11 (L) >60 mL/min  Comment: (NOTE) The eGFR has been calculated using the CKD EPI equation. This calculation has not been validated in all clinical situations. eGFR's persistently <60 mL/min signify possible Chronic Kidney Disease.    Anion gap 12 5 - 15  I-Stat CG4 Lactic Acid, ED     Status: Abnormal   Collection Time: 05/29/2015 11:47 AM  Result Value Ref Range   Lactic Acid, Venous 7.67 (HH) 0.5 - 2.0 mmol/L   Comment NOTIFIED PHYSICIAN   I-stat chem 8, ed     Status: Abnormal   Collection Time: 05/29/2015 11:48 AM  Result Value Ref Range   Sodium 138 135 - 145 mmol/L   Potassium 4.7 3.5 - 5.1 mmol/L   Chloride 111 101 - 111 mmol/L   BUN 112 (H) 6 - 20 mg/dL   Creatinine, Ser 5.10 (H) 0.61 - 1.24 mg/dL   Glucose, Bld 180 (H) 65 - 99 mg/dL   Calcium, Ion 1.12 (L) 1.13 - 1.30 mmol/L   TCO2 12 0 - 100 mmol/L   Hemoglobin 10.2 (L) 13.0 - 17.0 g/dL   HCT 30.0 (L) 39.0 - 52.0 %  I-stat troponin, ED     Status: Abnormal   Collection Time: 05/29/2015 11:49 AM  Result Value Ref Range   Troponin i, poc 0.16 (HH) 0.00 - 0.08 ng/mL   Comment NOTIFIED PHYSICIAN    Comment 3            Comment: Due to the release kinetics of cTnI, a negative result within the first hours of the onset of symptoms does not rule out myocardial infarction with certainty. If myocardial infarction is still suspected, repeat the test at appropriate intervals.   Urinalysis, Routine w reflex microscopic (not at [facility])     Status: Abnormal   Collection Time: 06/10/2015 12:16 PM  Result Value Ref Range   Color, Urine YELLOW YELLOW   APPearance HAZY (A) CLEAR    Specific Gravity, Urine 1.010 1.005 - 1.030   pH 5.5 5.0 - 8.0   Glucose, UA 100 (A) NEGATIVE mg/dL   Hgb urine dipstick MODERATE (A) NEGATIVE   Bilirubin Urine NEGATIVE NEGATIVE   Ketones, ur NEGATIVE NEGATIVE mg/dL   Protein, ur 30 (A) NEGATIVE mg/dL   Nitrite NEGATIVE NEGATIVE   Leukocytes, UA TRACE (A) NEGATIVE  Urine microscopic-add on     Status: Abnormal   Collection Time: 05/25/2015 12:16 PM  Result Value Ref Range   Squamous Epithelial / LPF 0-5 (A) NONE SEEN   WBC, UA 0-5 0 - 5 WBC/hpf   RBC / HPF 6-30 0 - 5 RBC/hpf   Bacteria, UA RARE (A) NONE SEEN  Blood Culture (routine x 2)     Status: None (Preliminary result)   Collection Time: 05/28/2015 12:17 PM  Result Value Ref Range   Specimen Description RIGHT ANTECUBITAL    Special Requests BOTTLES DRAWN AEROBIC AND ANAEROBIC 6CC EACH    Culture PENDING    Report Status PENDING   Blood Culture (routine x 2)     Status: None (Preliminary result)   Collection Time: 06/07/2015 12:25 PM  Result Value Ref Range   Specimen Description BLOOD RIGHT HAND    Special Requests BOTTLES DRAWN AEROBIC AND ANAEROBIC [facility] EACH    Culture PENDING    Report Status PENDING   Blood gas, arterial     Status: Abnormal   Collection Time: 05/31/2015  1:55 PM  Result Value Ref Range   FIO2 100.00    Delivery systems VENTILATOR  Mode PRESSURE REGULATED VOLUME CONTROL    VT 550 mL   LHR 22 resp/min   Peep/cpap 5.0 cm H20   pH, Arterial 7.162 (LL) 7.350 - 7.450    Comment: RBV JJ CRUISE RN AT 3875 BY CARRIE MIKE, RRT RCP ON 05/25/2015.   pCO2 arterial 26.8 (L) 35.0 - 45.0 mmHg   pO2, Arterial 470 (H) 80.0 - 100.0 mmHg   Bicarbonate 10.9 (L) 20.0 - 24.0 mEq/L   Acid-base deficit 17.7 (H) 0.0 - 2.0 mmol/L   O2 Saturation 99.5 %   Patient temperature 34.4    Collection site RIGHT RADIAL    Drawn by [phone]    Sample type ARTERIAL DRAW    Allens test (pass/fail) PASS PASS  Glucose, capillary     Status: Abnormal   Collection Time: 05/16/2015  5:51 PM   Result Value Ref Range   Glucose-Capillary 114 (H) 65 - 99 mg/dL  MRSA PCR Screening     Status: Abnormal   Collection Time: 05/27/2015  5:59 PM  Result Value Ref Range   MRSA by PCR POSITIVE (A) NEGATIVE    Comment:        The GeneXpert MRSA Assay (FDA approved for NASAL specimens only), is one component of a comprehensive MRSA colonization surveillance program. It is not intended to diagnose MRSA infection nor to guide or monitor treatment for MRSA infections. RESULT CALLED TO, READ BACK BY AND VERIFIED WITH: RN KEATON,A AT 2012 [id number] MARTINB   Protime-INR     Status: Abnormal   Collection Time: 05/26/2015  8:00 PM  Result Value Ref Range   Prothrombin Time 18.1 (H) 11.6 - 15.2 seconds   INR 1.49 0.00 - 1.49  APTT     Status: None   Collection Time: 05/16/2015  8:00 PM  Result Value Ref Range   aPTT 36 24 - 37 seconds  Troponin I (q 6hr x 3)     Status: Abnormal   Collection Time: 05/19/2015  8:00 PM  Result Value Ref Range   Troponin I 19.84 (HH) <0.031 ng/mL    Comment:        POSSIBLE MYOCARDIAL ISCHEMIA. SERIAL TESTING RECOMMENDED. CRITICAL RESULT CALLED TO, READ BACK BY AND VERIFIED WITH: DALLAS T,RN 06/06/2015 2055 WAYK   Lactic acid, plasma     Status: None   Collection Time: 05/20/2015  8:00 PM  Result Value Ref Range   Lactic Acid, Venous 1.2 0.5 - 2.0 mmol/L   Ct Head Wo Contrast  05/25/2015  CLINICAL DATA:  Family found patient laying in floor unresponsive. EMS arrived to house at 1030 and fire department was doing CPR at that time. , hx of stroke/bbj EXAM: CT HEAD WITHOUT CONTRAST TECHNIQUE: Contiguous axial images were obtained from the base of the skull through the vertex without intravenous contrast. COMPARISON:  06/11/2012 FINDINGS: Brain: Moderate diffuse atrophy. No evidence of acute infarction, hemorrhage, extra-axial collection, ventriculomegaly, or mass effect. Vascular: No hyperdense vessel or unexpected calcification. Atherosclerotic and physiologic  intracranial calcifications. Skull: Negative for fracture or focal lesion. Sinuses/Orbits: No acute findings. Other: Endotracheal tube is partially seen. IMPRESSION: 1. Atrophy without bleed or other acute intracranial process. Electronically Signed   By: Lucrezia Europe M.D.   On: 05/16/2015 13:10   Mr Brain Wo Contrast  06/12/2015  CLINICAL DATA:  82 year old hypertensive male with chronic kidney disease presenting with unresponsiveness. Post CPR. Subsequent encounter. EXAM: MRI HEAD WITHOUT CONTRAST TECHNIQUE: Multiplanar, multiecho pulse sequences of the brain and surrounding structures were obtained without  intravenous contrast. COMPARISON:  05/18/2015 CT.  06/10/2012 MR. FINDINGS: Fairly symmetric restricted motion throughout the caudate bilaterally, thalamus bilaterally and involving the hippocampus bilaterally as well as posterior aspect of the lenticular nucleus greater on the right. Small areas of restricted motion posterior left operculum region. In the present clinical setting of prolonged CPR, findings most likely represent result of hypoxia. Similar findings can be seen in the setting of Creutzfeldt-Jacob disease and hypoglycemia/hyperglycemia. Global atrophy without hydrocephalus. No intracranial hemorrhage. No intracranial mass lesion noted on this unenhanced exam. Major intracranial vascular structures are patent. Paranasal sinus mucosal thickening partial opacification with air-fluid levels maxillary sinuses, ethmoid sinus air cells and sphenoid sinuses suggestive of acute sinusitis. Post lens replacement.  Mild exophthalmos. Mild spinal stenosis C3-4. IMPRESSION: Fairly symmetric restricted motion throughout the caudate bilaterally, thalamus bilaterally and involving the hippocampus bilaterally as well as posterior aspect of the lenticular nucleus greater on the right. Small areas of restricted motion posterior left operculum region. In the present clinical setting of prolonged CPR, findings most  likely represent result of hypoxia. Similar findings can be seen in the setting of Creutzfeldt-Jacob disease and hypoglycemia/hyperglycemia. Acute infectious process felt less likely consideration. Global atrophy without hydrocephalus. Paranasal sinus mucosal thickening partial opacification with air-fluid levels maxillary sinuses, ethmoid sinus air cells and sphenoid sinuses suggestive of acute sinusitis. Post lens replacement. Mild exophthalmos. Mild spinal stenosis C3-4. Electronically Signed   By: Genia Del M.D.   On: 05/19/2015 21:29   Dg Chest Port 1 View  05/24/2015  CLINICAL DATA:  ET tube placement.  Respiratory failure. EXAM: PORTABLE CHEST 1 VIEW COMPARISON:  Chest radiograph 06/02/2015. FINDINGS: ET tube terminates in the mid trachea. Monitoring leads overlie the patient. Stable enlarged cardiac and mediastinal contours. Minimal heterogeneous opacities left lung base. No pleural effusion or pneumothorax. Multiple old right rib fractures. Bilateral shoulder joint degenerative changes. IMPRESSION: ET tube terminates in the mid trachea. Heterogeneous opacities left lung base may represent atelectasis or infection. Electronically Signed   By: Lovey Newcomer M.D.   On: 05/19/2015 20:23   Dg Chest Portable 1 View  06/05/2015  CLINICAL DATA:  Intubated. Weakness. Atrial fibrillation. End-stage renal disease. EXAM: PORTABLE CHEST 1 VIEW COMPARISON:  11/02/2014. FINDINGS: Enlarged cardiac silhouette without significant change. Interval endotracheal tube in satisfactory position. Clear lungs. The right lateral lung base is not included. Old, healed right rib fractures. IMPRESSION: 1. No acute abnormality. 2. Stable cardiomegaly. Electronically Signed   By: Claudie Revering M.D.   On: 05/16/2015 12:17    Assessment/Plan 82 year old man with acute fevers hypoxic encephalopathy, likely severe. Prognosis cannot be determined at this point, but is guarded, at best. There was a marked rise in troponin, likely  indicating acute myocardial event.  Recommendations: 1. EEG, routine adult study 2. CT scan in 48 hours to rule out diffuse ischemic changes, including infarctions and/or cerebral edema 3. Supportive, aggressive care to the extent of family's wishes  We will continue to follow this patient with you.  C.R. Nicole Kindred, MD [facility]  06/03/2015, 10:22 PM

## 2015-06-09 NOTE — H&P (Signed)
PULMONARY / CRITICAL CARE MEDICINE   Name: James Park MRN: [id number] DOB: 06/14/1929    ADMISSION DATE:  06/10/2015 CONSULTATION DATE:  06/07/2015  REFERRING MD:  Dr. Estell HarpinZammit   CHIEF COMPLAINT:  Respiratory Arrest  HISTORY OF PRESENT ILLNESS:  82 y/o M with PMH of HTN, AF (not on AC), RBB, anxiety / depression, GERD, constipation, arthritis, CKD Stage 5, CVA (2014) & pulmonary hypertension who presented to [facility] on 5/27 unresponsive.    The patient;s caregiver apparently activated life alert on am of admit after he became limp while trying to get dressed.  There was no bystander CPR until Fire arrival.  There was question of the patients left side going limp before the event.  On arrival, EMS found him to be unresponsive with the Fire Dept doing active CPR.  They lost a pulse when transferring to the spine board. The patient received 8 rounds of epinephrine.  He regained pulses 4 times after rounds of CPR.  Estimated time of CPR approximately 40-50 minutes.  On arrival to ER, notes reflect his pupils were dilated / non-reactive and he was hypothermic.  Initial labs - na 134, K 4.6, Cl 112, Sr 5.0, glucose 193, albumin 3.0, AST 59, ALT 25, troponin 0.16, lactic acid 7.67, WBC 10.2, Hgb 9.7, platelets clumped.  ABG 7.162 / 26 / 470 / 10.  Initial CXR showed no acute abnormality, cardiomegaly. EKG showed atrial fibrillation.  CT of the head was negative for acute process.   [facility] evaluation.     PAST MEDICAL HISTORY :  Past Medical History  Diagnosis Date  . Atrial fibrillation (HCC) 02/2011    Not anticoagulated  . Right bundle branch block   . Essential hypertension, benign   . Anxiety   . Depression   . Constipation   . GERD (gastroesophageal reflux disease)   . Arthritis   . Urinary retention     Self-catheterization when necessary  . Rotator cuff tear     Right - not repaired  . Stroke [facility]HCC) 05/2012    Gait disturbance only residual  - hemorrhagic stroke  . CKD (chronic kidney disease) stage 5, GFR less than 15 ml/min (HCC)     Previously on hemodialysis  . Pulmonary hypertension (HCC)     Severe  . Chronotropic incompetence     Suspected based on GXT  . PUD (peptic ulcer disease)     In his late 3350s     PAST SURGICAL HISTORY: Past Surgical History  Procedure Laterality Date  . Knee surgery      Tendon repair  . Eye surgery Bilateral     Cataract  . Insertion of dialysis catheter Right 06/30/2012    Procedure: INSERTION OF DIALYSIS CATHETER right IJ;  Surgeon: Larina Earthlyodd F Early, MD;  [facility];  Service: Vascular;  Laterality: Right;  . Tonsillectomy    . Appendectomy       Allergies  Allergen Reactions  . Altace [Ramipril] Hives    No current facility-administered medications on file prior to encounter.   Current Outpatient Prescriptions on File Prior to Encounter  Medication Sig  . albuterol (PROVENTIL) (2.5 MG/3ML) 0.083% nebulizer solution Take 2.5 mg by nebulization every 6 (six) hours as needed for wheezing or shortness of breath.  . ALPRAZolam (XANAX) 0.25 MG tablet Take 0.25 mg by mouth at bedtime as needed for sleep.   . calcium acetate (PHOSLO) 667 MG capsule Take 667 mg by mouth  3 (three) times daily with meals. Pt said he only takes one tablet in the morning  . doxazosin (CARDURA) 1 MG tablet Take 1 mg by mouth daily.  . Melatonin 5 MG TABS Take 5 mg by mouth at bedtime as needed (Sleep).  . sodium bicarbonate 650 MG tablet Take 650 mg by mouth daily. Reported on 05/11/2015    FAMILY HISTORY:  Family History  Problem Relation Age of Onset  . Parkinson's disease Brother   . Colon cancer Neg Hx     SOCIAL HISTORY: Social History  Substance Use Topics  . Smoking status: Never Smoker   . Smokeless tobacco: None     Comment: quit 1967  . Alcohol Use: 1.2 oz/week    2 Cans of beer per week     Comment: 2 cans of beer daily    REVIEW OF SYSTEMS:   Unable to complete as patient is  altered on mechanical ventilation.   SUBJECTIVE:   VITAL SIGNS: BP 124/67 mmHg  Pulse 25  Temp(Src) 93.7 F (34.3 C)  Resp 12  SpO2 100%  HEMODYNAMICS:    VENTILATOR SETTINGS: Vent Mode:  [-] PRVC FiO2 (%):  [100 %] 100 % Set Rate:  [22 bmp] 22 bmp Vt Set:  [550 mL] 550 mL PEEP:  [5 cmH20] 5 cmH20 Plateau Pressure:  [22 cmH20] 22 cmH20  INTAKE / OUTPUT:    PHYSICAL EXAMINATION: General:  No acute distress. Family at bedside.  Integument:  Warm & dry. No rash on exposed skin.  Lymphatics:  No appreciated cervical or supraclavicular lymphadenoapthy. HEENT:  Moist mucus membranes. No scleral icterus. Endotracheal tube in place. Upper gaze. Cardiovascular:  Regular rate. No edema. No appreciable JVD.  Pulmonary:  Clear bilaterally to auscultation. Symmetric chest wall rise on ventilator. Abdomen: Soft. Normal bowel sounds. Nondistended.  Musculoskeletal:  Normal bulk. No joint deformity or effusion appreciated. Neurological:  Symmetric brachioradialis & patellar deep tendon reflexes. Questionable withdrawal to pain left lower extremity versus pathologic reflex but otherwise no withdrawal to pain in other extremities. Cough reflex is present. No gag or corneal reflexes. Psychiatric:  Unable to assess given altered mentation.  LABS:  BMET  Recent Labs Lab 05/25/2015 1142 06/05/2015 1148  NA 134* 138  K 4.6 4.7  CL 112* 111  CO2 10*  --   BUN 108* 112*  CREATININE 5.00* 5.10*  GLUCOSE 193* 180*    Electrolytes  Recent Labs Lab 06/01/2015 1142  CALCIUM 7.4*    CBC  Recent Labs Lab 05/30/2015 1142 06/01/2015 1148  WBC 10.2  --   HGB 9.7* 10.2*  HCT 30.4* 30.0*  PLT PLATELET CLUMPS NOTED ON SMEAR, COUNT APPEARS ADEQUATE  --     Coag's No results for input(s): APTT, INR in the last 168 hours.  Sepsis Markers  Recent Labs Lab 05/19/2015 1147  LATICACIDVEN 7.67*    ABG  Recent Labs Lab 06/05/2015 1355  PHART 7.162*  PCO2ART 26.8*  PO2ART 470*     Liver Enzymes  Recent Labs Lab 05/28/2015 1142  AST 59*  ALT 25  ALKPHOS 85  BILITOT 0.5  ALBUMIN 3.0*    Cardiac Enzymes No results for input(s): TROPONINI, PROBNP in the last 168 hours.  Glucose No results for input(s): GLUCAP in the last 168 hours.  Imaging Ct Head Wo Contrast  06/06/2015  CLINICAL DATA:  Family found patient laying in floor unresponsive. EMS arrived to house at 1030 and fire department was doing CPR at that time. , hx of  stroke/bbj EXAM: CT HEAD WITHOUT CONTRAST TECHNIQUE: Contiguous axial images were obtained from the base of the skull through the vertex without intravenous contrast. COMPARISON:  06/11/2012 FINDINGS: Brain: Moderate diffuse atrophy. No evidence of acute infarction, hemorrhage, extra-axial collection, ventriculomegaly, or mass effect. Vascular: No hyperdense vessel or unexpected calcification. Atherosclerotic and physiologic intracranial calcifications. Skull: Negative for fracture or focal lesion. Sinuses/Orbits: No acute findings. Other: Endotracheal tube is partially seen. IMPRESSION: 1. Atrophy without bleed or other acute intracranial process. Electronically Signed   By: Corlis Leak M.D.   On: 06/01/2015 13:10   Dg Chest Portable 1 View  06/04/2015  CLINICAL DATA:  Intubated. Weakness. Atrial fibrillation. End-stage renal disease. EXAM: PORTABLE CHEST 1 VIEW COMPARISON:  11/02/2014. FINDINGS: Enlarged cardiac silhouette without significant change. Interval endotracheal tube in satisfactory position. Clear lungs. The right lateral lung base is not included. Old, healed right rib fractures. IMPRESSION: 1. No acute abnormality. 2. Stable cardiomegaly. Electronically Signed   By: Beckie Salts M.D.   On: 05/18/2015 12:17     STUDIES:  CT Head 5/27 >> no acute abnormality   MICROBIOLOGY: BCx2 5/27 >>  UC 5/27 >>   ANTIBIOTICS: Vancomycin 5/27 >> Zosyn 5/27 >>  SIGNIFICANT EVENTS: 5/27  Admit after prolonged cardiac arrest (50 min), ? Of  left sided weakness prior to event  LINES/TUBES: ETT 5/27 >>  Foley 5/27 >> PIV x2  DISCUSSION: 82 y/o M admitted after collapsing at home, ? Of left sided weakness prior to event, prolonged cardiac arrest (>50 min).  Checking MRI of the brain and obtaining neurology consultation given high probability for stroke is either precipitating or secondary event. Explained to the patient's family that this prolonged downtime carries with it a poor prognosis for recovery.  ASSESSMENT / PLAN:  NEUROLOGIC A:   Probable CVA Acute Encephalopathy - post arrest   P:   RASS goal: 0 Serial neuro exams q2hr Minimize sedation as able  MRI Stat w/o Neurology Consult  CARDIOVASCULAR A:  Cardiac Arrest - prolonged downtime, > 50 min Hx HTN Hx RBBB Hx Atrial Fibrillation - not on anticoagulation   P:  ICU monitoring  Patient is not a cooling candidate due to prolonged arrest Trending Troponin I Checking TTE  PULMONARY A: Acute Hypoxic Respiratory Failure - in setting of arrest, ? Neurological event Pulmonary Hypertension  P:   Full Vent Support Wean PEEP / FiO2 for sats > 92% Intermittent CXR  Follow up ABG  RENAL A:   Lactic Acidosis  CKD Stage 5 - previously on HD, reportedly refuses any further  P:   Trending electrolytes & renal function Monitoring UOP  Replace electrolytes as indicated  Trend lactate  GASTROINTESTINAL A:   Hx GERD   P:   PROTONIX for SUP  NPO   HEMATOLOGIC A:   Anemia - No signs of active bleeding.  P:  Trending cell counts daily w/ CBC  SCD's for DVT prophylaxis  Holding on chemical DVT prophylaxis   INFECTIOUS A:   Recent URI   P:   Cultures as above  Holding on further antibiotics  ENDOCRINE A:   Hyperglycemia - No h/o DM.    P:   Monitor glucose on daily BMP   FAMILY  - Updates: Daughter updated at bedside by Dr. Jamison Neighbor.  - Inter-disciplinary family meet or Palliative Care meeting due by:  6/3  I have spent a total of  37 minutes of critical care time today caring for the patient, discussing the plan of  care with his daughter at bedside, and reviewing the patient's electronic medical record.  Donna Christen Jamison Neighbor, M.D. [facility] Pulmonary & Critical Care Pager:  [phone] After 3pm or if no response, call [phone] 7:52 PM 05/22/2015

## 2015-06-09 NOTE — Code Documentation (Signed)
Pt palpable pulse in the 60's.  Respirations being given by ventilator

## 2015-06-09 NOTE — Progress Notes (Signed)
Pt placed on 100% FiO2 to preoxygenate prior to ET tube exchange.

## 2015-06-09 NOTE — ED Notes (Signed)
Update of initial note.  Per daughter ,  Patient had fell this morning and after caregiver was helping pt get up .  Pt went limp and was not breathing.  When Fire department arrived pt did not have a pulse and CPR was started then.

## 2015-06-09 NOTE — Progress Notes (Signed)
eLink Physician-Brief Progress Note Patient Name: Fabio BeringBenjamin R Nilan DOB: 03/11/1929 MRN: [id number]   Date of Service  05/16/2015  HPI/Events of Note  [facility] codes on Vent p et changed over a wand prior to transfer  eICU Interventions  Repeat cxr/ abg's/ vent orders written     Intervention Category Major Interventions: Respiratory failure - evaluation and management  Sandrea HughsMichael Owyn Raulston 05/19/2015, 6:43 PM

## 2015-06-09 NOTE — Progress Notes (Signed)
CRITICAL VALUE ALERT  Critical value received:  Troponin 19.84, MRSA PCR +  Date of notification:  05/25/2015  Time of notification:  2055  Critical value read back:Yes.    Nurse who received alert:  Tvedt RN  MD notified (1st page):  Wert MD  Time of first page:  2130 (was in MRI)  MD notified (2nd page):  Time of second page:  Responding MD:  Sherene SiresWert MD  Time MD responded:  2130

## 2015-06-09 NOTE — Progress Notes (Signed)
eLink Physician-Brief Progress Note Patient Name: James BeringBenjamin R Park DOB: 12/07/1929 MRN: [id number]   Date of Service  05/26/2015  HPI/Events of Note  Fm requesting his duonebs be resumed  eICU Interventions  duoneb qid ordered     Intervention Category Major Interventions: Respiratory failure - evaluation and management  Sandrea HughsMichael Dejour Vos 05/14/2015, 10:30 PM

## 2015-06-09 NOTE — ED Notes (Signed)
Daughter at bedside. Pt remains unresponsive.

## 2015-06-10 DIAGNOSIS — R7989 Other specified abnormal findings of blood chemistry: Secondary | ICD-10-CM

## 2015-06-10 DIAGNOSIS — E872 Acidosis: Secondary | ICD-10-CM

## 2015-06-10 DIAGNOSIS — N185 Chronic kidney disease, stage 5: Secondary | ICD-10-CM

## 2015-06-10 DIAGNOSIS — R092 Respiratory arrest: Secondary | ICD-10-CM

## 2015-06-10 LAB — RENAL FUNCTION PANEL
ANION GAP: 16 — AB (ref 5–15)
Albumin: 3.2 g/dL — ABNORMAL LOW (ref 3.5–5.0)
BUN: 119 mg/dL — ABNORMAL HIGH (ref 6–20)
CHLORIDE: 111 mmol/L (ref 101–111)
CO2: 9 mmol/L — ABNORMAL LOW (ref 22–32)
Calcium: 7.8 mg/dL — ABNORMAL LOW (ref 8.9–10.3)
Creatinine, Ser: 5.38 mg/dL — ABNORMAL HIGH (ref 0.61–1.24)
GFR, EST AFRICAN AMERICAN: 10 mL/min — AB (ref >60)
GFR, EST NON AFRICAN AMERICAN: 9 mL/min — AB (ref >60)
Glucose, Bld: 99 mg/dL (ref 65–99)
PHOSPHORUS: 5.5 mg/dL — AB (ref 2.5–4.6)
POTASSIUM: 5.2 mmol/L — AB (ref 3.5–5.1)
Sodium: 136 mmol/L (ref 135–145)

## 2015-06-10 LAB — BLOOD GAS, ARTERIAL
ACID-BASE DEFICIT: 17.1 mmol/L — AB (ref 0.0–2.0)
BICARBONATE: 9.4 meq/L — AB (ref 20.0–24.0)
Drawn by: 398991
FIO2: 0.4
O2 SAT: 95.8 %
PEEP/CPAP: 5 cmH2O
PH ART: 7.202 — AB (ref 7.350–7.450)
Patient temperature: 98.6
RATE: 22 resp/min
TCO2: 10.2 mmol/L (ref 0–100)
VT: 550 mL
pCO2 arterial: 24.9 mmHg — ABNORMAL LOW (ref 35.0–45.0)
pO2, Arterial: 94.2 mmHg (ref 80.0–100.0)

## 2015-06-10 LAB — CBC WITH DIFFERENTIAL/PLATELET
BASOS PCT: 0 %
Basophils Absolute: 0 10*3/uL (ref 0.0–0.1)
Eosinophils Absolute: 0 10*3/uL (ref 0.0–0.7)
Eosinophils Relative: 0 %
HEMATOCRIT: 30.5 % — AB (ref 39.0–52.0)
Hemoglobin: 9.6 g/dL — ABNORMAL LOW (ref 13.0–17.0)
LYMPHS PCT: 7 %
Lymphs Abs: 1.3 10*3/uL (ref 0.7–4.0)
MCH: 26.7 pg (ref 26.0–34.0)
MCHC: 31.5 g/dL (ref 30.0–36.0)
MCV: 85 fL (ref 78.0–100.0)
MONO ABS: 0.9 10*3/uL (ref 0.1–1.0)
MONOS PCT: 5 %
NEUTROS ABS: 17.5 10*3/uL — AB (ref 1.7–7.7)
Neutrophils Relative %: 89 %
Platelets: 127 10*3/uL — ABNORMAL LOW (ref 150–400)
RBC: 3.59 MIL/uL — ABNORMAL LOW (ref 4.22–5.81)
RDW: 15.2 % (ref 11.5–15.5)
WBC: 19.8 10*3/uL — ABNORMAL HIGH (ref 4.0–10.5)

## 2015-06-10 LAB — LACTIC ACID, PLASMA
Lactic Acid, Venous: 1.3 mmol/L (ref 0.5–2.0)
Lactic Acid, Venous: 1.3 mmol/L (ref 0.5–2.0)

## 2015-06-10 LAB — TROPONIN I
Troponin I: 23.03 ng/mL (ref <0.031)
Troponin I: 21.35 ng/mL (ref <0.031)

## 2015-06-10 LAB — MAGNESIUM: Magnesium: 2 mg/dL (ref 1.7–2.4)

## 2015-06-10 MED ORDER — IPRATROPIUM-ALBUTEROL 0.5-2.5 (3) MG/3ML IN SOLN
3.0000 mL | Freq: Four times a day (QID) | RESPIRATORY_TRACT | Status: DC
Start: 2015-06-10 — End: 2015-06-11
  Administered 2015-06-10 – 2015-06-11 (×4): 3 mL via RESPIRATORY_TRACT
  Filled 2015-06-10 (×4): qty 3

## 2015-06-10 MED ORDER — HEPARIN SODIUM (PORCINE) 5000 UNIT/ML IJ SOLN
5000.0000 [IU] | Freq: Three times a day (TID) | INTRAMUSCULAR | Status: DC
  Administered 2015-06-10 – 2015-06-11 (×3): 5000 [IU] via SUBCUTANEOUS
  Filled 2015-06-10 (×3): qty 1

## 2015-06-10 MED ORDER — ACETAMINOPHEN 650 MG RE SUPP: 650.0000 mg | Freq: Four times a day (QID) | RECTAL | Status: DC | PRN

## 2015-06-10 MED ORDER — PROPOFOL 1000 MG/100ML IV EMUL
5.0000 ug/kg/min | INTRAVENOUS | Status: DC
  Administered 2015-06-11: 5 ug/kg/min via INTRAVENOUS
  Filled 2015-06-10: qty 100

## 2015-06-10 NOTE — Progress Notes (Signed)
PULMONARY / CRITICAL CARE MEDICINE   Name: James Park MRN: [id number] DOB: 10-Oct-1929    ADMISSION DATE:  05/16/2015 CONSULTATION DATE:  05/29/2015  REFERRING MD:  Dr. Estell Harpin   CHIEF COMPLAINT:  Respiratory Arrest  HISTORY OF PRESENT ILLNESS:  82 y/o M with PMH of HTN, AF (not on AC), RBB, anxiety / depression, GERD, constipation, arthritis, CKD Stage 5, CVA (2014) & pulmonary hypertension who presented to [facility] on 5/27 unresponsive.    The patient;s caregiver apparently activated life alert on am of admit after he became limp while trying to get dressed.  There was no bystander CPR until Fire arrival.  There was question of the patients left side going limp before the event.  On arrival, EMS found him to be unresponsive with the Fire Dept doing active CPR.  They lost a pulse when transferring to the spine board. The patient received 8 rounds of epinephrine.  He regained pulses 4 times after rounds of CPR.  Estimated time of CPR approximately 40-50 minutes.  On arrival to ER, notes reflect his pupils were dilated / non-reactive and he was hypothermic.  Initial labs - na 134, K 4.6, Cl 112, Sr 5.0, glucose 193, albumin 3.0, AST 59, ALT 25, troponin 0.16, lactic acid 7.67, WBC 10.2, Hgb 9.7, platelets clumped.  ABG 7.162 / 26 / 470 / 10.  Initial CXR showed no acute abnormality, cardiomegaly. EKG showed atrial fibrillation.  CT of the head was negative for acute process.   SUBJECTIVE: No acute events overnight. Given Versed for ventilator synchrony.  REVIEW OF SYSTEMS:   Unable to complete as patient is altered on mechanical ventilation.   VITAL SIGNS: BP 174/73 mmHg  Pulse 60  Temp(Src) 99.1 F (37.3 C) (Oral)  Resp 26  Ht 5\' 10"  (1.778 m)  Wt 203 lb 0.7 oz (92.1 kg)  BMI 29.13 kg/m2  SpO2 100%  HEMODYNAMICS:    VENTILATOR SETTINGS: Vent Mode:  [-] PRVC FiO2 (%):  [40 %-100 %] 40 % Set Rate:  [22 bmp] 22 bmp Vt Set:  [550 mL] 550 mL PEEP:  [5 cmH20] 5  cmH20 Plateau Pressure:  [22 cmH20-28 cmH20] 28 cmH20  INTAKE / OUTPUT: I/O last 3 completed shifts: In: 120 [I.V.:120] Out: 380 [Urine:380]  PHYSICAL EXAMINATION: General:  No acute distress. No family at bedside.  Integument:  Warm & dry. No rash on exposed skin.  Lymphatics:  No appreciated cervical or supraclavicular lymphadenoapthy. HEENT:  Moist mucus membranes. No scleral icterus. Endotracheal tube in place. Upward gaze. Cardiovascular:  Regular rate. No edema. No appreciable JVD.  Pulmonary:  Coarse expiratory wheeze. Symmetric chest wall rise on ventilator. Abdomen: Soft. Normal bowel sounds. Nondistended.  Neurological:  Corneal reflexes now present along with cough. Withdrawal to pain in all extremities. Pupils symmetric & reactive. Bilateral Babinski reflex.   LABS:  BMET  Recent Labs Lab 06/08/2015 1142 06/07/2015 1148 06/10/15 0213  NA 134* 138 136  K 4.6 4.7 5.2*  CL 112* 111 111  CO2 10*  --  9*  BUN 108* 112* 119*  CREATININE 5.00* 5.10* 5.38*  GLUCOSE 193* 180* 99    Electrolytes  Recent Labs Lab 06/02/2015 1142 06/10/15 0213  CALCIUM 7.4* 7.8*  MG  --  2.0  PHOS  --  5.5*    CBC  Recent Labs Lab 05/24/2015 1142 05/30/2015 1148 06/10/15 0213  WBC 10.2  --  19.8*  HGB 9.7* 10.2* 9.6*  HCT 30.4* 30.0* 30.5*  PLT PLATELET  CLUMPS NOTED ON SMEAR, COUNT APPEARS ADEQUATE  --  127*    Coag's  Recent Labs Lab 07/01/15 2000  APTT 36  INR 1.49    Sepsis Markers  Recent Labs Lab 2015-07-01 1147 Jul 01, 2015 2000 06/10/15 0213  LATICACIDVEN 7.67* 1.2 1.3    ABG  Recent Labs Lab 07-01-2015 1355  PHART 7.162*  PCO2ART 26.8*  PO2ART 470*    Liver Enzymes  Recent Labs Lab Jul 01, 2015 1142 06/10/15 0213  AST 59*  --   ALT 25  --   ALKPHOS 85  --   BILITOT 0.5  --   ALBUMIN 3.0* 3.2*    Cardiac Enzymes  Recent Labs Lab Jul 01, 2015 2000 06/10/15 0213  TROPONINI 19.84* 23.03*    Glucose  Recent Labs Lab July 01, 2015 1751  GLUCAP  114*    Imaging Ct Head Wo Contrast  July 01, 2015  CLINICAL DATA:  Family found patient laying in floor unresponsive. EMS arrived to house at 1030 and fire department was doing CPR at that time. , hx of stroke/bbj EXAM: CT HEAD WITHOUT CONTRAST TECHNIQUE: Contiguous axial images were obtained from the base of the skull through the vertex without intravenous contrast. COMPARISON:  06/11/2012 FINDINGS: Brain: Moderate diffuse atrophy. No evidence of acute infarction, hemorrhage, extra-axial collection, ventriculomegaly, or mass effect. Vascular: No hyperdense vessel or unexpected calcification. Atherosclerotic and physiologic intracranial calcifications. Skull: Negative for fracture or focal lesion. Sinuses/Orbits: No acute findings. Other: Endotracheal tube is partially seen. IMPRESSION: 1. Atrophy without bleed or other acute intracranial process. Electronically Signed   By: Corlis Leak M.D.   On: 07/01/2015 13:10   Mr Brain Wo Contrast  07/01/2015  CLINICAL DATA:  82 year old hypertensive male with chronic kidney disease presenting with unresponsiveness. Post CPR. Subsequent encounter. EXAM: MRI HEAD WITHOUT CONTRAST TECHNIQUE: Multiplanar, multiecho pulse sequences of the brain and surrounding structures were obtained without intravenous contrast. COMPARISON:  2015/07/01 CT.  06/10/2012 MR. FINDINGS: Fairly symmetric restricted motion throughout the caudate bilaterally, thalamus bilaterally and involving the hippocampus bilaterally as well as posterior aspect of the lenticular nucleus greater on the right. Small areas of restricted motion posterior left operculum region. In the present clinical setting of prolonged CPR, findings most likely represent result of hypoxia. Similar findings can be seen in the setting of Creutzfeldt-Jacob disease and hypoglycemia/hyperglycemia. Global atrophy without hydrocephalus. No intracranial hemorrhage. No intracranial mass lesion noted on this unenhanced exam. Major  intracranial vascular structures are patent. Paranasal sinus mucosal thickening partial opacification with air-fluid levels maxillary sinuses, ethmoid sinus air cells and sphenoid sinuses suggestive of acute sinusitis. Post lens replacement.  Mild exophthalmos. Mild spinal stenosis C3-4. IMPRESSION: Fairly symmetric restricted motion throughout the caudate bilaterally, thalamus bilaterally and involving the hippocampus bilaterally as well as posterior aspect of the lenticular nucleus greater on the right. Small areas of restricted motion posterior left operculum region. In the present clinical setting of prolonged CPR, findings most likely represent result of hypoxia. Similar findings can be seen in the setting of Creutzfeldt-Jacob disease and hypoglycemia/hyperglycemia. Acute infectious process felt less likely consideration. Global atrophy without hydrocephalus. Paranasal sinus mucosal thickening partial opacification with air-fluid levels maxillary sinuses, ethmoid sinus air cells and sphenoid sinuses suggestive of acute sinusitis. Post lens replacement. Mild exophthalmos. Mild spinal stenosis C3-4. Electronically Signed   By: Lacy Duverney M.D.   On: 07/01/2015 21:29   Dg Chest Port 1 View  July 01, 2015  CLINICAL DATA:  ET tube placement.  Respiratory failure. EXAM: PORTABLE CHEST 1 VIEW COMPARISON:  Chest radiograph 07-01-2015.  FINDINGS: ET tube terminates in the mid trachea. Monitoring leads overlie the patient. Stable enlarged cardiac and mediastinal contours. Minimal heterogeneous opacities left lung base. No pleural effusion or pneumothorax. Multiple old right rib fractures. Bilateral shoulder joint degenerative changes. IMPRESSION: ET tube terminates in the mid trachea. Heterogeneous opacities left lung base may represent atelectasis or infection. Electronically Signed   By: Annia Beltrew  Davis M.D.   On: 05/28/2015 20:23   Dg Chest Portable 1 View  05/25/2015  CLINICAL DATA:  Intubated. Weakness. Atrial  fibrillation. End-stage renal disease. EXAM: PORTABLE CHEST 1 VIEW COMPARISON:  11/02/2014. FINDINGS: Enlarged cardiac silhouette without significant change. Interval endotracheal tube in satisfactory position. Clear lungs. The right lateral lung base is not included. Old, healed right rib fractures. IMPRESSION: 1. No acute abnormality. 2. Stable cardiomegaly. Electronically Signed   By: Beckie SaltsSteven  Reid M.D.   On: 05/27/2015 12:17     STUDIES:  CT Head 5/27: no acute abnormality  Port CXR 5/27:  ETT in good position.  MRI w/o 5/27:  Restricted by motion. Findings suspicious for results of hypoxia. Global atrophy.   MICROBIOLOGY: BCx2 5/27 >>  UC 5/27 >>  MRSA PCR 5/27:  Positive  ANTIBIOTICS: Vancomycin 5/27  Zosyn 5/27   SIGNIFICANT EVENTS: 5/27  Admit after prolonged cardiac arrest (50 min), ? Of left sided weakness prior to event  LINES/TUBES: ETT 5/27 >>  Foley 5/27 >> PIV x2  ASSESSMENT / PLAN:  NEUROLOGIC A:   Probable Anoxic Brain Injury Acute Encephalopathy - post arrest   P:   Neurology Following RASS goal: 0 Serial neuro exams q2hr Minimize sedation as able  EEG pending  CARDIOVASCULAR A:  Cardiac Arrest - prolonged downtime, > 50 min Elevated Troponin I - Suspect cardiac event. Hx HTN Hx RBBB Hx Atrial Fibrillation - not on anticoagulation   P:  ICU monitoring  Patient is not a cooling candidate due to prolonged arrest TTE pending Permissive hypertension   PULMONARY A: Acute Hypoxic Respiratory Failure - in setting of arrest, ? Neurological event Pulmonary Hypertension  P:   Full Vent Support Wean PEEP / FiO2 for sats > 92% Holding on SBT Duoneb q6hr  RENAL A:   Metabolic Acidosis Hyperkalemia - Mild.  Lactic Acidosis - Resolved. CKD Stage 5 - previously on HD, reportedly refuses any further  P:   Trending electrolytes & renal function Monitoring UOP  Replace electrolytes as indicated  Holding on Bicarb gtt for  now  GASTROINTESTINAL A:   Hx GERD   P:   PROTONIX for SUP  NPO   HEMATOLOGIC A:   Anemia - No signs of active bleeding.  P:  Trending cell counts daily w/ CBC  SCD's for DVT prophylaxis  Heparin  q8hr  INFECTIOUS A:   Recent URI   P:   Cultures as above  Holding on further antibiotics  ENDOCRINE A:   Hyperglycemia - No h/o DM.    P:   Monitor glucose on daily electrolyte panel  FAMILY  - Updates: Daughter updated at bedside by Dr. Jamison NeighborNestor 5/27. No family at present.  - Inter-disciplinary family meet or Palliative Care meeting due by:  6/3  TODAY'S SUMMARY:  82 y/o M admitted after collapsing at home, ? Of left sided weakness prior to event, prolonged cardiac arrest (>50 min).  Awaiting TTE for elevated troponin I. Guarded prognosis. Appreciate Neurology recommendations.   I have spent a total of 33 minutes of critical care time today caring for the patient, discussing the plan  of care with his daughter at bedside, and reviewing the patient's electronic medical record.  Donna Christen Jamison Neighbor, M.D. [facility] Pulmonary & Critical Care Pager:  [phone] After 3pm or if no response, call [phone] 7:16 AM 06/10/2015

## 2015-06-10 NOTE — Progress Notes (Signed)
eLink Physician-Brief Progress Note Patient Name: Fabio BeringBenjamin R Finck DOB: 09/09/1929 MRN: [id number]   Date of Service  06/10/2015  HPI/Events of Note  Fm requesting prn tylenol for fever  eICU Interventions  tyl sup prn     Intervention Category Minor Interventions: Routine modifications to care plan (e.g. PRN medications for pain, fever)  Sandrea HughsMichael Taleya Whitcher 06/10/2015, 4:50 PM

## 2015-06-10 NOTE — Progress Notes (Signed)
Subjective: MRI shows some hypoxic injury  Exam: Filed Vitals:   06/10/15 1300 06/10/15 1400  BP: 174/64 174/59  Pulse: 64 65  Temp:    Resp: 25 25   Gen: In bed, intubated Resp: Ventilated Abd: soft, nt  Neuro: MS: Opens eyes partially to deep noxious stimulus, does not follow commands CN: Pupils equal round and reactive, corneals intact Motor: Extensor posturing to noxious stimuli in all 4 strategies Sensory: As above  Pertinent Labs: Elevated creatinine  Impression: 82 year old male with encephalopathy status post cardiac arrest. I suspect that he has suffered a fairly significant hypoxic injury, but I think that it is too early to make a definite prognostication. MRI cervical concerning, but I don't think that alone is enough to demonstrate definite poor prognosis.  Recommendations: 1) would wait for 72 hours postarrest for reexamination. 2) EEG, routine study  Ritta SlotMcNeill Kirkpatrick, MD [facility] [phone]  If 7pm- 7am, please page neurology on call as listed in AMION.

## 2015-06-10 NOTE — Progress Notes (Signed)
PCCM Attending Family Discussion Note I had a lengthy discussion with the patient's daughters at bedside regarding his prognosis. They have requested spiritual support from hospital chaplain as well as consultation of hospice/palliative care services. Ultimately they would like to transition the patient to home if possible before passing but recognizes this may not be able to be achieved. We also discussed the patient's end-of-life wishes and toes status. Both are in agreement with a DO NOT RESUSCITATE status/Limited code. We will continue with current investigational plan while awaiting results of EEG, transthoracic echocardiogram, and potential repeat head CT imaging.  James ChristenJennings E. Jamison Park, M.D. [facility] Pager:  [phone] After 3pm or if no response, call [phone] 5:44 PM 06/10/2015

## 2015-06-10 NOTE — Progress Notes (Signed)
Elink MD-Wert made aware of pt's BP 190s-200s/70s, states to monitor for now since pt had stroke symptoms preceding multiple codes. Also, pt not producing urine at this time. MD aware. Will continue to monitor pt closely. Family members at  Bedside.  Pt unresponsive at this time.

## 2015-06-11 ENCOUNTER — Inpatient Hospital Stay (HOSPITAL_COMMUNITY): Payer: Medicare Other

## 2015-06-11 DIAGNOSIS — N179 Acute kidney failure, unspecified: Secondary | ICD-10-CM

## 2015-06-11 DIAGNOSIS — I6782 Cerebral ischemia: Secondary | ICD-10-CM

## 2015-06-11 DIAGNOSIS — N189 Chronic kidney disease, unspecified: Secondary | ICD-10-CM

## 2015-06-11 DIAGNOSIS — Z7189 Other specified counseling: Secondary | ICD-10-CM

## 2015-06-11 DIAGNOSIS — Z515 Encounter for palliative care: Secondary | ICD-10-CM

## 2015-06-11 LAB — CBC WITH DIFFERENTIAL/PLATELET
BASOS PCT: 0 %
Basophils Absolute: 0 10*3/uL (ref 0.0–0.1)
EOS ABS: 0 10*3/uL (ref 0.0–0.7)
EOS PCT: 0 %
HCT: 32.2 % — ABNORMAL LOW (ref 39.0–52.0)
HEMOGLOBIN: 10.3 g/dL — AB (ref 13.0–17.0)
Lymphocytes Relative: 5 %
Lymphs Abs: 1.2 10*3/uL (ref 0.7–4.0)
MCH: 27.5 pg (ref 26.0–34.0)
MCHC: 32 g/dL (ref 30.0–36.0)
MCV: 86.1 fL (ref 78.0–100.0)
MONOS PCT: 5 %
Monocytes Absolute: 1.2 10*3/uL — ABNORMAL HIGH (ref 0.1–1.0)
NEUTROS PCT: 90 %
Neutro Abs: 22.1 10*3/uL — ABNORMAL HIGH (ref 1.7–7.7)
PLATELETS: 107 10*3/uL — AB (ref 150–400)
RBC: 3.74 MIL/uL — ABNORMAL LOW (ref 4.22–5.81)
RDW: 16.1 % — AB (ref 11.5–15.5)
WBC: 24.6 10*3/uL — ABNORMAL HIGH (ref 4.0–10.5)

## 2015-06-11 LAB — RENAL FUNCTION PANEL
Albumin: 3.2 g/dL — ABNORMAL LOW (ref 3.5–5.0)
Anion gap: 15 (ref 5–15)
BUN: 137 mg/dL — AB (ref 6–20)
CALCIUM: 8.2 mg/dL — AB (ref 8.9–10.3)
CHLORIDE: 113 mmol/L — AB (ref 101–111)
CO2: 9 mmol/L — AB (ref 22–32)
CREATININE: 7.04 mg/dL — AB (ref 0.61–1.24)
GFR calc non Af Amer: 6 mL/min — ABNORMAL LOW (ref >60)
GFR, EST AFRICAN AMERICAN: 7 mL/min — AB (ref >60)
GLUCOSE: 123 mg/dL — AB (ref 65–99)
Phosphorus: 7.9 mg/dL — ABNORMAL HIGH (ref 2.5–4.6)
Potassium: 5.9 mmol/L — ABNORMAL HIGH (ref 3.5–5.1)
SODIUM: 137 mmol/L (ref 135–145)

## 2015-06-11 LAB — MAGNESIUM: MAGNESIUM: 2.2 mg/dL (ref 1.7–2.4)

## 2015-06-11 LAB — URINE CULTURE

## 2015-06-11 LAB — TRIGLYCERIDES: Triglycerides: 103 mg/dL (ref <150)

## 2015-06-11 LAB — GLUCOSE, CAPILLARY: Glucose-Capillary: 134 mg/dL — ABNORMAL HIGH (ref 65–99)

## 2015-06-11 MED ORDER — ATROPINE SULFATE 1 % OP SOLN
1.0000 [drp] | Freq: Four times a day (QID) | OPHTHALMIC | Status: DC | PRN
  Administered 2015-06-11: 2 [drp] via SUBLINGUAL
  Filled 2015-06-11: qty 2

## 2015-06-11 MED ORDER — IPRATROPIUM-ALBUTEROL 0.5-2.5 (3) MG/3ML IN SOLN
3.0000 mL | Freq: Four times a day (QID) | RESPIRATORY_TRACT | Status: DC
  Administered 2015-06-11: 3 mL via RESPIRATORY_TRACT
  Filled 2015-06-11: qty 3

## 2015-06-11 MED ORDER — MORPHINE BOLUS VIA INFUSION
5.0000 mg | INTRAVENOUS | Status: DC | PRN
  Administered 2015-06-11 (×2): 5 mg via INTRAVENOUS
  Filled 2015-06-11 (×3): qty 20

## 2015-06-11 MED ORDER — MORPHINE SULFATE 25 MG/ML IV SOLN
10.0000 mg/h | INTRAVENOUS | Status: DC
  Administered 2015-06-11: 5 mg/h via INTRAVENOUS
  Filled 2015-06-11: qty 10

## 2015-06-11 MED ORDER — SCOPOLAMINE 1 MG/3DAYS TD PT72
1.0000 | MEDICATED_PATCH | TRANSDERMAL | Status: DC
  Administered 2015-06-11: 1.5 mg via TRANSDERMAL
  Filled 2015-06-11: qty 1

## 2015-06-11 MED ORDER — MORPHINE SULFATE (PF) 2 MG/ML IV SOLN
1.0000 mg | INTRAVENOUS | Status: DC | PRN
  Administered 2015-06-11: 1 mg via INTRAVENOUS
  Administered 2015-06-11 (×2): 2 mg via INTRAVENOUS
  Filled 2015-06-11 (×3): qty 1

## 2015-06-12 MED FILL — Medication: Qty: 1 | Status: AC

## 2015-06-13 ENCOUNTER — Telehealth: Payer: Self-pay

## 2015-06-13 NOTE — Telephone Encounter (Signed)
On 06/13/2015 I received a death [facility] (original). The death certificate is for cremation. The patient is a patient of Designer, television/film setDoctor Nestor. The [id number] this am for signature.  On 06/24/2015 I received the death certificate back from [place]. I got the death certificate ready and called the funeral home to them know I [facility] the [facility] their [facility]. I also faxed a copy over to the funeral home per their request.

## 2015-06-14 LAB — CULTURE, BLOOD (ROUTINE X 2)
CULTURE: NO GROWTH
Culture: NO GROWTH

## 2015-06-14 NOTE — Progress Notes (Signed)
Patient extubated per "withdrawal of life" protocol.  No complications noted.

## 2015-06-14 NOTE — Progress Notes (Signed)
PULMONARY / CRITICAL CARE MEDICINE   Name: James BeringBenjamin R Gail MRN: [id number] DOB: 05/17/1929    ADMISSION DATE:  05/30/2015 CONSULTATION DATE:  05/22/2015  REFERRING MD:  Dr. Estell HarpinZammit   CHIEF COMPLAINT:  Respiratory Arrest  HISTORY OF PRESENT ILLNESS:  82 y/o M with PMH of HTN, AF (not on AC), RBB, anxiety / depression, GERD, constipation, arthritis, CKD Stage 5, CVA (2014) & pulmonary hypertension who [facility] on 5/27 unresponsive.    The patient;s caregiver apparently activated life alert on am of admit after he became limp while trying to get dressed.  There was no bystander CPR until Fire arrival.  There was question of the patients left side going limp before the event.  On arrival, EMS found him to be unresponsive with the Fire Dept doing active CPR.  They lost a pulse when transferring to the spine board. The patient received 8 rounds of epinephrine.  He regained pulses 4 times after rounds of CPR.  Estimated time of CPR approximately 40-50 minutes.  On arrival to ER, notes reflect his pupils were dilated / non-reactive and he was hypothermic.  Initial labs - na 134, K 4.6, Cl 112, Sr 5.0, glucose 193, albumin 3.0, AST 59, ALT 25, troponin 0.16, lactic acid 7.67, WBC 10.2, Hgb 9.7, platelets clumped.  ABG 7.162 / 26 / 470 / 10.  Initial CXR showed no acute abnormality, cardiomegaly. EKG showed atrial fibrillation.  CT of the head was negative for acute process.   SUBJECTIVE: No acute events overnight. Patient transitioned on a low-dose propofol infusion for ventilator synchrony. Family wishing to proceed with full comfort measures today.  REVIEW OF SYSTEMS:   Unable to complete as patient is altered on mechanical ventilation.   VITAL SIGNS: BP 168/63 mmHg  Pulse 66  Temp(Src) 98 F (36.7 C) (Oral)  Resp 29  Ht 5\' 10"  (1.778 m)  Wt 217 lb 6 oz (98.6 kg)  BMI 31.19 kg/m2  SpO2 100%  HEMODYNAMICS:    VENTILATOR SETTINGS: Vent Mode:  [-] PRVC FiO2 (%):  [40 %]  40 % Set Rate:  [22 bmp] 22 bmp Vt Set:  [550 mL] 550 mL PEEP:  [5 cmH20] 5 cmH20 Plateau Pressure:  [18 cmH20] 18 cmH20  INTAKE / OUTPUT: I/O last 3 completed shifts: In: 360 [I.V.:360] Out: 675 [Urine:675]  PHYSICAL EXAMINATION: General:  No acute distress. Daughters at bedside.  HEENT:  Endotracheal tube in place.  Neurological:  No spontaneous movements.    LABS:  BMET  Recent Labs Lab 06/02/2015 1142 06/05/2015 1148 06/10/15 0213 05/15/2015 0631  NA 134* 138 136 137  K 4.6 4.7 5.2* 5.9*  CL 112* 111 111 113*  CO2 10*  --  9* 9*  BUN 108* 112* 119* 137*  CREATININE 5.00* 5.10* 5.38* 7.04*  GLUCOSE 193* 180* 99 123*    Electrolytes  Recent Labs Lab 05/28/2015 1142 06/10/15 0213 05/18/2015 0631  CALCIUM 7.4* 7.8* 8.2*  MG  --  2.0 2.2  PHOS  --  5.5* 7.9*    CBC  Recent Labs Lab 05/29/2015 1142 06/12/2015 1148 06/10/15 0213 05/27/2015 0330  WBC 10.2  --  19.8* 24.6*  HGB 9.7* 10.2* 9.6* 10.3*  HCT 30.4* 30.0* 30.5* 32.2*  PLT PLATELET CLUMPS NOTED ON SMEAR, COUNT APPEARS ADEQUATE  --  127* 107*    Coag's  Recent Labs Lab 06/01/2015 2000  APTT 36  INR 1.49    Sepsis Markers  Recent Labs Lab 06/07/2015 2000 06/10/15  1610 06/10/15 0739  LATICACIDVEN 1.2 1.3 1.3    ABG  Recent Labs Lab 05/16/2015 1355 06/10/15 0800  PHART 7.162* 7.202*  PCO2ART 26.8* 24.9*  PO2ART 470* 94.2    Liver Enzymes  Recent Labs Lab 05/24/2015 1142 06/10/15 0213 05/29/2015 0631  AST 59*  --   --   ALT 25  --   --   ALKPHOS 85  --   --   BILITOT 0.5  --   --   ALBUMIN 3.0* 3.2* 3.2*    Cardiac Enzymes  Recent Labs Lab 06/10/2015 2000 06/10/15 0213 06/10/15 0739  TROPONINI 19.84* 23.03* 21.35*    Glucose  Recent Labs Lab Jun 11, 2015 1751 06/07/2015 0824  GLUCAP 114* 134*    Imaging No results found.   STUDIES:  CT Head 5/27: no acute abnormality  Port CXR 5/27:  ETT in good position.  MRI w/o 5/27:  Restricted by motion. Findings suspicious for  results of hypoxia. Global atrophy.   MICROBIOLOGY: BCx2 5/27 >>  UC 5/27 >>  MRSA PCR 5/27:  Positive  ANTIBIOTICS: Vancomycin 5/27  Zosyn 5/27   SIGNIFICANT EVENTS: 5/27  Admit after prolonged cardiac arrest (50 min), ? Of left sided weakness prior to event  LINES/TUBES: ETT 5/27 >>  Foley 5/27 >> PIV x2  ASSESSMENT / PLAN:  82 year old male status post cardiac arrest at home and strokelike symptoms. Lengthy discussion with palliative medicine team, hospital chaplain, neurology, and myself with the patient's daughters today. At this time given his lack of clinical improvement family wish to proceed with full withdrawal of care. Hospital chaplain was able to pray with the patient's family at bedside. Utilizing atropine sublingual drops for oral secretions. Morphine infusion if needed but utilizing IV boluses intermittently.  I have spent a total of 31 minutes of critical care time today caring for the patient, discussing the plan of care with his daughters at bedside, and reviewing the patient's electronic medical record.  Donna Christen Jamison Neighbor, M.D. [facility] Critical [facility] Pager:  [phone] After 3pm or if no response, call [phone] 12:20 PM 05/21/2015

## 2015-06-14 NOTE — Discharge Summary (Signed)
DEATH NOTE: For complete accounting of the patient's history and physical exam on presentation please refer to the H&P on 05/17/2015. Patient was an 82 year old male with known history of chronic kidney disease stage V, pulmonary hypertension, atrial fibrillation not on anticoagulation, and a stroke in 2014. Patient suffered an out of hospital cardiac arrest and stroke like symptoms prior to transfer to our facility on 06/01/2015. The patient did have a prolonged course of CPR and resuscitation. He was transferred to our facility for further evaluation and workup. Patient showed minimal neurological improvement approximately 48 hours after arrest. With worsening acute on chronic renal failure and after evaluation by neurology including an MRI suggestive of hypoxic brain injury and EEG. Family decided to transition to full comfort measures with terminal extubation. They requested a palliative medicine consultation and Dr. Neale BurlyFreeman facilitated this. Hospital chaplain was available for spiritual support. The patient was terminally extubated on 01/23/2015. Per documentation at 1600 p.m. on 01/23/2015 the patient became asystolic with family at bedside and succumbed to his illness.  DIAGNOSES AT DEATH: 1. Acute hypoxic respiratory failure 2. Probable anoxic brain injury 3. Metabolic acidosis 4. Acute on chronic renal failure 5. Hyperglycemia 6. Non-ST elevation myocardial infarction 7. History of atrial fibrillation not on anticoagulation 8. History of stroke 2014 9. History of hypertension 10. History of pulmonary hypertension

## 2015-06-14 NOTE — Progress Notes (Signed)
   July 05, 2015 1300  Clinical Encounter Type  Visited With Patient and family together;Family  Visit Type Patient actively dying;Critical Care;Initial  Referral From Nurse  Spiritual Encounters  Spiritual Needs Emotional;Grief support;Prayer  Ch requested to offer spiritual care and support for daughters as they met with Palliative Care Team on [place] and decision made for terminal extubation and comfort care; [place] offered grief, emotional and spiritual support as well as prayer for family; Goodman available to offer additional support as needed.

## 2015-06-14 NOTE — Progress Notes (Signed)
225cc of morphine gtt wasted in sink by this RN and witnessed by Noah CharonPenny Shelton RN. Lorin PicketLindsey Hanaa Payes, RN

## 2015-06-14 NOTE — Consult Note (Signed)
Consultation Note Date: July 04, 2015   Patient Name: James Park  DOB: 05-05-1929  MRN: [id number]  Age / Sex: 82 y.o., male  PCP: Chesley Noon, MD Referring Physician: Javier Glazier, MD  Reason for Consultation: Establishing goals of care, Hospice Evaluation and Withdrawal of life-sustaining treatment  HPI/Patient Profile: 82 y.o. male  with past medical history of Hypertension, atrial fibrillation, right bundle branch block, anxiety, GERD, constipation, arthritis, CAD stage V (on HD for less than 3 months 3 years ago) and pulmonary hypertension admitted on 05/29/2015 following cardiac arrest at home. He had multiple rounds of CPR and is currently intubated in ICU with no plans for escalation of care in the event of another cardiac arrest. Palliative consulted for goals of care.  Clinical Assessment and Goals of Care: Met today with [name] 2 daughters. [name] from chaplaincy, Dr. Ashok Cordia, and his bedside nursing staff were also in attendance.  His daughters report he has been independent man who is always been clear with his wishes not to have aggressive measures done to prolong his life. 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  His daughters report his physicians have been doing a good job talking about things with them and they understand the critical nature of his illness. They are also very clear today that if their father were to understand his current clinical situation his wishes would be to withdraw any life sustaining measures and focus strictly on comfort care. Main objective from family is to see if there is possibility to have him transition home with the support of hospice for end-of-life care.  SUMMARY OF RECOMMENDATIONS   - Plan for withdrawal of ventilator support today. Family wishes to proceed with full  comfort measures. Comfort meds per critical care. - If he is stable enough to consider transition home with hospice support, family would like to do this as soon as possible. I placed a referral to care management to make them aware of the situation. - If he lives through the night, I will ask someone from our team to follow-up tomorrow morning to help establish if he appears stable enough to consider transitioning home. If so, his daughter James Park is a Marine scientist who has worked for hospice and will be able to care for him at home. She expressed preference for hospice of [facility] if he is able to be discharged with hospice support.  Code Status/Advance Care Planning:  DNR   Symptom Management:   Discussed with Dr. Ashok Cordia. He will place orders for withdrawal of ventilator support and comfort care medications.  Palliative Prophylaxis:   Frequent Pain Assessment  Additional Recommendations (Limitations, Scope, Preferences):  Full Comfort Care  Psycho-social/Spiritual:   Desire for further Chaplaincy support: Already involved  Additional Recommendations: Education on Hospice  Prognosis:   Hours - Days  Discharge Planning: Anticipated Hospital Death versus home with hospice support if he survives the night  and is stable enough to consider transitioning home. I do think that this most likely will be a terminal admission.     Primary Diagnoses: Present on Admission:  . Respiratory arrest ([place]) . Cardiac arrest [facility])  I have reviewed the medical record, interviewed the patient and family, and examined the patient. The following aspects are pertinent.  Past Medical History  Diagnosis Date  . Atrial fibrillation ([place]) 02/2011    Not anticoagulated  . Right bundle branch block   . Essential hypertension, benign   . Anxiety   . Depression   . Constipation   . GERD (gastroesophageal reflux disease)   . Arthritis   . Urinary retention     Self-catheterization when  necessary  . Rotator cuff tear     Right - not repaired  . Stroke [facility]) 05/2012    Gait disturbance only residual - hemorrhagic stroke  . CKD (chronic kidney disease) stage 5, GFR less than 15 ml/min (HCC)     Previously on hemodialysis  . Pulmonary hypertension (HCC)     Severe  . Chronotropic incompetence     Suspected based on GXT  . PUD (peptic ulcer disease)     In his late 34s   Social History   Social History  . Marital Status: Divorced    Spouse Name: N/A  . Number of Children: 2  . Years of Education: N/A   Social History Main Topics  . Smoking status: Never Smoker   . Smokeless tobacco: None     Comment: quit 1967  . Alcohol Use: 1.2 oz/week    2 Cans of beer per week     Comment: 2 cans of beer daily  . Drug Use: No  . Sexual Activity: Not Asked   Other Topics Concern  . None   Social History Narrative   Family History  Problem Relation Age of Onset  . Parkinson's disease Brother   . Colon cancer Neg Hx    Scheduled Meds:  Continuous Infusions: . sodium chloride 10 mL/hr at 06/08/2015 1830  . morphine     PRN Meds:.atropine, morphine injection, morphine Medications Prior to Admission:  Prior to Admission medications   Medication Sig Start Date End Date Taking? Authorizing Provider  albuterol (PROVENTIL) (2.5 MG/3ML) 0.083% nebulizer solution Take 2.5 mg by nebulization every 6 (six) hours as needed for wheezing or shortness of breath.   Yes Historical Provider, MD  ALPRAZolam Duanne Moron) 0.25 MG tablet Take 0.25 mg by mouth at bedtime as needed for sleep.    Yes Historical Provider, MD  calcium acetate (PHOSLO) 667 MG capsule Take 667 mg by mouth 3 (three) times daily with meals. Pt said he only takes one tablet in the morning 05/23/13  Yes Historical Provider, MD  doxazosin (CARDURA) 1 MG tablet Take 1 mg by mouth daily.   Yes Historical Provider, MD  levofloxacin (LEVAQUIN) 500 MG tablet Take 500 mg by mouth every other day.   Yes Historical Provider, MD    Melatonin 5 MG TABS Take 5 mg by mouth at bedtime as needed (Sleep).   Yes Historical Provider, MD  pantoprazole (PROTONIX) 40 MG tablet Take 40 mg by mouth daily.   Yes Historical Provider, MD  sodium bicarbonate 650 MG tablet Take 650 mg by mouth daily. Reported on 05/11/2015   Yes Historical Provider, MD   Allergies  Allergen Reactions  . Altace [Ramipril] Hives   Review of Systems  Unable to perform ROS: Intubated    Physical  Exam  General: Intubated. No distress. Does not open eyes and no movement noted during encounter.Marland Kitchen  HEENT: ET tube in place Respiratory: Ventilator support  Skin: Warm and dry Neuro: No movement appreciated during encounter   Vital Signs: BP 84/70 mmHg  Pulse 64  Temp(Src) 98 F (36.7 C) (Oral)  Resp 24  Ht 5' 10"  (1.778 m)  Wt 98.6 kg (217 lb 6 oz)  BMI 31.19 kg/m2  SpO2 99% Pain Assessment: CPOT       SpO2: SpO2: 99 % O2 Device:SpO2: 99 % O2 Flow Rate: .   IO: Intake/output summary:  Intake/Output Summary (Last 24 hours) at 2015-07-02 1322 Last data filed at 02-Jul-2015 1300  Gross per 24 hour  Intake 253.72 ml  Output    545 ml  Net -291.28 ml    LBM:   Baseline Weight: Weight: 73.029 kg (161 lb) Most recent weight: Weight: 98.6 kg (217 lb 6 oz)     Palliative Assessment/Data: PPS 10%   Flowsheet Rows        Most Recent Value   Intake Tab    Referral Department  Critical care   Unit at Time of Referral  ICU   Palliative Care Primary Diagnosis  Cardiac   Date Notified  06/10/15   Palliative Care Type  New Palliative care   Date of Admission  06/02/2015   Date first seen by Palliative Care  07/02/2015   # of days Palliative referral response time  1 Day(s)   # of days IP prior to Palliative referral  1   Clinical Assessment    Palliative Performance Scale Score  10%   Pain Max last 24 hours  Not able to report   Pain Min Last 24 hours  Not able to report   Psychosocial & Spiritual Assessment    Palliative Care Outcomes     Patient/Family meeting held?  Yes   Who was at the meeting?  Patient's 2 daughters   Palliative Care Outcomes  Clarified goals of care, Counseled regarding hospice, Transitioned to hospice, Changed to focus on comfort      Time In: 1200 Time Out: 1255 Time Total: 55 Greater than 50%  of this time was spent counseling and coordinating care related to the above assessment and plan.  Signed by: Micheline Rough, MD   Please contact Palliative Medicine Team phone at [phone] for questions and concerns.  For individual provider: See Shea Evans

## 2015-06-14 NOTE — Progress Notes (Signed)
Nutrition Brief Note  Chart reviewed. Patient is on ventilator support. Palliative Care Team and Chaplain Services have been consulted per family request. Discussed patient with RN. Patient is a limited code/DNR. Awaiting EEG, TEE, and potential repeat head CT results before deciding on final goals of care. No plans for nutrition support at this time. Please consult nutrition if needed.  Greta Yung, RD, LDNJoaquin Courts, CNSC Pager [phone] After Hours Pager [phone]

## 2015-06-14 NOTE — Progress Notes (Signed)
Pt extubated to "withdrawal of care" protocol. At 1600 pt went asystole and time of death noted by this RN and Holland CommonsMaria RN. Family at bedside. MD notified.

## 2015-06-14 NOTE — Progress Notes (Signed)
Bedside EEG completed, results pending. 

## 2015-06-14 DEATH — deceased

## 2015-07-14 NOTE — Procedures (Signed)
History: James Park is an 82 y.o. male patient with severe anoxic encephalopathy. Routine inpatient EEG was performed for further evaluation.   Patient Active Problem List   Diagnosis Date Noted  . Respiratory arrest (HCC) 2015/05/27  . Cardiac arrest (HCC) 2015/05/27  . Melena 06/30/2014  . Anemia 06/30/2014  . Heme + stool 06/30/2014  . Constipation 11/30/2013  . Loss of weight 11/30/2013  . Abnormal CT scan, colon 11/30/2013  . Pulmonary hypertension (HCC) 05/20/2013  . SSS (sick sinus syndrome) (HCC) 05/06/2013  . CKD (chronic kidney disease) stage 5, GFR less than 15 ml/min (HCC) 05/06/2013  . Essential hypertension, benign 06/11/2012  . Right bundle branch block   . Atrial fibrillation (HCC) 02/14/2011    No current facility-administered medications for this encounter.  Current outpatient prescriptions:  .  albuterol (PROVENTIL) (2.5 MG/3ML) 0.083% nebulizer solution, Take 2.5 mg by nebulization every 6 (six) hours as needed for wheezing or shortness of breath., Disp: , Rfl:  .  ALPRAZolam (XANAX) 0.25 MG tablet, Take 0.25 mg by mouth at bedtime as needed for sleep. , Disp: , Rfl:  .  calcium acetate (PHOSLO) 667 MG capsule, Take 667 mg by mouth 3 (three) times daily with meals. Pt said he only takes one tablet in the morning, Disp: , Rfl:  .  doxazosin (CARDURA) 1 MG tablet, Take 1 mg by mouth daily., Disp: , Rfl:  .  levofloxacin (LEVAQUIN) 500 MG tablet, Take 500 mg by mouth every other day., Disp: , Rfl:  .  Melatonin 5 MG TABS, Take 5 mg by mouth at bedtime as needed (Sleep)., Disp: , Rfl:  .  pantoprazole (PROTONIX) 40 MG tablet, Take 40 mg by mouth daily., Disp: , Rfl:  .  sodium bicarbonate 650 MG tablet, Take 650 mg by mouth daily. Reported on 05/11/2015, Disp: , Rfl:    Introduction:  This is a 19 channel routine scalp EEG performed at the bedside with bipolar and monopolar montages arranged in accordance to the international 10/20 system of electrode placement.  One channel was dedicated to EKG recording.   Findings:  There is no identifiable cerebral activity seen. Only muscle artifacts are noted. No evidence of seizures noted.   Impression:  No identifiable cortical activity noted on this EEG.  Clinical correlation is recommended .

## 2015-07-14 DEATH — deceased

## 2017-07-19 IMAGING — MR MR HEAD W/O CM
9 of 10 series · 34 of 48 positions shown · non-contrast
Comparison: 06/09/2015 CT.  06/10/2012 MR.

CLINICAL DATA: 86-year-old hypertensive male with chronic kidney
disease presenting with unresponsiveness. Post CPR. Subsequent
encounter.

EXAM:
MRI HEAD WITHOUT CONTRAST
TECHNIQUE: Multiplanar, multiecho pulse sequences of the brain and surrounding
structures were obtained without intravenous contrast.

[Series 3: T1 · sagittal · 5.0mm · 0.47mm/px · 1 of 25 slices shown]
[im 1/25]
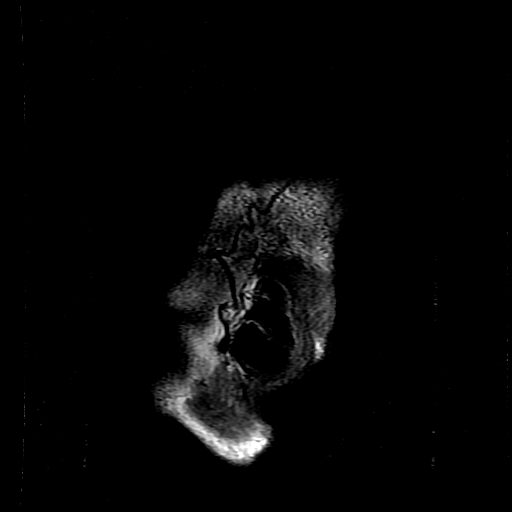

[Series 4: DWI · axial · 3.0mm · 1.09mm/px · z∈[+41,+188]mm · 8 of 102 slices shown (1 of 4)]
[im 1/102]
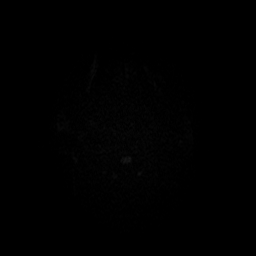
[im 12/102]
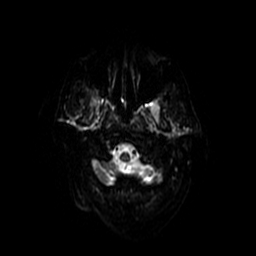
[im 34/102]
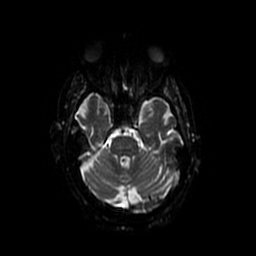
[im 45/102]
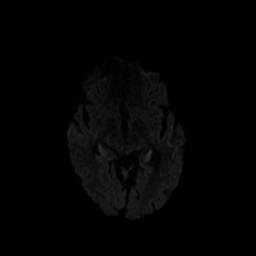
[im 57/102]
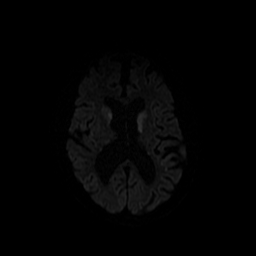
[im 68/102]
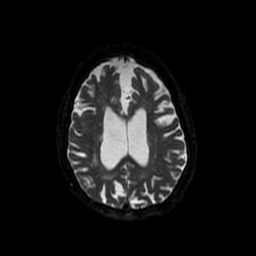
[im 90/102]
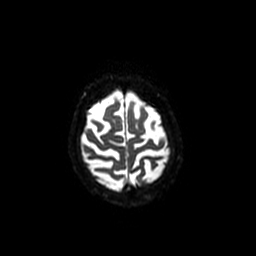
[im 102/102]
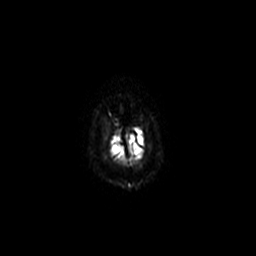

[Series 5: DWI · coronal · 5.0mm · 1.09mm/px · 7 of 68 slices shown (2 of 4)]
[im 1/68]
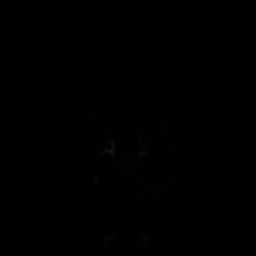
[im 12/68]
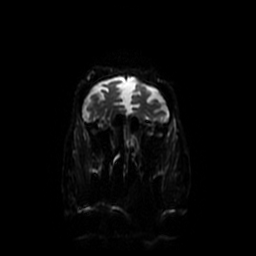
[im 23/68]
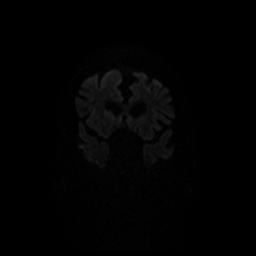
[im 34/68]
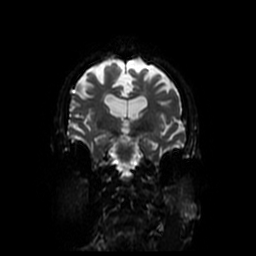
[im 45/68]
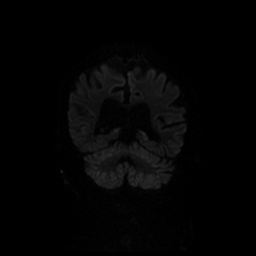
[im 56/68]
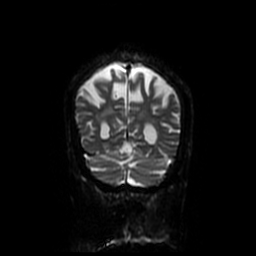
[im 68/68]
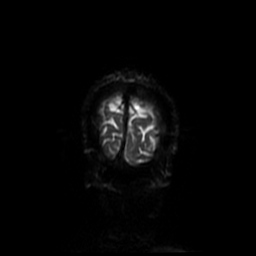

[Series 6: T2 · axial · 5.0mm · 0.47mm/px · z∈[+37,+184]mm · 3 of 26 slices shown (1 of 2)]
[im 1/26]
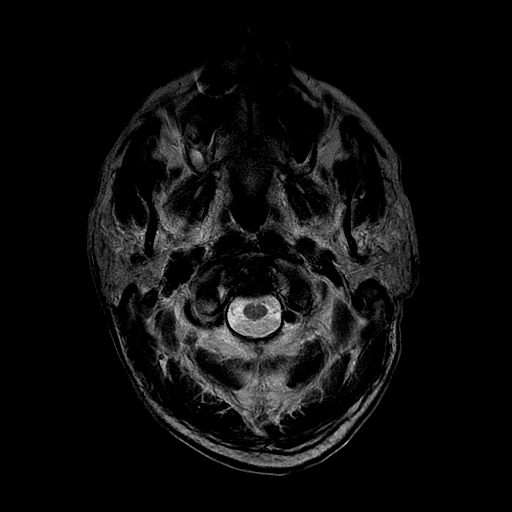
[im 13/26]
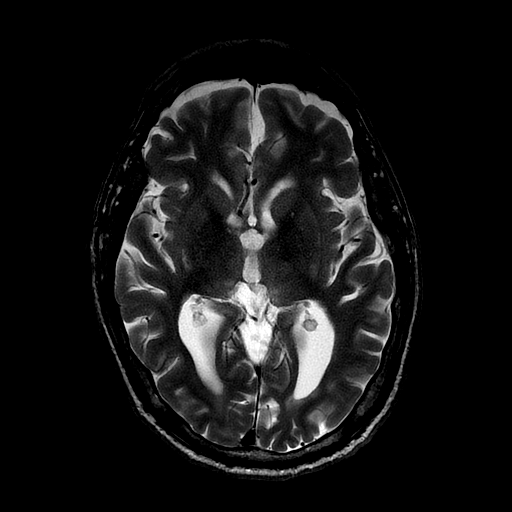
[im 26/26]
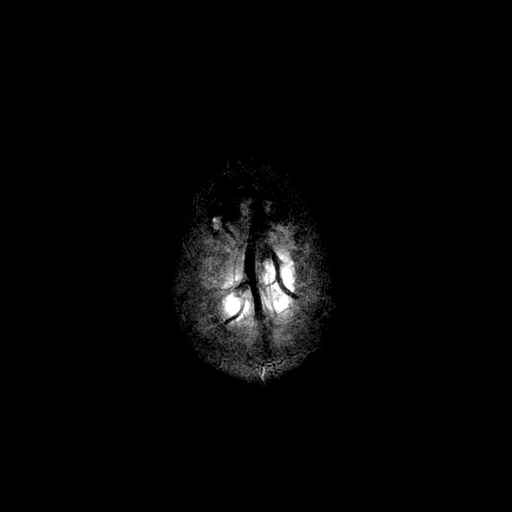

[Series 7: FLAIR · axial · 5.0mm · 0.47mm/px · z∈[+37,+184]mm · 3 of 26 slices shown]
[im 1/26]
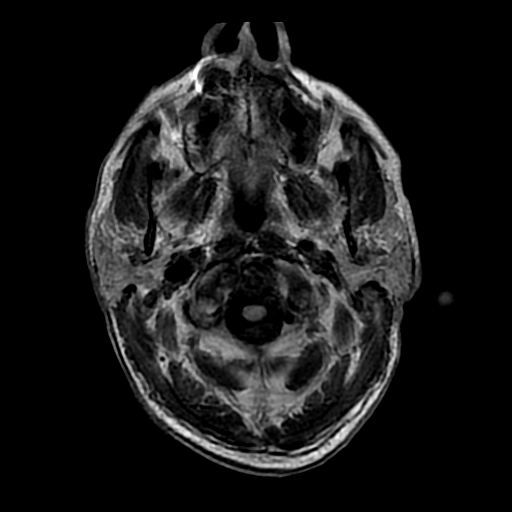
[im 13/26]
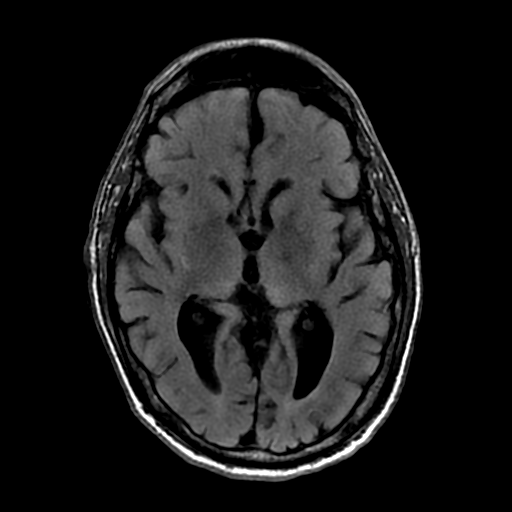
[im 26/26]
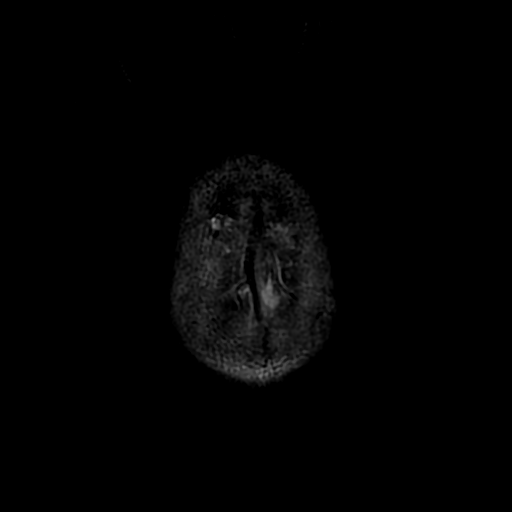

[Series 8: ax mpgr · axial · 5.0mm · 0.47mm/px · 1 of 26 slices shown]
[im 1/26]
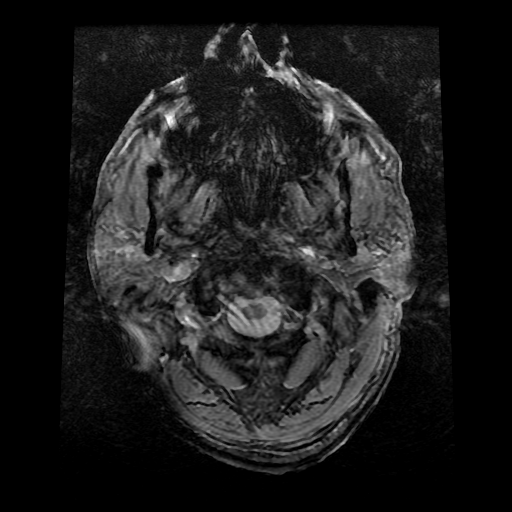

[Series 10: T2 · coronal · 5.0mm · 0.43mm/px · 3 of 29 slices shown (2 of 2)]
[im 1/29]
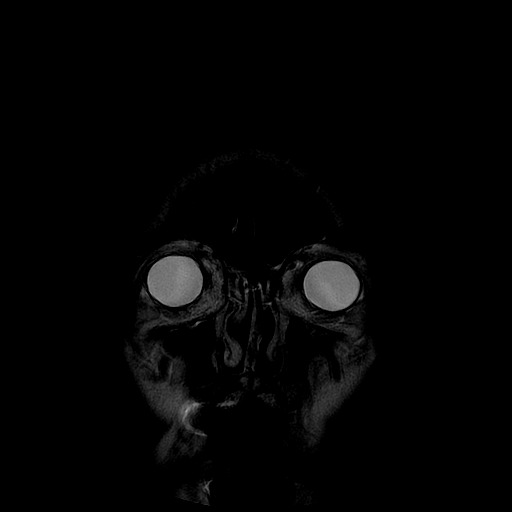
[im 15/29]
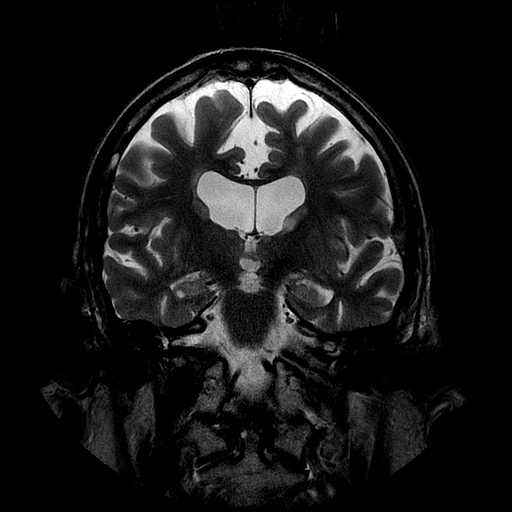
[im 29/29]
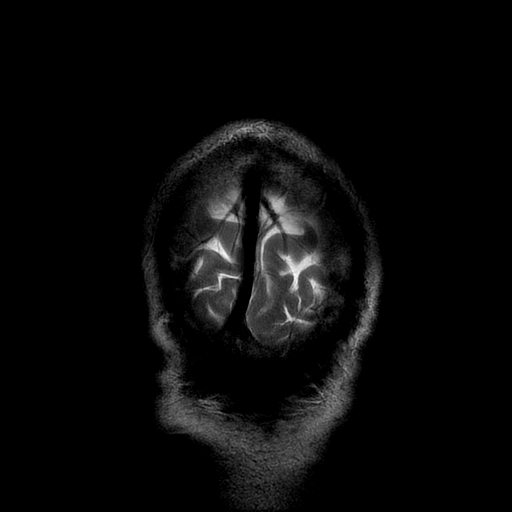

[Series 400: DWI · axial · 3.0mm · 1.09mm/px · z∈[+41,+188]mm · 5 of 51 slices shown (3 of 4)]
[im 1/51]
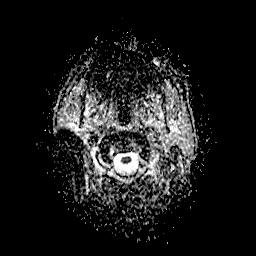
[im 13/51]
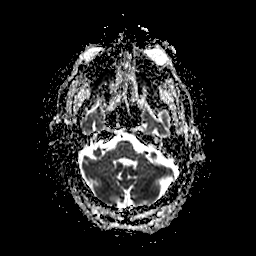
[im 26/51]
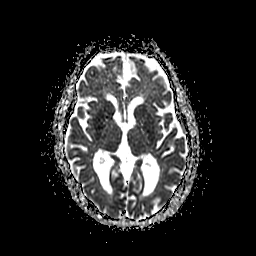
[im 38/51]
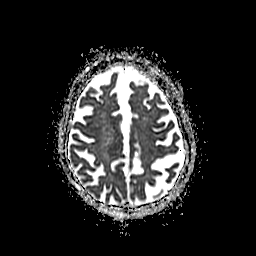
[im 51/51]
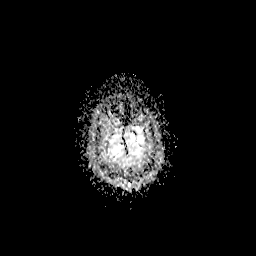

[Series 500: DWI · coronal · 5.0mm · 1.09mm/px · 3 of 34 slices shown (4 of 4)]
[im 1/34]
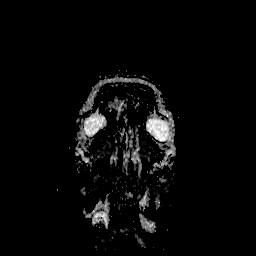
[im 17/34]
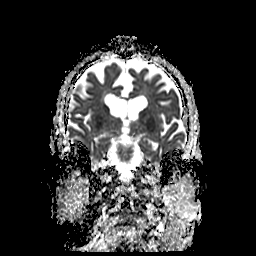
[im 34/34]
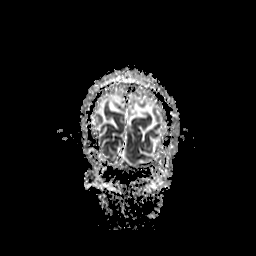

[34 of 48 positions shown; findings below may reference images not displayed]

FINDINGS: Fairly symmetric restricted motion throughout the caudate
bilaterally, thalamus bilaterally and involving the hippocampus
bilaterally as well as posterior aspect of the lenticular nucleus
greater on the right. Small areas of restricted motion posterior
left operculum region. In the present clinical setting of prolonged
CPR, findings most likely represent result of hypoxia. Similar
findings can be seen in the setting of Zinnia disease and
hypoglycemia/hyperglycemia.

Global atrophy without hydrocephalus.

No intracranial hemorrhage.

No intracranial mass lesion noted on this unenhanced exam.

Major intracranial vascular structures are patent.

Paranasal sinus mucosal thickening partial opacification with
air-fluid levels maxillary sinuses, ethmoid sinus air cells and
sphenoid sinuses suggestive of acute sinusitis.

Post lens replacement.  Mild exophthalmos.

Mild spinal stenosis C3-4.
IMPRESSION: Fairly symmetric restricted motion throughout the caudate
bilaterally, thalamus bilaterally and involving the hippocampus
bilaterally as well as posterior aspect of the lenticular nucleus
greater on the right. Small areas of restricted motion posterior
left operculum region. In the present clinical setting of prolonged
CPR, findings most likely represent result of hypoxia. Similar
findings can be seen in the setting of Zinnia disease and
hypoglycemia/hyperglycemia. Acute infectious process felt less
likely consideration.

Global atrophy without hydrocephalus.

Paranasal sinus mucosal thickening partial opacification with
air-fluid levels maxillary sinuses, ethmoid sinus air cells and
sphenoid sinuses suggestive of acute sinusitis.

Post lens replacement. Mild exophthalmos.

Mild spinal stenosis C3-4.

## 2017-07-19 IMAGING — CR DG CHEST 1V PORT
1 series · 1 of 1 positions shown · non-contrast
Comparison: Chest radiograph 06/09/2015.

CLINICAL DATA: ET tube placement.  Respiratory failure.

EXAM:
PORTABLE CHEST 1 VIEW

[AP]
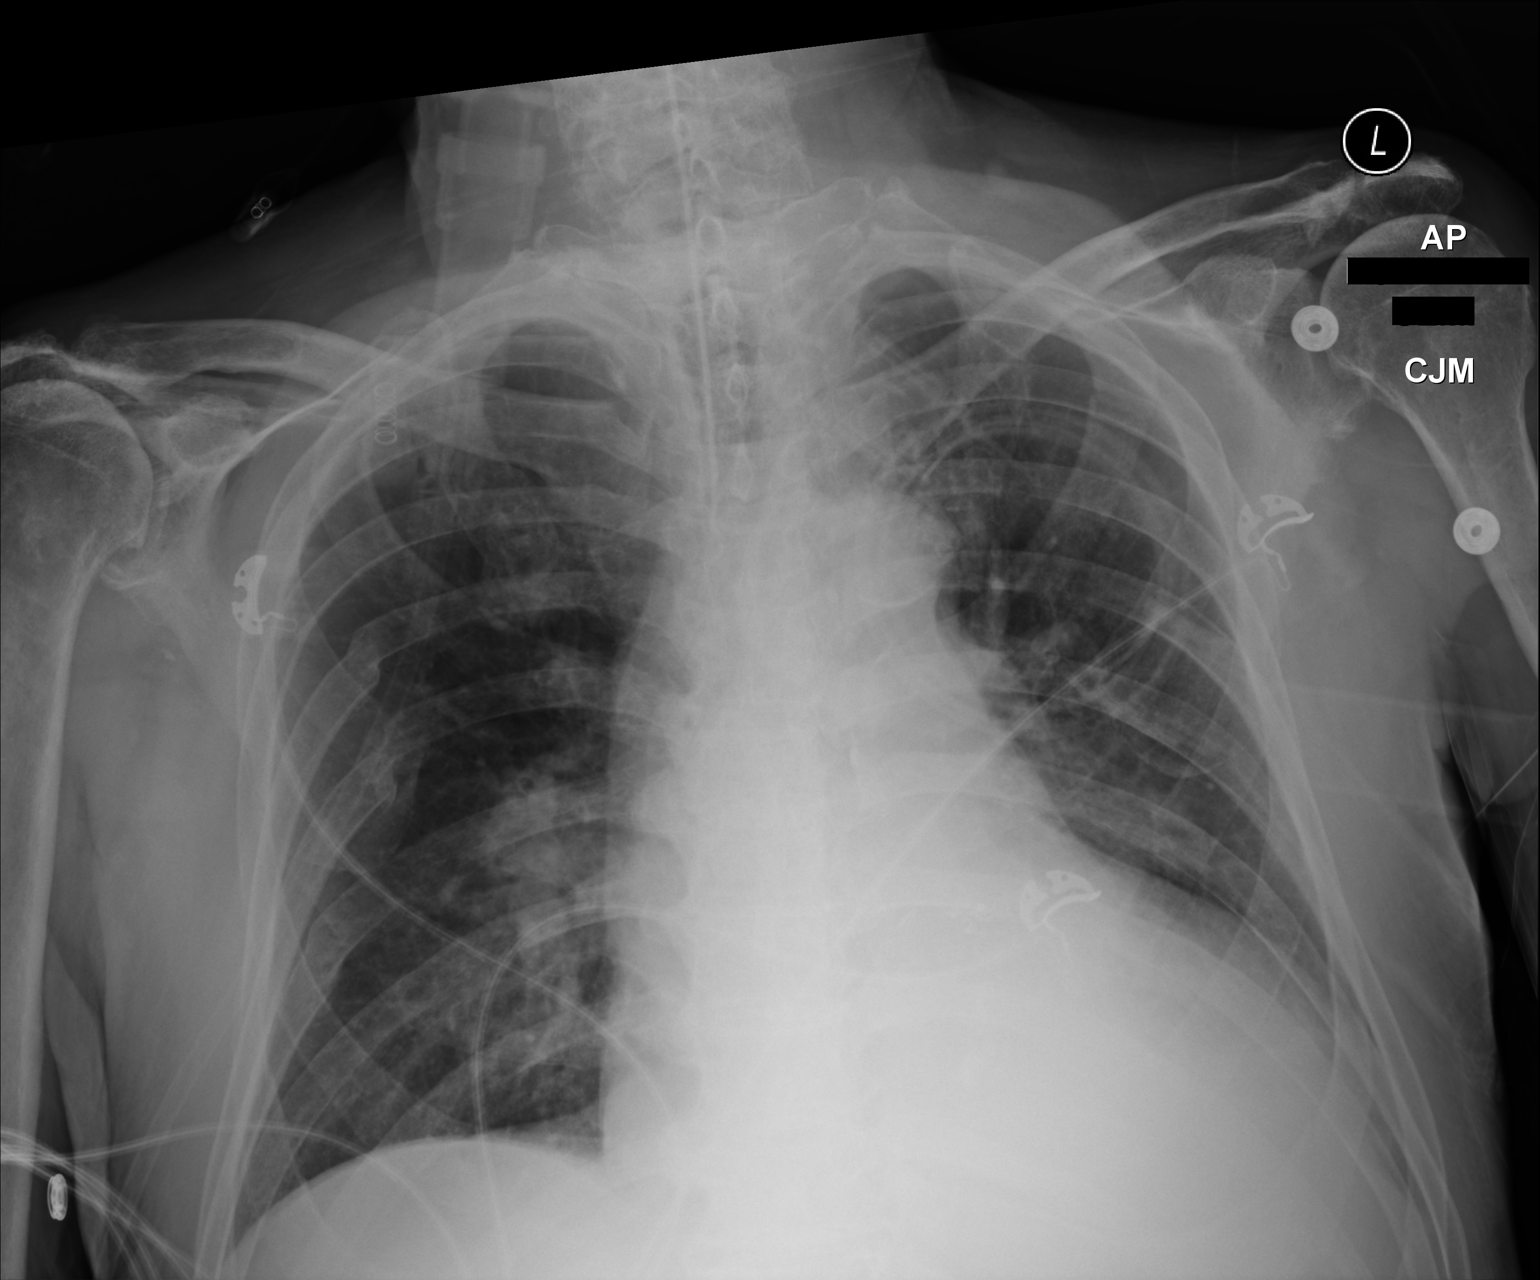

[1 of 1 positions shown; findings below may reference images not displayed]

FINDINGS: ET tube terminates in the mid trachea. Monitoring leads overlie the
patient. Stable enlarged cardiac and mediastinal contours. Minimal
heterogeneous opacities left lung base. No pleural effusion or
pneumothorax. Multiple old right rib fractures. Bilateral shoulder
joint degenerative changes.
IMPRESSION: ET tube terminates in the mid trachea.

Heterogeneous opacities left lung base may represent atelectasis or
infection.

## 2017-07-19 IMAGING — CR DG CHEST 1V PORT
2 series · 2 of 2 positions shown · non-contrast
Comparison: 11/02/2014.

CLINICAL DATA: Intubated. Weakness. Atrial fibrillation. End-stage
renal disease.

EXAM:
PORTABLE CHEST 1 VIEW

[ap portable (1 of 2)]
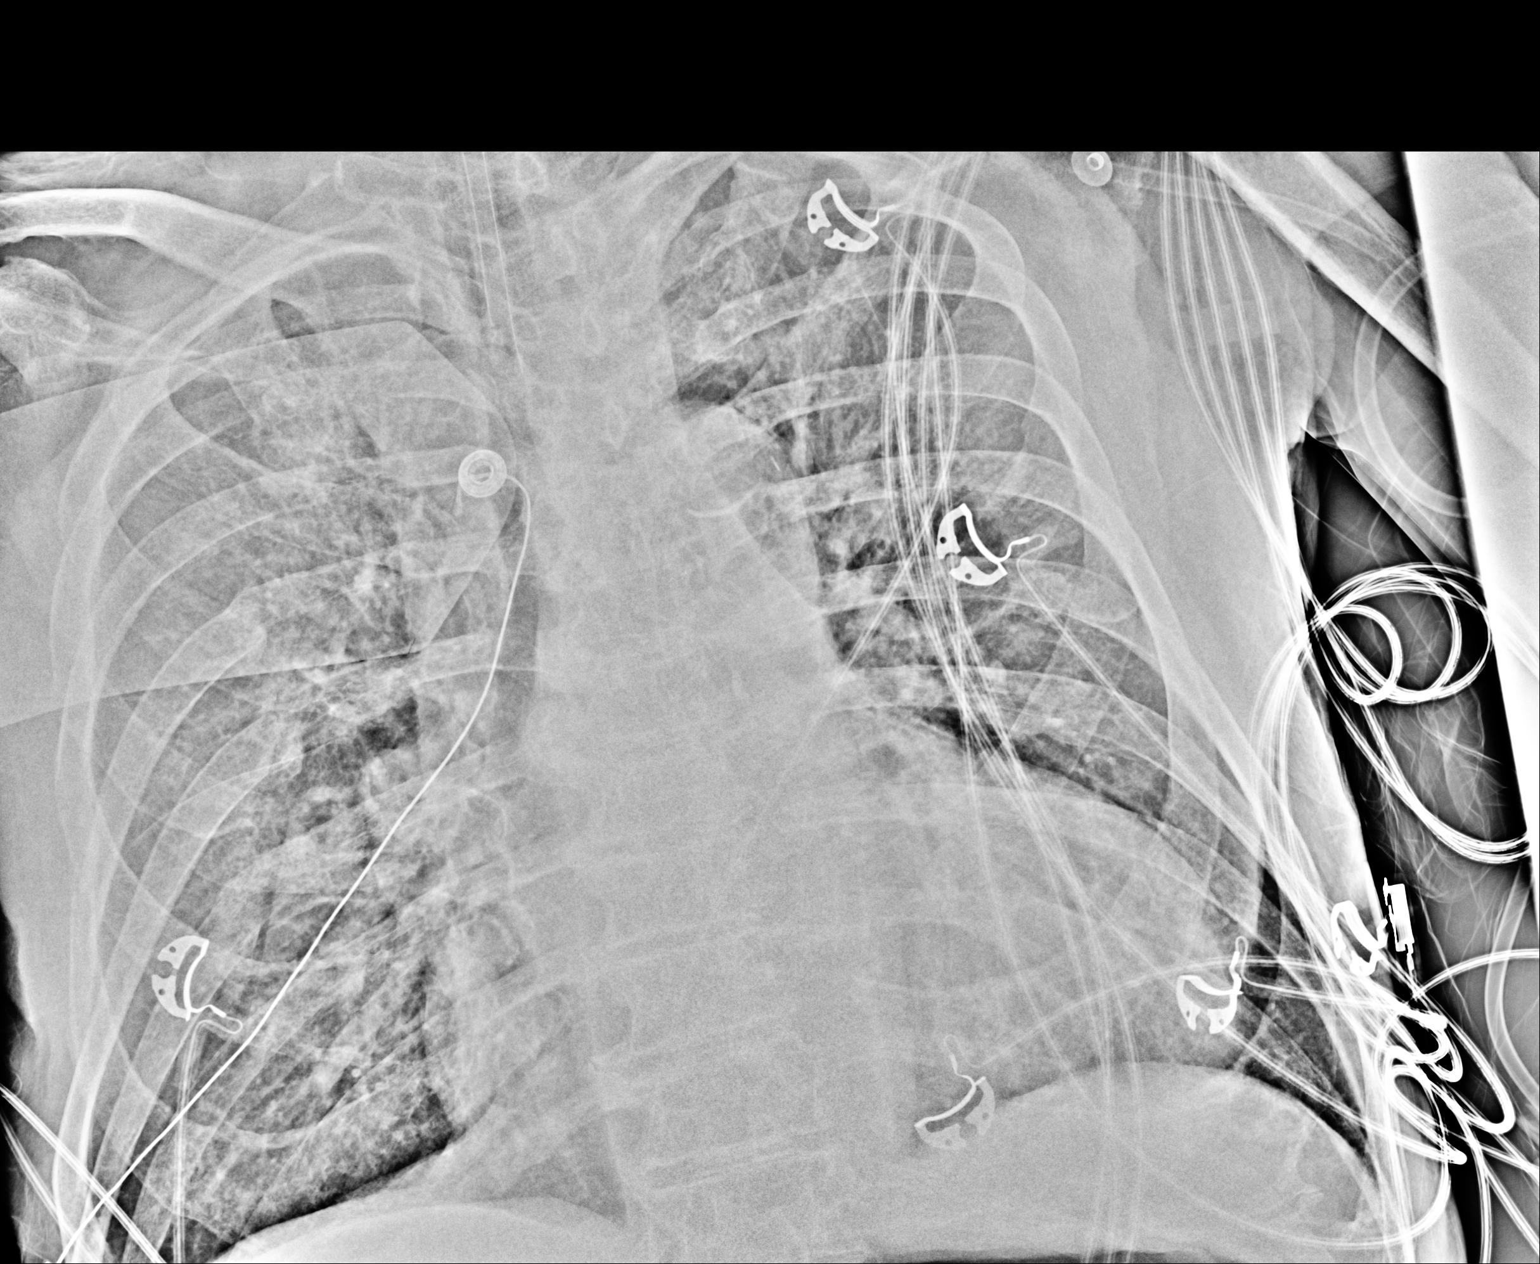

[ap portable (2 of 2)]
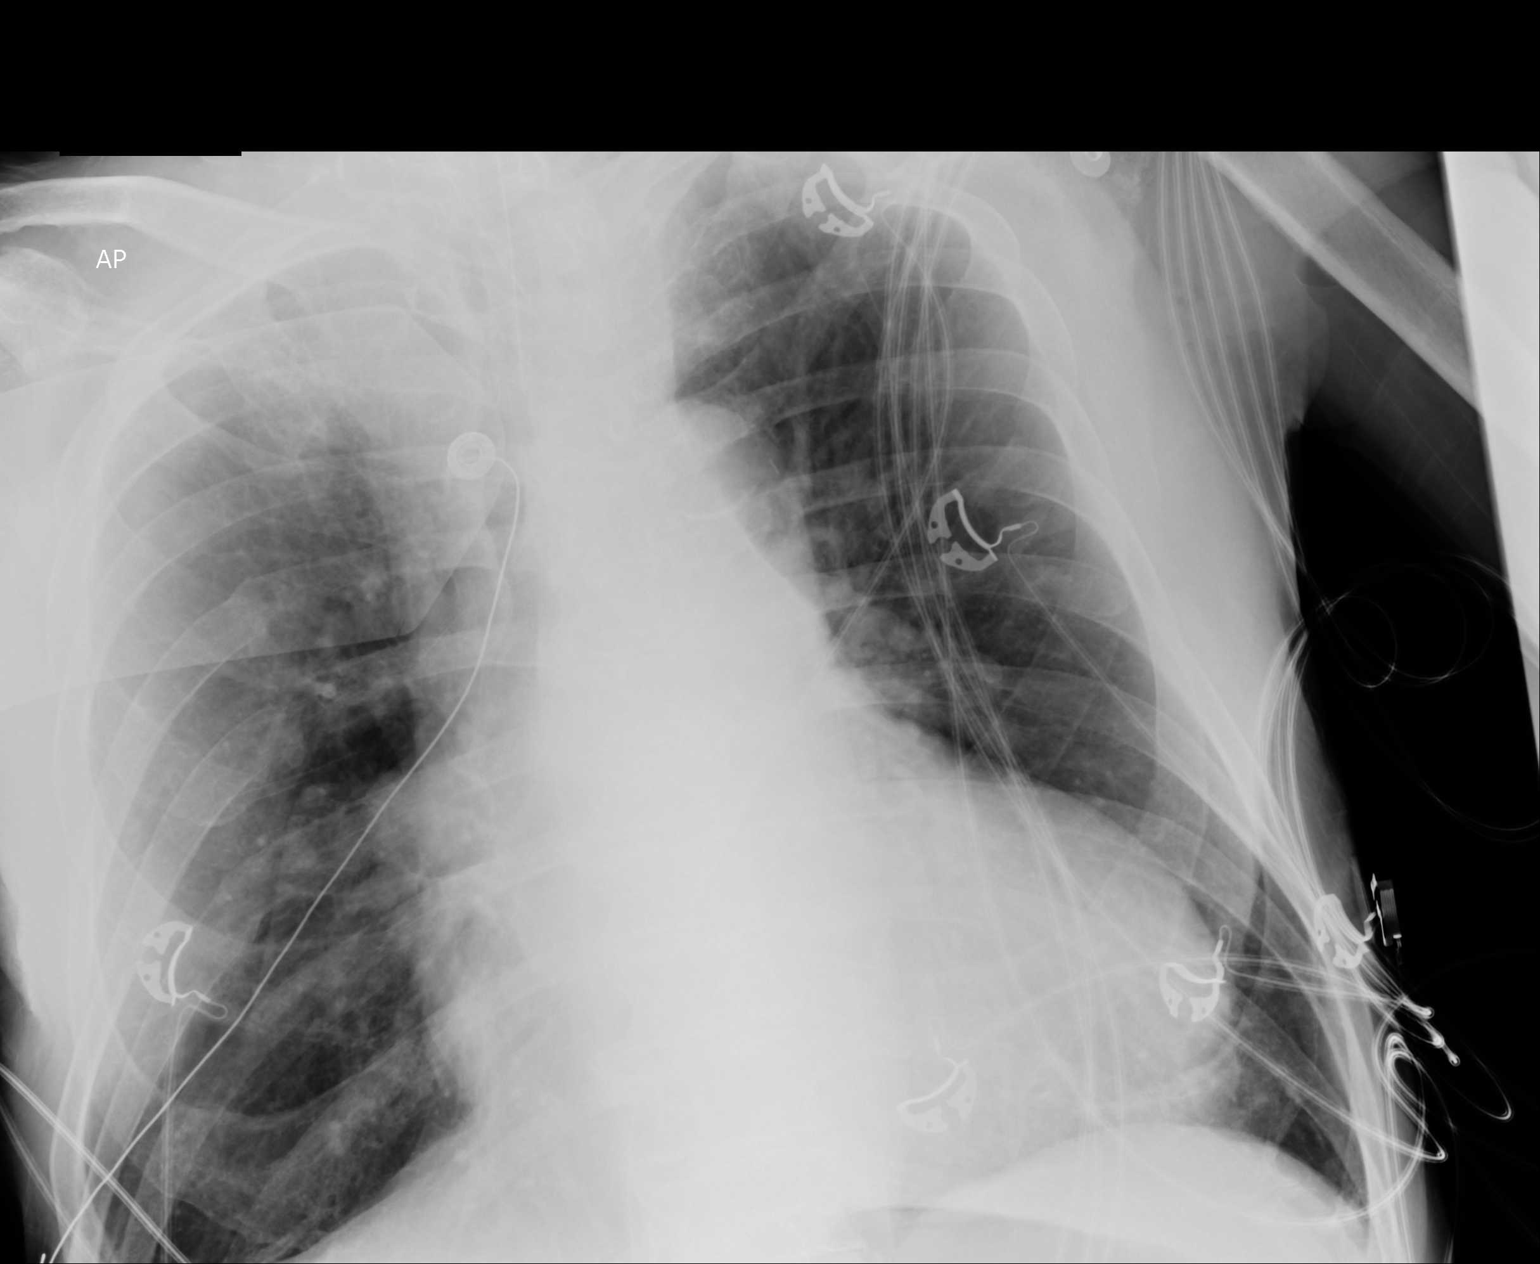

[2 of 2 positions shown; findings below may reference images not displayed]

FINDINGS: Enlarged cardiac silhouette without significant change. Interval
endotracheal tube in satisfactory position. Clear lungs. The right
lateral lung base is not included. Old, healed right rib fractures.
IMPRESSION: 1. No acute abnormality.
2. Stable cardiomegaly.
